# Patient Record
Sex: Female | Born: 1960 | Race: Black or African American | Hispanic: No | Marital: Married | Smoking: Never smoker
Health system: Southern US, Community
[De-identification: ages and names within clinical notes are randomized; demographics above are authoritative.]

## PROBLEM LIST (undated history)

## (undated) DIAGNOSIS — E119 Type 2 diabetes mellitus without complications: Secondary | ICD-10-CM

## (undated) DIAGNOSIS — E669 Obesity, unspecified: Secondary | ICD-10-CM

## (undated) DIAGNOSIS — R591 Generalized enlarged lymph nodes: Secondary | ICD-10-CM

## (undated) DIAGNOSIS — F419 Anxiety disorder, unspecified: Secondary | ICD-10-CM

## (undated) DIAGNOSIS — N926 Irregular menstruation, unspecified: Secondary | ICD-10-CM

## (undated) DIAGNOSIS — J189 Pneumonia, unspecified organism: Secondary | ICD-10-CM

## (undated) DIAGNOSIS — I10 Essential (primary) hypertension: Secondary | ICD-10-CM

## (undated) DIAGNOSIS — J342 Deviated nasal septum: Secondary | ICD-10-CM

## (undated) DIAGNOSIS — E785 Hyperlipidemia, unspecified: Secondary | ICD-10-CM

## (undated) DIAGNOSIS — N951 Menopausal and female climacteric states: Secondary | ICD-10-CM

## (undated) DIAGNOSIS — T7840XA Allergy, unspecified, initial encounter: Secondary | ICD-10-CM

## (undated) DIAGNOSIS — M1990 Unspecified osteoarthritis, unspecified site: Secondary | ICD-10-CM

## (undated) DIAGNOSIS — J45909 Unspecified asthma, uncomplicated: Secondary | ICD-10-CM

## (undated) HISTORY — DX: Obesity, unspecified: E66.9

## (undated) HISTORY — DX: Unspecified asthma, uncomplicated: J45.909

## (undated) HISTORY — DX: Anxiety disorder, unspecified: F41.9

## (undated) HISTORY — DX: Unspecified osteoarthritis, unspecified site: M19.90

## (undated) HISTORY — PX: DILATION AND CURETTAGE OF UTERUS: SHX78

## (undated) HISTORY — DX: Generalized enlarged lymph nodes: R59.1

## (undated) HISTORY — PX: CHOLECYSTECTOMY: SHX55

## (undated) HISTORY — DX: Irregular menstruation, unspecified: N92.6

## (undated) HISTORY — PX: BREAST SURGERY: SHX581

## (undated) HISTORY — DX: Hyperlipidemia, unspecified: E78.5

## (undated) HISTORY — DX: Menopausal and female climacteric states: N95.1

## (undated) HISTORY — PX: JOINT REPLACEMENT: SHX530

## (undated) HISTORY — DX: Deviated nasal septum: J34.2

## (undated) HISTORY — DX: Essential (primary) hypertension: I10

## (undated) HISTORY — PX: TONSILLECTOMY: SUR1361

## (undated) HISTORY — DX: Type 2 diabetes mellitus without complications: E11.9

## (undated) HISTORY — DX: Allergy, unspecified, initial encounter: T78.40XA

---

## 2005-09-29 ENCOUNTER — Ambulatory Visit: Payer: Self-pay | Admitting: Internal Medicine

## 2006-09-23 ENCOUNTER — Ambulatory Visit: Payer: Self-pay | Admitting: Obstetrics and Gynecology

## 2007-04-29 ENCOUNTER — Other Ambulatory Visit: Payer: Self-pay

## 2007-04-29 ENCOUNTER — Emergency Department: Payer: Self-pay | Admitting: Emergency Medicine

## 2009-02-16 ENCOUNTER — Inpatient Hospital Stay: Payer: Self-pay | Admitting: Internal Medicine

## 2009-05-09 ENCOUNTER — Ambulatory Visit: Payer: Self-pay | Admitting: Family Medicine

## 2009-10-06 ENCOUNTER — Ambulatory Visit: Payer: Self-pay | Admitting: Family Medicine

## 2009-11-18 ENCOUNTER — Ambulatory Visit: Payer: Self-pay | Admitting: Family Medicine

## 2010-02-25 NOTE — Assessment & Plan Note (Signed)
Summary: FLU SHOT/EVM   Vital Signs:  Patient Profile:   50 Years Old Female CC:      flu shot Temp:     98.6 degrees F oral                  Current Allergies (reviewed today): ! * SHELLFISHHistory of Present Illness Referral source: Dr.Soles  History from: patient Reason for visit: flu shot Chief Complaint: flu shot History of Present Illness: PCP: Dr.Soles at [facility]     Past History:  Past Medical History: Hypertension  Past Surgical History: Caesarean section:  1987 Caesarean section: 1989 Breast cyst : 1975  Family History: Father:  Mother:  Siblings:   Allergies (verified): 1)  ! * Shellfish  Assessment New Problems: HYPERTENSION (ICD-401.9)   Plan New Orders: Flu Vaccine 73yrs + [90658] Admin 1st Vaccine [90471] Admin 1st Vaccine [facility]) [phone]  The patient and/or caregiver has been counseled thoroughly with regard to medications prescribed including dosage, schedule, interactions, rationale for use, and possible side effects and they verbalize understanding.  Diagnoses and expected course of recovery discussed and will return if not improved as expected or if the condition worsens. Patient and/or caregiver verbalized understanding.   Orders Added: 1)  Flu Vaccine 27yrs + [90658] 2)  Admin 1st Vaccine [90471] 3)  Admin 1st Vaccine [facility]) [90471S]   Influenza Vaccine    Vaccine Type: Fluvax 3+    Site: left deltoid    Mfr: GlaxoSmithKline    Dose: 0.5 ml    Route: IM    Given by: Providence Crosby LPN    Exp. Date: 05/2010    Lot #: [id number]    VIS given: 08/20/09 version given October 06, 2009.  Flu Vaccine Consent Questions    Do you have a history of severe allergic reactions to this vaccine? no    Any prior history of allergic reactions to egg and/or gelatin? no    Do you have a sensitivity to the preservative Thimersol? no    Do you have a past history of Guillan-Barre Syndrome? no    Do you currently have an acute febrile  illness? no    Have you ever had a severe reaction to latex? no    Vaccine information given and explained to patient? yes    Are you currently pregnant? no

## 2010-11-06 DIAGNOSIS — E1165 Type 2 diabetes mellitus with hyperglycemia: Secondary | ICD-10-CM | POA: Insufficient documentation

## 2010-11-06 DIAGNOSIS — E1122 Type 2 diabetes mellitus with diabetic chronic kidney disease: Secondary | ICD-10-CM | POA: Insufficient documentation

## 2010-11-06 DIAGNOSIS — E1129 Type 2 diabetes mellitus with other diabetic kidney complication: Secondary | ICD-10-CM | POA: Insufficient documentation

## 2011-12-01 ENCOUNTER — Ambulatory Visit: Payer: Self-pay | Admitting: Family Medicine

## 2011-12-01 LAB — HM MAMMOGRAPHY: HM Mammogram: NORMAL

## 2012-05-27 LAB — HM PAP SMEAR: HM Pap smear: NORMAL

## 2013-01-26 DIAGNOSIS — J189 Pneumonia, unspecified organism: Secondary | ICD-10-CM

## 2013-01-26 HISTORY — DX: Pneumonia, unspecified organism: J18.9

## 2014-02-20 LAB — LIPID PANEL
Cholesterol: 258 mg/dL — AB (ref 0–200)
HDL: 40 mg/dL (ref 35–70)
LDL Cholesterol: 185 mg/dL
Triglycerides: 163 mg/dL — AB (ref 40–160)

## 2014-02-20 LAB — HEMOGLOBIN A1C: Hgb A1c MFr Bld: 6.9 % — AB (ref 4.0–6.0)

## 2014-08-13 ENCOUNTER — Encounter: Payer: Self-pay | Admitting: Family Medicine

## 2014-08-13 DIAGNOSIS — I10 Essential (primary) hypertension: Secondary | ICD-10-CM | POA: Insufficient documentation

## 2014-08-13 DIAGNOSIS — Z6841 Body Mass Index (BMI) 40.0 and over, adult: Secondary | ICD-10-CM | POA: Insufficient documentation

## 2014-08-13 DIAGNOSIS — Z8709 Personal history of other diseases of the respiratory system: Secondary | ICD-10-CM | POA: Insufficient documentation

## 2014-08-13 DIAGNOSIS — M1711 Unilateral primary osteoarthritis, right knee: Secondary | ICD-10-CM | POA: Insufficient documentation

## 2014-08-13 DIAGNOSIS — J302 Other seasonal allergic rhinitis: Secondary | ICD-10-CM | POA: Insufficient documentation

## 2014-08-13 DIAGNOSIS — I1A0 Resistant hypertension: Secondary | ICD-10-CM | POA: Insufficient documentation

## 2014-08-13 DIAGNOSIS — E785 Hyperlipidemia, unspecified: Secondary | ICD-10-CM | POA: Insufficient documentation

## 2014-08-13 DIAGNOSIS — E049 Nontoxic goiter, unspecified: Secondary | ICD-10-CM | POA: Insufficient documentation

## 2014-08-13 DIAGNOSIS — R809 Proteinuria, unspecified: Secondary | ICD-10-CM | POA: Insufficient documentation

## 2014-08-13 HISTORY — DX: Personal history of other diseases of the respiratory system: Z87.09

## 2014-08-14 ENCOUNTER — Ambulatory Visit (INDEPENDENT_AMBULATORY_CARE_PROVIDER_SITE_OTHER): Payer: 59 | Admitting: Family Medicine

## 2014-08-14 ENCOUNTER — Encounter (INDEPENDENT_AMBULATORY_CARE_PROVIDER_SITE_OTHER): Payer: Self-pay

## 2014-08-14 ENCOUNTER — Encounter: Payer: Self-pay | Admitting: Family Medicine

## 2014-08-14 VITALS — BP 142/82 | HR 90 | Temp 97.9°F | Resp 16 | Ht 65.0 in | Wt 290.4 lb

## 2014-08-14 DIAGNOSIS — I10 Essential (primary) hypertension: Secondary | ICD-10-CM

## 2014-08-14 DIAGNOSIS — J3089 Other allergic rhinitis: Secondary | ICD-10-CM

## 2014-08-14 DIAGNOSIS — R350 Frequency of micturition: Secondary | ICD-10-CM

## 2014-08-14 DIAGNOSIS — Z139 Encounter for screening, unspecified: Secondary | ICD-10-CM | POA: Diagnosis not present

## 2014-08-14 DIAGNOSIS — Z1211 Encounter for screening for malignant neoplasm of colon: Secondary | ICD-10-CM

## 2014-08-14 DIAGNOSIS — Z1239 Encounter for other screening for malignant neoplasm of breast: Secondary | ICD-10-CM

## 2014-08-14 DIAGNOSIS — R232 Flushing: Secondary | ICD-10-CM

## 2014-08-14 DIAGNOSIS — M797 Fibromyalgia: Secondary | ICD-10-CM

## 2014-08-14 DIAGNOSIS — E1129 Type 2 diabetes mellitus with other diabetic kidney complication: Secondary | ICD-10-CM | POA: Diagnosis not present

## 2014-08-14 DIAGNOSIS — E785 Hyperlipidemia, unspecified: Secondary | ICD-10-CM

## 2014-08-14 DIAGNOSIS — N951 Menopausal and female climacteric states: Secondary | ICD-10-CM | POA: Diagnosis not present

## 2014-08-14 DIAGNOSIS — R809 Proteinuria, unspecified: Secondary | ICD-10-CM

## 2014-08-14 LAB — POCT URINALYSIS DIPSTICK
BILIRUBIN UA: NEGATIVE
GLUCOSE UA: NEGATIVE
KETONES UA: NEGATIVE
LEUKOCYTES UA: NEGATIVE
Nitrite, UA: NEGATIVE
PH UA: 5
Protein, UA: 2000
Spec Grav, UA: 1.01
Urobilinogen, UA: 0.2

## 2014-08-14 LAB — POCT GLYCOSYLATED HEMOGLOBIN (HGB A1C): HEMOGLOBIN A1C: 6.4

## 2014-08-14 LAB — POCT UA - MICROALBUMIN: Microalbumin Ur, POC: 100 mg/L

## 2014-08-14 MED ORDER — METFORMIN HCL ER 500 MG PO TB24: 500.0000 mg | ORAL_TABLET | Freq: Every day | ORAL | Status: DC

## 2014-08-14 MED ORDER — METOPROLOL TARTRATE 50 MG PO TABS
50.0000 mg | ORAL_TABLET | Freq: Every day | ORAL | Status: DC
Start: 2014-08-14 — End: 2014-08-14

## 2014-08-14 MED ORDER — MONTELUKAST SODIUM 10 MG PO TABS
10.0000 mg | ORAL_TABLET | Freq: Every day | ORAL | Status: DC
Start: 2014-08-14 — End: 2015-05-15

## 2014-08-14 MED ORDER — DULOXETINE HCL 30 MG PO CPEP: 30.0000 mg | ORAL_CAPSULE | Freq: Every day | ORAL | Status: DC

## 2014-08-14 MED ORDER — OLMESARTAN-AMLODIPINE-HCTZ 40-10-25 MG PO TABS: 1.0000 | ORAL_TABLET | Freq: Every day | ORAL | Status: DC

## 2014-08-14 MED ORDER — ATORVASTATIN CALCIUM 40 MG PO TABS: 40.0000 mg | ORAL_TABLET | Freq: Every day | ORAL | Status: DC

## 2014-08-14 MED ORDER — METOPROLOL TARTRATE 50 MG PO TABS
50.0000 mg | ORAL_TABLET | Freq: Every day | ORAL | Status: DC
Start: 2014-08-14 — End: 2015-05-15

## 2014-08-14 MED ORDER — COENZYME Q10 100 MG PO CAPS: 100.0000 mg | ORAL_CAPSULE | Freq: Every day | ORAL | Status: DC

## 2014-08-14 MED ORDER — CIPROFLOXACIN HCL 250 MG PO TABS: 250.0000 mg | ORAL_TABLET | Freq: Two times a day (BID) | ORAL | Status: DC

## 2014-08-14 NOTE — Progress Notes (Signed)
Name: Claire Hansen   MRN: [id number]    DOB: 1960/02/19   Date:08/14/2014       Progress Note  Subjective  Chief Complaint  Chief Complaint  Patient presents with  . Medication Refill    6 month F/U  . Diabetes  . Hypertension    she stopped several bp meds due to when she checks bp being low and feeling dizzy only curently taking tribenzor  . Hyperlipidemia  . Urinary Tract Infection    frequent and burning pain when urinating onset 2 days    HPI  DMII: she is taking Metformin 500 mg ER, she has lost 4 lbs , glucose has improved , hgbA1C 6.4% now, continue current medication regiment. Denies polyphagia, polyuria or polydipsia.   HTN: she stopped taking Spironolactone because her cycles were irregular, she states she was also feeling dizzy. She is taking Tribenzor in am and Metoprolol at night, bp at home has been at goal around 130/80's  Hyperlipidemia: she stopped taking  Lipitor because of body aches.   Hot Flashes: night sweats, hot flashes and irregular cycles and mood swings  Patient Active Problem List   Diagnosis Date Noted  . Fibromyalgia syndrome 08/14/2014  . Anxiety state 08/13/2014  . Dyslipidemia 08/13/2014  . Deviated nasal septum 08/13/2014  . Goiter 08/13/2014  . History of asthma 08/13/2014  . Benign hypertension 08/13/2014  . Irregular bleeding 08/13/2014  . Microalbuminuria 08/13/2014  . Excess, menstruation 08/13/2014  . Obesity (BMI 30-39.9) 08/13/2014  . Osteoarthritis of right knee 08/13/2014  . Allergic rhinitis, seasonal 08/13/2014  . Female climacteric state 08/13/2014  . Diabetes mellitus with renal manifestation 11/06/2010    Past Surgical History  Procedure Laterality Date  . Breast surgery Right     Cyst removed  . Cesarean section      x2  . Dilation and curettage of uterus      Treatment of Metorrhagia and Endometrial Ablation  . Tonsillectomy    . Cholecystectomy      Family History  Problem Relation Age of Onset  .  Asthma Son     History   Social History  . Marital Status: Married    Spouse Name: N/A  . Number of Children: N/A  . Years of Education: N/A   Occupational History  . Not on file.   Social History Main Topics  . Smoking status: Never Smoker   . Smokeless tobacco: Never Used  . Alcohol Use: No  . Drug Use: No  . Sexual Activity:    Partners: Male   Other Topics Concern  . Not on file   Social History Narrative     Current outpatient prescriptions:  .  acetaminophen (TYLENOL) 500 MG tablet, Take 1 tablet by mouth 3 (three) times daily., Disp: , Rfl:  .  aspirin 81 MG tablet, Take 1 tablet by mouth daily., Disp: , Rfl:  .  atorvastatin (LIPITOR) 40 MG tablet, Take 1 tablet (40 mg total) by mouth daily., Disp: 90 tablet, Rfl: 1 .  Loratadine (CLARITIN) 10 MG CAPS, Take 1 capsule by mouth daily., Disp: , Rfl:  .  montelukast (SINGULAIR) 10 MG tablet, Take 1 tablet (10 mg total) by mouth daily., Disp: 90 tablet, Rfl: 1 .  Olmesartan-Amlodipine-HCTZ (TRIBENZOR) 40-10-25 MG TABS, Take 1 tablet by mouth daily., Disp: 90 tablet, Rfl: 1 .  DULoxetine (CYMBALTA) 30 MG capsule, Take 1 capsule (30 mg total) by mouth daily. First week and after that take two daily, Disp: 60  capsule, Rfl: 0 .  metFORMIN (GLUCOPHAGE-XR) 500 MG 24 hr tablet, Take 1 tablet (500 mg total) by mouth daily with breakfast., Disp: 90 tablet, Rfl: 1 .  metoprolol (LOPRESSOR) 50 MG tablet, Take 1 tablet (50 mg total) by mouth at bedtime., Disp: 90 tablet, Rfl: 1  No Known Allergies   ROS  Constitutional: Negative for fever or significant weight change.  Respiratory: Negative for cough and shortness of breath.   Cardiovascular: Negative for chest pain or palpitations.  Gastrointestinal: Negative for abdominal pain, no bowel changes.  Musculoskeletal: Negative for gait problem or joint swelling. She has muscle aches and joint aches Skin: Negative for rash.  Neurological: Negative for dizziness or headache.  No  other specific complaints in a complete review of systems (except as listed in HPI above). Objective  Filed Vitals:   08/14/14 0832 08/14/14 0834  BP:  142/82  Pulse:  90  Temp: 97.9 F (36.6 C) 97.9 F (36.6 C)  TempSrc: Oral   Resp:  16  Height:  5\' 5"  (1.651 m)  Weight:  290 lb 6.4 oz (131.725 kg)  SpO2:  96%    Body mass index is 48.33 kg/(m^2).  Physical Exam  Constitutional: Patient appears well-developed and well-nourished. Obese No distress.  Eyes:  No scleral icterus. PERL Neck: Normal range of motion. Neck supple. Cardiovascular: Normal rate, regular rhythm and normal heart sounds.  No murmur heard. No BLE edema. Pulmonary/Chest: Effort normal and breath sounds normal. No respiratory distress.  Abdominal: Soft.  There is no tenderness. Psychiatric: Patient has a normal mood and affect. behavior is normal. Judgment and thought content normal. Muscular skeletal: trigger point positive  Recent Results (from the past 2160 hour(s))  POCT UA - Microalbumin     Status: Abnormal   Collection Time: 08/14/14  8:30 AM  Result Value Ref Range   Microalbumin Ur, POC 100 mg/L   Creatinine, POC  mg/dL   Albumin/Creatinine Ratio, Urine, POC    POCT HgB A1C     Status: None   Collection Time: 08/14/14  8:31 AM  Result Value Ref Range   Hemoglobin A1C 6.4   POCT urinalysis dipstick     Status: Abnormal   Collection Time: 08/14/14  8:42 AM  Result Value Ref Range   Color, UA orange    Clarity, UA clear    Glucose, UA neg    Bilirubin, UA neg    Ketones, UA neg    Spec Grav, UA 1.010    Blood, UA trace    pH, UA 5.0    Protein, UA 2000    Urobilinogen, UA 0.2    Nitrite, UA neg    Leukocytes, UA Negative Negative   Diabetic Foot Exam - Simple   Simple Foot Form  Visual Inspection  No deformities, no ulcerations, no other skin breakdown bilaterally:  Yes  Sensation Testing  Intact to touch and monofilament testing bilaterally:  Yes  Pulse Check  Posterior Tibialis  and Dorsalis pulse intact bilaterally:  Yes  Comments       PHQ2/9: Depression screen PHQ 2/9 08/14/2014  Decreased Interest 0  Down, Depressed, Hopeless 0  PHQ - 2 Score 0    Fall Risk: Fall Risk  08/14/2014  Falls in the past year? No    Assessment & Plan  1. Type 2 diabetes mellitus with other diabetic kidney complication Discussed diet and exercise - POCT HgB A1C - POCT UA - Microalbumin - metFORMIN (GLUCOPHAGE-XR) 500 MG 24 hr tablet;  Take 1 tablet (500 mg total) by mouth daily with breakfast.  Dispense: 90 tablet; Refill: 1  2. Urinary frequency Start empirical therapy  - POCT urinalysis dipstick - Urine Culture; Future - ciprofloxacin (CIPRO) 250 MG tablet; Take 1 tablet (250 mg total) by mouth 2 (two) times daily.  Dispense: 6 tablet; Refill: 0  3. Microalbuminuria Urine micro is high at 100, needs to stop taking so much ibuprofen otc  4. Dyslipidemia Not taking Lipitor, resume medication pain did not improve after she stop Lipitor - Lipid Profile - Coenzyme Q10 100 MG capsule; Take 1 capsule (100 mg total) by mouth daily.  Dispense: 30 capsule; Refill: 3 - atorvastatin (LIPITOR) 40 MG tablet; Take 1 tablet (40 mg total) by mouth daily.  Dispense: 90 tablet; Refill: 1  5. Benign hypertension bp is slightly up, but at goal at home - Comprehensive Metabolic Panel (CMET) - Olmesartan-Amlodipine-HCTZ (TRIBENZOR) 40-10-25 MG TABS; Take 1 tablet by mouth daily.  Dispense: 90 tablet; Refill: 1 - metoprolol (LOPRESSOR) 50 MG tablet; Take 1 tablet (50 mg total) by mouth at bedtime.  Dispense: 90 tablet; Refill: 1  6. Fibromyalgia syndrome Discussed symptoms we will try Cymbalta - DULoxetine (CYMBALTA) 30 MG capsule; Take 1 capsule (30 mg total) by mouth daily. First week and after that take two daily  Dispense: 60 capsule; Refill: 0  7. Hot flashes Discussed symptoms of menopause - DULoxetine (CYMBALTA) 30 MG capsule; Take 1 capsule (30 mg total) by mouth daily.  First week and after that take two daily  Dispense: 60 capsule; Refill: 0 - TSH  8. Other allergic rhinitis  - montelukast (SINGULAIR) 10 MG tablet; Take 1 tablet (10 mg total) by mouth daily.  Dispense: 90 tablet; Refill: 1  9. Screening  - Nicotine/cotinine metabolites  10. Breast cancer screening  - MM Digital Screening; Future  11. Colon cancer screening  - Ambulatory referral to Gastroenterology

## 2014-08-14 NOTE — Patient Instructions (Signed)
Fibromyalgia Fibromyalgia is a disorder that is often misunderstood. It is associated with muscular pains and tenderness that comes and goes. It is often associated with fatigue and sleep disturbances. Though it tends to be long-lasting, fibromyalgia is not life-threatening. CAUSES  The exact cause of fibromyalgia is unknown. People with certain gene types are predisposed to developing fibromyalgia and other conditions. Certain factors can play a role as triggers, such as:  Spine disorders.  Arthritis.  Severe injury (trauma) and other physical stressors.  Emotional stressors. SYMPTOMS   The main symptom is pain and stiffness in the muscles and joints, which can vary over time.  Sleep and fatigue problems. Other related symptoms may include:  Bowel and bladder problems.  Headaches.  Visual problems.  Problems with odors and noises.  Depression or mood changes.  Painful periods (dysmenorrhea).  Dryness of the skin or eyes. DIAGNOSIS  There are no specific tests for diagnosing fibromyalgia. Patients can be diagnosed accurately from the specific symptoms they have. The diagnosis is made by determining that nothing else is causing the problems. TREATMENT  There is no cure. Management includes medicines and an active, healthy lifestyle. The goal is to enhance physical fitness, decrease pain, and improve sleep. HOME CARE INSTRUCTIONS   Only take over-the-counter or prescription medicines as directed by your caregiver. Sleeping pills, tranquilizers, and pain medicines may make your problems worse.  Low-impact aerobic exercise is very important and advised for treatment. At first, it may seem to make pain worse. Gradually increasing your tolerance will overcome this feeling.  Learning relaxation techniques and how to control stress will help you. Biofeedback, visual imagery, hypnosis, muscle relaxation, yoga, and meditation are all options.  Anti-inflammatory medicines and  physical therapy may provide short-term help.  Acupuncture or massage treatments may help.  Take muscle relaxant medicines as suggested by your caregiver.  Avoid stressful situations.  Plan a healthy lifestyle. This includes your diet, sleep, rest, exercise, and friends.  Find and practice a hobby you enjoy.  Join a fibromyalgia support group for interaction, ideas, and sharing advice. This may be helpful. SEEK MEDICAL CARE IF:  You are not having good results or improvement from your treatment. FOR MORE INFORMATION  National Fibromyalgia Association: www.fmaware.[place]: www.arthritis.org Document Released: 01/12/2005 Document Revised: 04/06/2011 Document Reviewed: 04/24/2009 [facility] Patient Information 2015 [place], [place]. This information is not intended to replace advice given to you by your health care provider. Make sure you discuss any questions you have with your health care provider. Menopause Menopause is the normal time of life when menstrual periods stop completely. Menopause is complete when you have missed 12 consecutive menstrual periods. It usually occurs between the ages of 76 years and 37 years. Very rarely does a woman develop menopause before the age of 21 years. At menopause, your ovaries stop producing the female hormones estrogen and progesterone. This can cause undesirable symptoms and also affect your health. Sometimes the symptoms may occur 4-5 years before the menopause begins. There is no relationship between menopause and:  Oral contraceptives.  Number of children you had.  Race.  The age your menstrual periods started (menarche). Heavy smokers and very thin women may develop menopause earlier in life. CAUSES  The ovaries stop producing the female hormones estrogen and progesterone.  Other causes include:  Surgery to remove both ovaries.  The ovaries stop functioning for no known reason.  Tumors of the pituitary gland in the  brain.  Medical disease that affects the ovaries and hormone  production.  Radiation treatment to the abdomen or pelvis.  Chemotherapy that affects the ovaries. SYMPTOMS   Hot flashes.  Night sweats.  Decrease in sex drive.  Vaginal dryness and thinning of the vagina causing painful intercourse.  Dryness of the skin and developing wrinkles.  Headaches.  Tiredness.  Irritability.  Memory problems.  Weight gain.  Bladder infections.  Hair growth of the face and chest.  Infertility. More serious symptoms include:  Loss of bone (osteoporosis) causing breaks (fractures).  Depression.  Hardening and narrowing of the arteries (atherosclerosis) causing heart attacks and strokes. DIAGNOSIS   When the menstrual periods have stopped for 12 straight months.  Physical exam.  Hormone studies of the blood. TREATMENT  There are many treatment choices and nearly as many questions about them. The decisions to treat or not to treat menopausal changes is an individual choice made with your health care provider. Your health care provider can discuss the treatments with you. Together, you can decide which treatment will work best for you. Your treatment choices may include:   Hormone therapy (estrogen and progesterone).  Non-hormonal medicines.  Treating the individual symptoms with medicine (for example antidepressants for depression).  Herbal medicines that may help specific symptoms.  Counseling by a psychiatrist or psychologist.  Group therapy.  Lifestyle changes including:  Eating healthy.  Regular exercise.  Limiting caffeine and alcohol.  Stress management and meditation.  No treatment. HOME CARE INSTRUCTIONS   Take the medicine your health care provider gives you as directed.  Get plenty of sleep and rest.  Exercise regularly.  Eat a diet that contains calcium (good for the bones) and soy products (acts like estrogen hormone).  Avoid alcoholic  beverages.  Do not smoke.  If you have hot flashes, dress in layers.  Take supplements, calcium, and vitamin D to strengthen bones.  You can use over-the-counter lubricants or moisturizers for vaginal dryness.  Group therapy is sometimes very helpful.  Acupuncture may be helpful in some cases. SEEK MEDICAL CARE IF:   You are not sure you are in menopause.  You are having menopausal symptoms and need advice and treatment.  You are still having menstrual periods after age 29 years.  You have pain with intercourse.  Menopause is complete (no menstrual period for 12 months) and you develop vaginal bleeding.  You need a referral to a specialist (gynecologist, psychiatrist, or psychologist) for treatment. SEEK IMMEDIATE MEDICAL CARE IF:   You have severe depression.  You have excessive vaginal bleeding.  You fell and think you have a broken bone.  You have pain when you urinate.  You develop leg or chest pain.  You have a fast pounding heart beat (palpitations).  You have severe headaches.  You develop vision problems.  You feel a lump in your breast.  You have abdominal pain or severe indigestion. Document Released: 04/04/2003 Document Revised: 09/14/2012 Document Reviewed: 08/11/2012 [facility] Patient Information 2015 [place], [place]. This information is not intended to replace advice given to you by your health care provider. Make sure you discuss any questions you have with your health care provider.

## 2014-08-24 ENCOUNTER — Telehealth: Payer: Self-pay | Admitting: Family Medicine

## 2014-08-24 ENCOUNTER — Other Ambulatory Visit: Payer: Self-pay

## 2014-08-24 DIAGNOSIS — E1129 Type 2 diabetes mellitus with other diabetic kidney complication: Secondary | ICD-10-CM

## 2014-08-24 NOTE — Telephone Encounter (Signed)
Ordered a A1C and gave patient a lab slip.

## 2014-08-24 NOTE — Telephone Encounter (Signed)
PT CAME IN WITH LAB ORDER AND SAID THAT A TEST FOR HBA1C WAS MISSING ON THE ORDER. PLEASE HAVE DR TO ADD AND REPRINT THE ORDER SO PATIENT CAN GO HAVE THE LABS DONE BEFORE HER NEXT APPT.

## 2014-08-28 NOTE — Telephone Encounter (Signed)
ERRENOUS °

## 2014-09-12 ENCOUNTER — Telehealth: Payer: Self-pay

## 2014-09-12 LAB — LIPID PANEL
CHOL/HDL RATIO: 7.7 ratio — AB (ref 0.0–4.4)
Cholesterol, Total: 286 mg/dL — ABNORMAL HIGH (ref 100–199)
HDL: 37 mg/dL — ABNORMAL LOW (ref >39)
LDL CALC: 195 mg/dL — AB (ref 0–99)
Triglycerides: 268 mg/dL — ABNORMAL HIGH (ref 0–149)
VLDL Cholesterol Cal: 54 mg/dL — ABNORMAL HIGH (ref 5–40)

## 2014-09-12 LAB — COMPREHENSIVE METABOLIC PANEL
ALK PHOS: 103 IU/L (ref 39–117)
ALT: 26 IU/L (ref 0–32)
AST: 18 IU/L (ref 0–40)
Albumin/Globulin Ratio: 1.7 (ref 1.1–2.5)
Albumin: 5 g/dL (ref 3.5–5.5)
BUN/Creatinine Ratio: 9 (ref 9–23)
BUN: 10 mg/dL (ref 6–24)
Bilirubin Total: 0.3 mg/dL (ref 0.0–1.2)
CO2: 24 mmol/L (ref 18–29)
CREATININE: 1.06 mg/dL — AB (ref 0.57–1.00)
Calcium: 10.2 mg/dL (ref 8.7–10.2)
Chloride: 99 mmol/L (ref 97–108)
GFR calc Af Amer: 70 mL/min/{1.73_m2} (ref >59)
GFR calc non Af Amer: 61 mL/min/{1.73_m2} (ref >59)
Globulin, Total: 3 g/dL (ref 1.5–4.5)
Glucose: 147 mg/dL — ABNORMAL HIGH (ref 65–99)
Potassium: 3.8 mmol/L (ref 3.5–5.2)
Sodium: 143 mmol/L (ref 134–144)
Total Protein: 8 g/dL (ref 6.0–8.5)

## 2014-09-12 LAB — TSH: TSH: 1.07 u[IU]/mL (ref 0.450–4.500)

## 2014-09-12 LAB — HEMOGLOBIN A1C
ESTIMATED AVERAGE GLUCOSE: 151 mg/dL
Hgb A1c MFr Bld: 6.9 % — ABNORMAL HIGH (ref 4.8–5.6)

## 2014-09-12 NOTE — Telephone Encounter (Signed)
-----   Message from Steele Sizer, MD sent at 09/12/2014  8:12 AM EDT ----- Lipid panel has gotten worse, she needs to resume statin therapy, if she is taking it daily we will need to increase dose to 80mg  daily  Fasting glucose elevated, should be below 140 fasting, normal kidney and liver function test TSH within normal limits hgbA1C is at goal, keep it below 7.0

## 2014-09-12 NOTE — Progress Notes (Signed)
Patient was not taking Cholesterol medication but verbalized understanding to resume statins daily.

## 2014-09-12 NOTE — Telephone Encounter (Signed)
Left vm for patient to return my call for lab results. 

## 2014-09-13 LAB — NICOTINE/COTININE METABOLITES
Cotinine: NOT DETECTED ng/mL
Nicotine: NOT DETECTED ng/mL

## 2014-09-13 NOTE — Progress Notes (Signed)
Pt.notified

## 2014-09-17 ENCOUNTER — Encounter: Payer: Self-pay | Admitting: Family Medicine

## 2014-09-17 ENCOUNTER — Ambulatory Visit (INDEPENDENT_AMBULATORY_CARE_PROVIDER_SITE_OTHER): Payer: 59 | Admitting: Family Medicine

## 2014-09-17 VITALS — BP 134/86 | HR 86 | Temp 98.4°F | Resp 16 | Ht 65.0 in | Wt 287.6 lb

## 2014-09-17 DIAGNOSIS — I10 Essential (primary) hypertension: Secondary | ICD-10-CM

## 2014-09-17 DIAGNOSIS — M797 Fibromyalgia: Secondary | ICD-10-CM | POA: Diagnosis not present

## 2014-09-17 DIAGNOSIS — E785 Hyperlipidemia, unspecified: Secondary | ICD-10-CM | POA: Diagnosis not present

## 2014-09-17 DIAGNOSIS — R232 Flushing: Secondary | ICD-10-CM

## 2014-09-17 DIAGNOSIS — N951 Menopausal and female climacteric states: Secondary | ICD-10-CM

## 2014-09-17 MED ORDER — DULOXETINE HCL 60 MG PO CPEP
60.0000 mg | ORAL_CAPSULE | Freq: Every day | ORAL | Status: DC
Start: 2014-09-17 — End: 2014-09-24

## 2014-09-17 NOTE — Progress Notes (Signed)
Name: Claire Hansen   MRN: [id number]    DOB: 1960-03-20   Date:09/17/2014       Progress Note  Subjective  Chief Complaint  Chief Complaint  Patient presents with  . Medication Management    1 month F/U  . Hot Flashes    Started Patient on Duloxetine, patient states her hot flashes are unchanged with medication.    HPI  Hot Flashes: she could not tolerate Duloxetine, she states she has hot flashes, night sweats, mood swings, all her symptoms started skipping cycles about 8 months ago.  She states Duloxetine made her feel too calm,  foggy minded and curbed her appetite, she states it helped with her body aches and helped a little with hot flashes.    Obesity: she lost a few lbs since last visit, she has not changed her diet or increased her physical activity  HTN: she is not taking Lopressor on a regular basis, but  bp has been at goal with Tribenzor  Hyperlipidemia: she resumed taking Atorvastatin  FMS: she states while she was taking Duloxetine her muscle aches resolved.    Patient Active Problem List   Diagnosis Date Noted  . Fibromyalgia syndrome 08/14/2014  . Anxiety state 08/13/2014  . Dyslipidemia 08/13/2014  . Deviated nasal septum 08/13/2014  . Goiter 08/13/2014  . History of asthma 08/13/2014  . Benign hypertension 08/13/2014  . Irregular bleeding 08/13/2014  . Microalbuminuria 08/13/2014  . Excess, menstruation 08/13/2014  . Obesity (BMI 30-39.9) 08/13/2014  . Osteoarthritis of right knee 08/13/2014  . Allergic rhinitis, seasonal 08/13/2014  . Female climacteric state 08/13/2014  . Diabetes mellitus with renal manifestation 11/06/2010    Past Surgical History  Procedure Laterality Date  . Breast surgery Right     Cyst removed  . Cesarean section      x2  . Dilation and curettage of uterus      Treatment of Metorrhagia and Endometrial Ablation  . Tonsillectomy    . Cholecystectomy      Family History  Problem Relation Age of Onset  . Asthma Son      Social History   Social History  . Marital Status: Married    Spouse Name: N/A  . Number of Children: N/A  . Years of Education: N/A   Occupational History  . Not on file.   Social History Main Topics  . Smoking status: Never Smoker   . Smokeless tobacco: Never Used  . Alcohol Use: No  . Drug Use: No  . Sexual Activity:    Partners: Male   Other Topics Concern  . Not on file   Social History Narrative     Current outpatient prescriptions:  .  acetaminophen (TYLENOL) 500 MG tablet, Take 1 tablet by mouth 3 (three) times daily., Disp: , Rfl:  .  aspirin 81 MG tablet, Take 1 tablet by mouth daily., Disp: , Rfl:  .  atorvastatin (LIPITOR) 40 MG tablet, Take 1 tablet (40 mg total) by mouth daily., Disp: 90 tablet, Rfl: 1 .  Coenzyme Q10 100 MG capsule, Take 1 capsule (100 mg total) by mouth daily., Disp: 30 capsule, Rfl: 3 .  Loratadine (CLARITIN) 10 MG CAPS, Take 1 capsule by mouth daily., Disp: , Rfl:  .  metFORMIN (GLUCOPHAGE-XR) 500 MG 24 hr tablet, Take 1 tablet (500 mg total) by mouth daily with breakfast., Disp: 90 tablet, Rfl: 1 .  metoprolol (LOPRESSOR) 50 MG tablet, Take 1 tablet (50 mg total) by mouth at bedtime., Disp:  90 tablet, Rfl: 1 .  montelukast (SINGULAIR) 10 MG tablet, Take 1 tablet (10 mg total) by mouth daily., Disp: 90 tablet, Rfl: 1 .  Olmesartan-Amlodipine-HCTZ (TRIBENZOR) 40-10-25 MG TABS, Take 1 tablet by mouth daily., Disp: 90 tablet, Rfl: 1  No Known Allergies   ROS  Constitutional: Negative for fever or significant weight change.  Respiratory: Positive  for cough no shortness of breath.   Cardiovascular: Negative for chest pain or palpitations.  Gastrointestinal: Negative for abdominal pain, no bowel changes.  Musculoskeletal: Negative for gait problem or joint swelling. Muscle aches Skin: Negative for rash.  Neurological: Negative for dizziness or headache.  No other specific complaints in a complete review of systems (except as listed  in HPI above).  Objective  Filed Vitals:   09/17/14 1036  BP: 134/86  Pulse: 86  Temp: 98.4 F (36.9 C)  TempSrc: Oral  Resp: 16  Height: 5\' 5"  (1.651 m)  Weight: 287 lb 9.6 oz (130.455 kg)  SpO2: 98%    Body mass index is 47.86 kg/(m^2).  Physical Exam Constitutional: Patient appears well-developed and well-nourished. Obese No distress.  HEENT: head atraumatic, normocephalic, pupils equal and reactive to light,, neck supple, throat within normal limits Cardiovascular: Normal rate, regular rhythm and normal heart sounds.  No murmur heard. No BLE edema. Pulmonary/Chest: Effort normal and breath sounds normal. No respiratory distress. Abdominal: Soft.  There is no tenderness. Psychiatric: Patient has a normal mood and affect. behavior is normal. Judgment and thought content normal Muscular Skeletal: trigger points positive 16   Recent Results (from the past 2160 hour(s))  POCT UA - Microalbumin     Status: Abnormal   Collection Time: 08/14/14  8:30 AM  Result Value Ref Range   Microalbumin Ur, POC 100 mg/L   Creatinine, POC  mg/dL   Albumin/Creatinine Ratio, Urine, POC    POCT HgB A1C     Status: None   Collection Time: 08/14/14  8:31 AM  Result Value Ref Range   Hemoglobin A1C 6.4   POCT urinalysis dipstick     Status: Abnormal   Collection Time: 08/14/14  8:42 AM  Result Value Ref Range   Color, UA orange    Clarity, UA clear    Glucose, UA neg    Bilirubin, UA neg    Ketones, UA neg    Spec Grav, UA 1.010    Blood, UA trace    pH, UA 5.0    Protein, UA 2000    Urobilinogen, UA 0.2    Nitrite, UA neg    Leukocytes, UA Negative Negative  Nicotine/cotinine metabolites     Status: None   Collection Time: 09/11/14  8:49 AM  Result Value Ref Range   Nicotine None Detected ng/mL    Comment: Nicotine levels greater than 2.0 are consistent with the use of tobacco or tobacco cessation products.    Cotinine None Detected ng/mL    Comment: Cotinine levels greater  than 20.0 are consistent with the use of tobacco or tobacco cessation products.   Lipid Profile     Status: Abnormal   Collection Time: 09/11/14  8:49 AM  Result Value Ref Range   Cholesterol, Total 286 (H) 100 - 199 mg/dL   Triglycerides 268 (H) 0 - 149 mg/dL   HDL 37 (L) >39 mg/dL    Comment: According to ATP-III Guidelines, HDL-C >59 mg/dL is considered a negative risk factor for CHD.    VLDL Cholesterol Cal 54 (H) 5 - 40 mg/dL  LDL Calculated 195 (H) 0 - 99 mg/dL   Comment: Comment     Comment: Possible Familial Hypercholesterolemia. FH should be suspected when fasting LDL cholesterol is above 189 mg/dL or non-HDL cholesterol is above 219 mg/dL. A family history of high cholesterol and heart disease in 1st degree relatives should be collected. J Clin Lipidol 2011;5:133-140    Chol/HDL Ratio 7.7 (H) 0.0 - 4.4 ratio units    Comment:                                   T. Chol/HDL Ratio                                             Men  Women                               1/2 Avg.Risk  3.4    3.3                                   Avg.Risk  5.0    4.4                                2X Avg.Risk  9.6    7.1                                3X Avg.Risk 23.4   11.0   Comprehensive Metabolic Panel (CMET)     Status: Abnormal   Collection Time: 09/11/14  8:49 AM  Result Value Ref Range   Glucose 147 (H) 65 - 99 mg/dL   BUN 10 6 - 24 mg/dL   Creatinine, Ser 1.06 (H) 0.57 - 1.00 mg/dL   GFR calc non Af Amer 61 >59 mL/min/1.73   GFR calc Af Amer 70 >59 mL/min/1.73   BUN/Creatinine Ratio 9 9 - 23   Sodium 143 134 - 144 mmol/L   Potassium 3.8 3.5 - 5.2 mmol/L   Chloride 99 97 - 108 mmol/L   CO2 24 18 - 29 mmol/L   Calcium 10.2 8.7 - 10.2 mg/dL   Total Protein 8.0 6.0 - 8.5 g/dL   Albumin 5.0 3.5 - 5.5 g/dL   Globulin, Total 3.0 1.5 - 4.5 g/dL   Albumin/Globulin Ratio 1.7 1.1 - 2.5   Bilirubin Total 0.3 0.0 - 1.2 mg/dL   Alkaline Phosphatase 103 39 - 117 IU/L   AST 18 0 - 40 IU/L    ALT 26 0 - 32 IU/L  TSH     Status: None   Collection Time: 09/11/14  8:49 AM  Result Value Ref Range   TSH 1.070 0.450 - 4.500 uIU/mL  Hemoglobin A1c     Status: Abnormal   Collection Time: 09/11/14  8:49 AM  Result Value Ref Range   Hgb A1c MFr Bld 6.9 (H) 4.8 - 5.6 %    Comment:          Pre-diabetes: 5.7 - 6.4          Diabetes: >6.4  Glycemic control for adults with diabetes: <7.0    Est. average glucose Bld gHb Est-mCnc 151 mg/dL     PHQ2/9: Depression screen PHQ 2/9 08/14/2014  Decreased Interest 0  Down, Depressed, Hopeless 0  PHQ - 2 Score 0     Fall Risk: Fall Risk  08/14/2014  Falls in the past year? No      Assessment & Plan  1. Dyslipidemia  She is back on statin therapy, explained importance of compliance  2. Fibromyalgia syndrome  She could not tolerate Duloxetine - DULoxetine (CYMBALTA) 60 MG capsule; Take 1 capsule (60 mg total) by mouth daily.  Dispense: 30 capsule; Refill: 2  3. Hot flashes  She did not tolerate Duloxetine, explained menopause and other options,  - DULoxetine (CYMBALTA) 60 MG capsule; Take 1 capsule (60 mg total) by mouth daily.  Dispense: 30 capsule; Refill: 2  4. Benign hypertension  Taking Tribenzor but not taking Lopressor daily, only when she has palpitation - advised to monitor bp and if stays within normal limits we will stop Lopressor

## 2014-09-21 ENCOUNTER — Telehealth: Payer: Self-pay | Admitting: Family Medicine

## 2014-09-21 NOTE — Telephone Encounter (Signed)
PT IS ASKING IF YOU COULD CHANGE THE DOSE OF THE DRUG DULOXETINE HCL ENTERICCOATE TO 30MG  AND YOU HAD IT FOR 60MG . PHARM IS [facility].

## 2014-09-24 ENCOUNTER — Other Ambulatory Visit: Payer: Self-pay | Admitting: Family Medicine

## 2014-09-24 DIAGNOSIS — R232 Flushing: Secondary | ICD-10-CM

## 2014-09-24 DIAGNOSIS — M797 Fibromyalgia: Secondary | ICD-10-CM

## 2014-09-24 MED ORDER — DULOXETINE HCL 30 MG PO CPEP: 30.0000 mg | ORAL_CAPSULE | Freq: Every day | ORAL | Status: DC

## 2014-10-29 ENCOUNTER — Ambulatory Visit: Payer: 59 | Admitting: Family Medicine

## 2015-03-22 ENCOUNTER — Encounter: Payer: 59 | Admitting: Family Medicine

## 2015-04-29 ENCOUNTER — Encounter: Payer: 59 | Admitting: Family Medicine

## 2015-05-15 ENCOUNTER — Ambulatory Visit (INDEPENDENT_AMBULATORY_CARE_PROVIDER_SITE_OTHER): Payer: 59 | Admitting: Nurse Practitioner

## 2015-05-15 ENCOUNTER — Encounter: Payer: Self-pay | Admitting: Nurse Practitioner

## 2015-05-15 VITALS — BP 168/88 | HR 95 | Temp 98.1°F | Ht 64.5 in | Wt 284.8 lb

## 2015-05-15 DIAGNOSIS — J3089 Other allergic rhinitis: Secondary | ICD-10-CM

## 2015-05-15 DIAGNOSIS — E669 Obesity, unspecified: Secondary | ICD-10-CM

## 2015-05-15 DIAGNOSIS — M1711 Unilateral primary osteoarthritis, right knee: Secondary | ICD-10-CM

## 2015-05-15 DIAGNOSIS — E1122 Type 2 diabetes mellitus with diabetic chronic kidney disease: Secondary | ICD-10-CM

## 2015-05-15 DIAGNOSIS — E049 Nontoxic goiter, unspecified: Secondary | ICD-10-CM

## 2015-05-15 DIAGNOSIS — I10 Essential (primary) hypertension: Secondary | ICD-10-CM

## 2015-05-15 DIAGNOSIS — J302 Other seasonal allergic rhinitis: Secondary | ICD-10-CM

## 2015-05-15 MED ORDER — MONTELUKAST SODIUM 10 MG PO TABS: 10.0000 mg | ORAL_TABLET | Freq: Every day | ORAL | Status: DC

## 2015-05-15 MED ORDER — DICLOFENAC SODIUM 75 MG PO TBEC: 75.0000 mg | DELAYED_RELEASE_TABLET | Freq: Two times a day (BID) | ORAL | Status: DC

## 2015-05-15 NOTE — Patient Instructions (Signed)
Singulair was refilled  Diclofenac twice daily for knee pain (use the smallest amount needed for the shortest time possible).   We will call to get you set up with orthopedics for your knee and the nutritionist.   [facility]! Nice to meet you.

## 2015-05-15 NOTE — Progress Notes (Signed)
Patient ID: Claire Hansen, female    DOB: Jun 13, 1960  Age: 55 y.o. MRN: [id number]  CC: Establish Care; Allergies; and Knee Pain   HPI [name] presents for establishing care at our practice- was established with [facility] of [facility].   CC of right knee pain with previous history of injections and refill needed for Singulair.   1) New Pt Info:   Immunizations- 2015 tdap?  Mammogram- unknown   Pap- unknown   Colonoscopy- cancelled   Last labs at [facility] 09/11/14   2) Chronic Problems-  Arthritis- Right knee bothering her See Acute  Diabetes- wanted to discuss prediabetes vs. diabetes  Allergies- stable  HTN/HLD- was on tribenzor and hydralazine, spironolactone   Thyroid diease- no medications at this time  3) Acute Problems-  Right knee inhibiting her exercise, saw a doctor in the past for injections. X-ray of right knee at some point pt reports showed OA Described as aching, walking difficult, rest helpful  x3 years, moderate pain     History Rileyann has a past medical history of Chronic osteoarthritis; Hyperlipidemia; Deviated septum; Peri-menopause; Irregular menstrual cycle; Hypertension; Obesity; Anxiety; Thyroid disorder; Allergy; Asthma; Lymphadenopathy; and Diabetes mellitus without complication ([place]).   She has past surgical history that includes Cesarean section; Dilation and curettage of uterus; Tonsillectomy; Cholecystectomy; and Breast surgery (Right).   Her family history includes Asthma in her son.She reports that she has never smoked. She has never used smokeless tobacco. She reports that she does not drink alcohol or use illicit drugs.  Outpatient Prescriptions Prior to Visit  Medication Sig Dispense Refill  . Loratadine (CLARITIN) 10 MG CAPS Take 1 capsule by mouth daily.    . metFORMIN (GLUCOPHAGE-XR) 500 MG 24 hr tablet Take 1 tablet (500 mg total) by mouth daily with breakfast. 90 tablet 1  . Olmesartan-Amlodipine-HCTZ  (TRIBENZOR) 40-10-25 MG TABS Take 1 tablet by mouth daily. 90 tablet 1  . montelukast (SINGULAIR) 10 MG tablet Take 1 tablet (10 mg total) by mouth daily. 90 tablet 1  . acetaminophen (TYLENOL) 500 MG tablet Take 1 tablet by mouth 3 (three) times daily. Reported on 05/15/2015    . aspirin 81 MG tablet Take 1 tablet by mouth daily.    Marland Kitchen atorvastatin (LIPITOR) 40 MG tablet Take 1 tablet (40 mg total) by mouth daily. 90 tablet 1  . Coenzyme Q10 100 MG capsule Take 1 capsule (100 mg total) by mouth daily. 30 capsule 3  . DULoxetine (CYMBALTA) 30 MG capsule Take 1 capsule (30 mg total) by mouth daily. 90 capsule 1  . metoprolol (LOPRESSOR) 50 MG tablet Take 1 tablet (50 mg total) by mouth at bedtime. 90 tablet 1   No facility-administered medications prior to visit.    ROS Review of Systems  Constitutional: Negative for fever, chills, diaphoresis, activity change, appetite change, fatigue and unexpected weight change.  Eyes: Negative for visual disturbance.  Respiratory: Negative for chest tightness and shortness of breath.   Cardiovascular: Negative for chest pain.  Gastrointestinal: Negative for nausea, vomiting and diarrhea.  Musculoskeletal: Positive for arthralgias. Negative for myalgias, back pain, joint swelling, gait problem, neck pain and neck stiffness.       Right knee pain  Neurological: Negative for headaches.  Psychiatric/Behavioral: Negative for suicidal ideas and sleep disturbance. The patient is not nervous/anxious.     Objective:  BP 168/88 mmHg  Pulse 95  Temp(Src) 98.1 F (36.7 C) (Oral)  Ht 5' 4.5" (1.638 m)  Wt 284 lb 12.8  oz (129.184 kg)  BMI 48.15 kg/m2  SpO2 98%  LMP 05/05/2015  Physical Exam  Constitutional: She is oriented to person, place, and time. She appears well-developed and well-nourished. No distress.  HENT:  Head: Normocephalic and atraumatic.  Right Ear: External ear normal.  Left Ear: External ear normal.  Cardiovascular: Normal rate, regular  rhythm and normal heart sounds.   Pulmonary/Chest: Effort normal and breath sounds normal. No respiratory distress. She has no wheezes. She has no rales. She exhibits no tenderness.  Abdominal:  Morbidly obese  Musculoskeletal: Normal range of motion. She exhibits edema and tenderness.  Right knee > left knee in size, slight effusion of right knee, decreased ROM due to pain, quad and iliopsoas 5/5 bilaterally, DTRs 2+ patellae  Neurological: She is alert and oriented to person, place, and time.  Skin: Skin is warm and dry. No rash noted. She is not diaphoretic.  Psychiatric: She has a normal mood and affect. Her behavior is normal. Judgment and thought content normal.   Assessment & Plan:   Jamie-Lee was seen today for establish care, allergies and knee pain.  Diagnoses and all orders for this visit:  Other allergic rhinitis -     montelukast (SINGULAIR) 10 MG tablet; Take 1 tablet (10 mg total) by mouth daily.  Benign hypertension -     Amb ref to Medical Nutrition Therapy-MNT  Goiter  Type 2 diabetes mellitus with chronic kidney disease, without long-term current use of insulin, unspecified CKD stage (HCC) -     Amb ref to Medical Nutrition Therapy-MNT  Primary osteoarthritis of right knee -     Ambulatory referral to Orthopedic Surgery  Obesity (BMI 30-39.9) -     Amb ref to Medical Nutrition Therapy-MNT  Other orders -     diclofenac (VOLTAREN) 75 MG EC tablet; Take 1 tablet (75 mg total) by mouth 2 (two) times daily.   I have discontinued Ms. Frazzini's aspirin, Coenzyme Q10, metoprolol, atorvastatin, and DULoxetine. I am also having her start on diclofenac. Additionally, I am having her maintain her acetaminophen, Loratadine, Olmesartan-Amlodipine-HCTZ, metFORMIN, and montelukast.  Meds ordered this encounter  Medications  . diclofenac (VOLTAREN) 75 MG EC tablet    Sig: Take 1 tablet (75 mg total) by mouth 2 (two) times daily.    Dispense:  60 tablet    Refill:  0     Order Specific Question:  Supervising Provider    Answer:  Deborra Medina L [[id number]]  . montelukast (SINGULAIR) 10 MG tablet    Sig: Take 1 tablet (10 mg total) by mouth daily.    Dispense:  90 tablet    Refill:  1    Order Specific Question:  Supervising Provider    Answer:  Crecencio Mc [[id number]]     Follow-up: Return in about 3 months (around 08/14/2015) for Follow up.

## 2015-05-19 NOTE — Assessment & Plan Note (Signed)
No surgery, endocrinologist, nor medications at this time

## 2015-05-19 NOTE — Assessment & Plan Note (Signed)
Gave handout for Low GI diet Discussed good foods to add to diet- veggies non-starchy ones and lower carb fruits  Referral for nutrition therapy

## 2015-05-19 NOTE — Assessment & Plan Note (Signed)
Lab Results  Component Value Date   HGBA1C 6.9* 09/11/2014   HGBA1C 6.4 08/14/2014   HGBA1C 6.9* 02/20/2014   Lab Results  Component Value Date   LDLCALC 195* 09/11/2014   CREATININE 1.06* 09/11/2014   DM- on metformin for insulin resistance and diabetes- discussed her diagnosis with her and the need for medication. We will send to DM education/nutritionist for teaching.

## 2015-05-19 NOTE — Assessment & Plan Note (Signed)
Wants to be referred to Ortho for repeated care and injections. Referral placed. Voltaren EC prn sent to pharmacy to try for knee pain. No other NSAIDs to be used w/ this  Tylenol okay

## 2015-05-19 NOTE — Assessment & Plan Note (Signed)
BP Readings from Last 3 Encounters:  05/15/15 168/88  09/17/14 134/86  08/14/14 142/82   Stable to elevated at this time. Pt reports compliance, will follow closely, on Tribenzor

## 2015-05-19 NOTE — Assessment & Plan Note (Signed)
Singulair sent to pharmacy

## 2015-05-19 NOTE — Assessment & Plan Note (Signed)
>>  ASSESSMENT AND PLAN FOR POORLY CONTROLLED DIABETES MELLITUS (HCC) WRITTEN ON 05/19/2015  9:47 PM BY DOSS, Oleh Genin, NP  Lab Results  Component Value Date   HGBA1C 6.9* 09/11/2014   HGBA1C 6.4 08/14/2014   HGBA1C 6.9* 02/20/2014   Lab Results  Component Value Date   LDLCALC 195* 09/11/2014   CREATININE 1.06* 09/11/2014   DM- on metformin for insulin resistance and diabetes- discussed her diagnosis with her and the need for medication. We will send to DM education/nutritionist for teaching.

## 2015-07-12 ENCOUNTER — Encounter: Payer: Self-pay | Admitting: Nurse Practitioner

## 2015-08-04 ENCOUNTER — Other Ambulatory Visit: Payer: Self-pay | Admitting: Family Medicine

## 2015-08-21 ENCOUNTER — Ambulatory Visit: Payer: 59 | Admitting: Nurse Practitioner

## 2015-08-21 ENCOUNTER — Encounter: Payer: Self-pay | Admitting: Family Medicine

## 2015-08-21 ENCOUNTER — Ambulatory Visit (INDEPENDENT_AMBULATORY_CARE_PROVIDER_SITE_OTHER): Payer: 59 | Admitting: Family Medicine

## 2015-08-21 DIAGNOSIS — I10 Essential (primary) hypertension: Secondary | ICD-10-CM | POA: Diagnosis not present

## 2015-08-21 DIAGNOSIS — M25512 Pain in left shoulder: Secondary | ICD-10-CM | POA: Insufficient documentation

## 2015-08-21 DIAGNOSIS — E785 Hyperlipidemia, unspecified: Secondary | ICD-10-CM

## 2015-08-21 DIAGNOSIS — M25562 Pain in left knee: Secondary | ICD-10-CM

## 2015-08-21 DIAGNOSIS — J3089 Other allergic rhinitis: Secondary | ICD-10-CM | POA: Diagnosis not present

## 2015-08-21 DIAGNOSIS — E1122 Type 2 diabetes mellitus with diabetic chronic kidney disease: Secondary | ICD-10-CM | POA: Diagnosis not present

## 2015-08-21 DIAGNOSIS — M5489 Other dorsalgia: Secondary | ICD-10-CM

## 2015-08-21 DIAGNOSIS — M549 Dorsalgia, unspecified: Secondary | ICD-10-CM | POA: Insufficient documentation

## 2015-08-21 MED ORDER — MONTELUKAST SODIUM 10 MG PO TABS: 10.0000 mg | ORAL_TABLET | Freq: Every day | ORAL | 2 refills | Status: DC

## 2015-08-21 MED ORDER — TRAMADOL HCL 50 MG PO TABS: 50.0000 mg | ORAL_TABLET | Freq: Two times a day (BID) | ORAL | 0 refills | Status: DC | PRN

## 2015-08-21 MED ORDER — OLMESARTAN-AMLODIPINE-HCTZ 40-10-25 MG PO TABS: 1.0000 | ORAL_TABLET | Freq: Every day | ORAL | 3 refills | Status: DC

## 2015-08-21 NOTE — Progress Notes (Signed)
Subjective:  Patient ID: Claire Hansen, female    DOB: 09/28/1960  Age: 55 y.o. MRN: [id number]  CC: Follow up  HPI:  55 year old female with Morbid obesity, DM-2, HTN, HLD presents for follow up.  Patient complains of knee pain, shoulder pain and back pain.  HTN  Uncontrolled.   Patient is currently taking Olmesartan/Norvasc/HCTZ.  She has also recently restarted other prior medicines - Spironolactone, Metoprolol, and Hydralazine.  She did not bring her medications with her today.  DM-2  Stable.  Most recent A1C is from 2016 - 6.9.  Compliant with Metformin.  HLD  Uncontrolled and untreated.  Most recent LDL 195.  Will discuss today.  Pain  Patient reports longstanding knee pain. Left knee pain worsening as of late.  Has had joint injection in the past.  Patient also reports recent left shoulder pain and L sided lower thoracic/lumbar pain (paraspinal region).  Exacerbated by activity.  No relieving factors.  Has been on NSAID's previously without significant improvement.   Social Hx   Social History   Social History  . Marital status: Married    Spouse name: N/A  . Number of children: N/A  . Years of education: N/A   Social History Main Topics  . Smoking status: Never Smoker  . Smokeless tobacco: Never Used  . Alcohol use No  . Drug use: No  . Sexual activity: Yes    Partners: Male   Other Topics Concern  . 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   Musculoskeletal: Positive for arthralgias and back pain.    Objective:  BP (!) 152/102 (BP Location: Left Arm, Patient Position: Sitting, Cuff Size: Large)   Pulse 96   Temp 98 F (36.7 C) (Oral)   Wt 274 lb (124.3 kg)   SpO2 95%   BMI 46.31 kg/m   BP/Weight 08/21/2015 05/15/2015 [id number]  Systolic BP 0000000 [id number]  Diastolic BP [id number] 88 86    Wt. (Lbs) 274 284.8 287.6  BMI 46.31 48.15 47.86   Physical Exam  Constitutional: She is oriented to person, place, and time.  Morbidly obese female in NAD.  Cardiovascular: Normal rate and regular rhythm.   Pulmonary/Chest: Effort normal. She has no wheezes. She has no rales.  Musculoskeletal:  Left knee with crepitus with ROM. Ligaments intact. No joint line tenderness. No effusion.  Left Shoulder - Decreased ROM. Normal cuff strength. No signs of impingement.  Neurological: She is alert and oriented to person, place, and time.  Psychiatric: She has a normal mood and affect.  Vitals reviewed.   Lab Results  Component Value Date   GLUCOSE 147 (H) 09/11/2014   CHOL 286 (H) 09/11/2014   TRIG 268 (H) 09/11/2014   HDL 37 (L) 09/11/2014   LDLCALC 195 (H) 09/11/2014   ALT 26 09/11/2014   AST 18 09/11/2014   NA 143 09/11/2014   K 3.8 09/11/2014   CL 99 09/11/2014   CREATININE 1.06 (H) 09/11/2014   BUN 10 09/11/2014   CO2 24 09/11/2014   TSH 1.070 09/11/2014   HGBA1C 6.9 (H) 09/11/2014    Assessment & Plan:   Problem List Items Addressed This Visit    Hyperlipidemia    Severely elevated LDL. Needs treatment. Advocated for this  today. Patient wants repeat labs prior to starting. Labs today.      Relevant Medications   Olmesartan-Amlodipine-HCTZ 40-10-25 MG TABS   Benign hypertension    Uncontrolled. Unclear of what patient is actually taking. Unsure of compliance. Patient to return with her medications. Follow up in 2 weeks. Labs today.      Relevant Medications   Olmesartan-Amlodipine-HCTZ 40-10-25 MG TABS   Diabetes mellitus with renal manifestation (HCC)    Controlled by last A1C. Obtaining A1C today. Continue metformin.      Relevant Medications   Olmesartan-Amlodipine-HCTZ 40-10-25 MG TABS   Left knee pain    New problem. Likely secondary to OA (in setting of morbid obesity). Treating with Tramadol.      Left shoulder pain    New  problem. Treating with Tramadol. Would benefit from PT.      Back pain    New problem. Likely MSK in nature. Treating with Tramadol.       Relevant Medications   traMADol (ULTRAM) 50 MG tablet    Other Visit Diagnoses    Other allergic rhinitis       Relevant Medications   montelukast (SINGULAIR) 10 MG tablet      Meds ordered this encounter  Medications  . Olmesartan-Amlodipine-HCTZ 40-10-25 MG TABS    Sig: Take 1 tablet by mouth daily.    Dispense:  90 tablet    Refill:  3  . traMADol (ULTRAM) 50 MG tablet    Sig: Take 1 tablet (50 mg total) by mouth every 12 (twelve) hours as needed.    Dispense:  90 tablet    Refill:  0  . montelukast (SINGULAIR) 10 MG tablet    Sig: Take 1 tablet (10 mg total) by mouth daily.    Dispense:  90 tablet    Refill:  2    Follow-up: 2 weeks for BP check, then 3 months.  Jolly

## 2015-08-21 NOTE — Assessment & Plan Note (Signed)
New problem. Likely secondary to OA (in setting of morbid obesity). Treating with Tramadol.

## 2015-08-21 NOTE — Assessment & Plan Note (Signed)
New problem. Likely MSK in nature. Treating with Tramadol.

## 2015-08-21 NOTE — Assessment & Plan Note (Signed)
New problem. Treating with Tramadol. Would benefit from PT.

## 2015-08-21 NOTE — Assessment & Plan Note (Signed)
Uncontrolled. Unclear of what patient is actually taking. Unsure of compliance. Patient to return with her medications. Follow up in 2 weeks. Labs today.

## 2015-08-21 NOTE — Patient Instructions (Addendum)
Use the Tramadol as needed for pain.  Continue your current medications.  We will call with the results of your labs.  Follow up in 2 weeks for a BP check. Follow up in 3 months with me (pending BP check).  Take care  Dr. Lacinda Axon

## 2015-08-21 NOTE — Assessment & Plan Note (Signed)
Severely elevated LDL. Needs treatment. Advocated for this today. Patient wants repeat labs prior to starting. Labs today.

## 2015-08-21 NOTE — Assessment & Plan Note (Signed)
>>  ASSESSMENT AND PLAN FOR POORLY CONTROLLED DIABETES MELLITUS (HCC) WRITTEN ON 08/21/2015 11:25 PM BY COOK, JAYCE G, DO  Controlled by last A1C. Obtaining A1C today. Continue metformin.

## 2015-08-21 NOTE — Assessment & Plan Note (Signed)
Controlled by last A1C. Obtaining A1C today. Continue metformin.

## 2015-09-12 ENCOUNTER — Other Ambulatory Visit: Payer: Self-pay | Admitting: Family Medicine

## 2015-09-13 LAB — HEPATIC FUNCTION PANEL
ALT: 22 U/L (ref 7–35)
AST: 14 U/L (ref 13–35)
Alkaline Phosphatase: 95 U/L (ref 25–125)
BILIRUBIN, TOTAL: 0.3 mg/dL

## 2015-09-13 LAB — COMPREHENSIVE METABOLIC PANEL
A/G RATIO: 1.5 (ref 1.2–2.2)
ALBUMIN: 4.5 g/dL (ref 3.5–5.5)
ALK PHOS: 95 IU/L (ref 39–117)
ALT: 22 IU/L (ref 0–32)
AST: 14 IU/L (ref 0–40)
BILIRUBIN TOTAL: 0.3 mg/dL (ref 0.0–1.2)
BUN / CREAT RATIO: 11 (ref 9–23)
BUN: 12 mg/dL (ref 6–24)
CHLORIDE: 97 mmol/L (ref 96–106)
CO2: 21 mmol/L (ref 18–29)
Calcium: 10.3 mg/dL — ABNORMAL HIGH (ref 8.7–10.2)
Creatinine, Ser: 1.06 mg/dL — ABNORMAL HIGH (ref 0.57–1.00)
GFR calc Af Amer: 69 mL/min/{1.73_m2} (ref >59)
GFR calc non Af Amer: 60 mL/min/{1.73_m2} (ref >59)
GLOBULIN, TOTAL: 3.1 g/dL (ref 1.5–4.5)
GLUCOSE: 148 mg/dL — AB (ref 65–99)
Potassium: 4.1 mmol/L (ref 3.5–5.2)
SODIUM: 139 mmol/L (ref 134–144)
TOTAL PROTEIN: 7.6 g/dL (ref 6.0–8.5)

## 2015-09-13 LAB — BASIC METABOLIC PANEL
BUN: 12 mg/dL (ref 4–21)
CREATININE: 1.1 mg/dL (ref 0.5–1.1)
GLUCOSE: 148 mg/dL
Potassium: 4.1 mmol/L (ref 3.4–5.3)

## 2015-09-13 LAB — LIPID PANEL W/O CHOL/HDL RATIO
CHOLESTEROL TOTAL: 295 mg/dL — AB (ref 100–199)
HDL: 36 mg/dL — ABNORMAL LOW (ref >39)
LDL Calculated: 196 mg/dL — ABNORMAL HIGH (ref 0–99)
TRIGLYCERIDES: 313 mg/dL — AB (ref 0–149)
VLDL Cholesterol Cal: 63 mg/dL — ABNORMAL HIGH (ref 5–40)

## 2015-09-13 LAB — MICROALBUMIN, URINE
MICROALB UR: 339
MICROALBUM., U, RANDOM: 339 ug/mL

## 2015-09-13 LAB — TSH
TSH: 1 u[IU]/mL (ref 0.450–4.500)
TSH: 1 u[IU]/mL (ref 0.41–5.90)

## 2015-09-13 LAB — LIPID PANEL
Cholesterol: 295 mg/dL — AB (ref 0–200)
HDL: 36 mg/dL (ref 35–70)
LDL Cholesterol: 196 mg/dL
Triglycerides: 313 mg/dL — AB (ref 40–160)

## 2015-09-13 LAB — HGB A1C W/O EAG: Hgb A1c MFr Bld: 7 % — ABNORMAL HIGH (ref 4.8–5.6)

## 2015-09-19 ENCOUNTER — Other Ambulatory Visit: Payer: Self-pay | Admitting: Family Medicine

## 2015-09-19 ENCOUNTER — Ambulatory Visit (INDEPENDENT_AMBULATORY_CARE_PROVIDER_SITE_OTHER): Payer: 59 | Admitting: Family Medicine

## 2015-09-19 ENCOUNTER — Telehealth: Payer: Self-pay | Admitting: *Deleted

## 2015-09-19 ENCOUNTER — Encounter: Payer: Self-pay | Admitting: Family Medicine

## 2015-09-19 DIAGNOSIS — I10 Essential (primary) hypertension: Secondary | ICD-10-CM

## 2015-09-19 DIAGNOSIS — E785 Hyperlipidemia, unspecified: Secondary | ICD-10-CM | POA: Diagnosis not present

## 2015-09-19 MED ORDER — ATORVASTATIN CALCIUM 40 MG PO TABS: 40.0000 mg | ORAL_TABLET | Freq: Every day | ORAL | 3 refills | Status: DC

## 2015-09-19 NOTE — Patient Instructions (Signed)
Take the medications as prescribed.  Start Lipitor 40 mg daily. We can check you liver enzymes in 4-6 weeks.  Follow up in 1 month.  Check your BP daily at home and keep a log.  Take care  Dr. Lacinda Axon

## 2015-09-19 NOTE — Telephone Encounter (Signed)
Patient requesting refill of Metoprolol be sent to [facility] in [place].

## 2015-09-19 NOTE — Progress Notes (Signed)
Subjective:  Patient ID: Claire Hansen, female    DOB: 10/11/1960  Age: 55 y.o. MRN: [id number]  CC: Follow up  HPI:  55 year old female with a complicated past medical history including difficult to control hypertension, hyperlipidemia, morbid obesity, DM 2 presents for follow up.   At our last visit in July she was instructed to follow-up in 2 weeks for a blood pressure check. It is now 1 month later. She presents for follow-up regarding her hypertension.  Hypertension remains uncontrolled. At her last visit he was difficult to discern which medication she was taking. She has her medications with her today. She is taking Tribenzor 40/10/25 daily, Hydralazine 10 mg (she is taking only once daily), Spironolactone 25 mg daily, Metoprolol tartrate 25 mg (she is taking 1/2 twice daily). No reports of chest pain, headaches, shortness of breath. She does note that she is under stress and experiencing pain of her knees particularly the left knee.  Hyperlipidemia: Recent labs returned with a markedly elevated LDL of 196. Needs treatment. Will discuss today.  Social Hx   Social History   Social History  . Marital status: Married    Spouse name: N/A  . Number of children: N/A  . Years of education: N/A   Social History Main Topics  . Smoking status: Never Smoker  . Smokeless tobacco: Never Used  . Alcohol use No  . Drug use: No  . Sexual activity: Yes    Partners: Male   Other Topics Concern  . 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   Musculoskeletal: Positive for arthralgias.   Objective:  BP (!) 173/104 (BP Location: Left Arm, Patient Position: Sitting, Cuff Size: Large) Comment (Cuff Size): thigh cuff  Pulse 93   Temp 98.3 F (36.8 C) (Oral)   Wt 273 lb (123.8 kg)   SpO2 95%   BMI 46.14 kg/m   BP/Weight 09/19/2015 08/21/2015  [id number]  Systolic BP A999333 0000000 [id number]  Diastolic BP 123456 A999333 88  Wt. (Lbs) 273 274 284.8  BMI 46.14 46.31 48.15   Physical Exam  Constitutional: She is oriented to person, place, and time. She appears well-developed. No distress.  Cardiovascular: Normal rate and regular rhythm.   Pulmonary/Chest: Effort normal. She has no wheezes. She has no rales.  Neurological: She is alert and oriented to person, place, and time.  Psychiatric: She has a normal mood and affect.  Vitals reviewed.  Lab Results  Component Value Date   GLUCOSE 147 (H) 09/11/2014   CHOL 286 (H) 09/11/2014   TRIG 268 (H) 09/11/2014   HDL 37 (L) 09/11/2014   LDLCALC 195 (H) 09/11/2014   ALT 26 09/11/2014   AST 18 09/11/2014   NA 143 09/11/2014   K 3.8 09/11/2014   CL 99 09/11/2014   CREATININE 1.06 (H) 09/11/2014   BUN 10 09/11/2014   CO2 24 09/11/2014   TSH 1.070 09/11/2014   HGBA1C 6.9 (H) 09/11/2014    Assessment & Plan:   Problem List Items Addressed This Visit    Hyperlipidemia    Established problem, worsening/uncontrolled.  Repeat labs revealed markedly elevated LDL. Starting Lipitor.       Relevant Medications   atorvastatin (LIPITOR) 40 MG tablet   hydrALAZINE (APRESOLINE) 10 MG tablet  spironolactone (ALDACTONE) 25 MG tablet   metoprolol tartrate (LOPRESSOR) 25 MG tablet   Resistant hypertension    Established problem, worsening/uncontrolled. Suspect noncompliance. Patient has had workup in the past per her report (for secondary HTN). Advise compliance with medications. Advised she take the hydralazine 3 times daily. Take a full dose of metoprolol twice daily. Follow up in 4-6 weeks.      Relevant Medications   atorvastatin (LIPITOR) 40 MG tablet   hydrALAZINE (APRESOLINE) 10 MG tablet   spironolactone (ALDACTONE) 25 MG tablet   metoprolol tartrate (LOPRESSOR) 25 MG tablet    Other Visit Diagnoses   None.     Meds ordered this encounter  Medications  . atorvastatin (LIPITOR) 40 MG  tablet    Sig: Take 1 tablet (40 mg total) by mouth daily.    Dispense:  90 tablet    Refill:  3  . hydrALAZINE (APRESOLINE) 10 MG tablet    Sig: Take 10 mg by mouth 3 (three) times daily.  Marland Kitchen spironolactone (ALDACTONE) 25 MG tablet    Sig: Take 25 mg by mouth daily.  . metoprolol tartrate (LOPRESSOR) 25 MG tablet    Sig: Take 25 mg by mouth 2 (two) times daily.    Follow-up: 4-6 weeks.  [place]

## 2015-09-19 NOTE — Telephone Encounter (Signed)
Patient requested a medication refill for

## 2015-09-19 NOTE — Assessment & Plan Note (Addendum)
Established problem, worsening/uncontrolled.  Repeat labs revealed markedly elevated LDL. Starting Lipitor.

## 2015-09-19 NOTE — Assessment & Plan Note (Addendum)
Established problem, worsening/uncontrolled. Suspect noncompliance. Patient has had workup in the past per her report (for secondary HTN). Advise compliance with medications. Advised she take the hydralazine 3 times daily. Take a full dose of metoprolol twice daily. Follow up in 4-6 weeks.

## 2015-09-23 ENCOUNTER — Encounter: Payer: Self-pay | Admitting: Family Medicine

## 2015-09-23 ENCOUNTER — Telehealth: Payer: Self-pay | Admitting: Family Medicine

## 2015-09-23 MED ORDER — SPIRONOLACTONE 25 MG PO TABS: 25.0000 mg | ORAL_TABLET | Freq: Every day | ORAL | 1 refills | Status: DC

## 2015-09-23 MED ORDER — METOPROLOL TARTRATE 25 MG PO TABS: 25.0000 mg | ORAL_TABLET | Freq: Two times a day (BID) | ORAL | 1 refills | Status: DC

## 2015-09-23 NOTE — Telephone Encounter (Signed)
Claire Hansen's husband, Truman Hayward called saying she needs Metoprolol and Sipronolactone sent to the [facility] pharmacy on [address]. She'll be out of the medications by Wednesday. Please give him a call if needed.  Lee's ph# [phone] Thank you.

## 2015-09-23 NOTE — Telephone Encounter (Signed)
Refilled, thanks

## 2015-11-01 ENCOUNTER — Other Ambulatory Visit: Payer: Self-pay | Admitting: Family Medicine

## 2015-11-01 DIAGNOSIS — Z1231 Encounter for screening mammogram for malignant neoplasm of breast: Secondary | ICD-10-CM

## 2015-11-28 ENCOUNTER — Ambulatory Visit
Admission: RE | Admit: 2015-11-28 | Discharge: 2015-11-28 | Disposition: A | Discharge status: Home / Self Care | Payer: 59 | Source: Ambulatory Visit | Attending: Family Medicine | Admitting: Family Medicine

## 2015-11-28 DIAGNOSIS — Z1231 Encounter for screening mammogram for malignant neoplasm of breast: Secondary | ICD-10-CM | POA: Diagnosis not present

## 2015-12-02 ENCOUNTER — Other Ambulatory Visit: Payer: Self-pay | Admitting: Obstetrics and Gynecology

## 2015-12-02 DIAGNOSIS — Z13820 Encounter for screening for osteoporosis: Secondary | ICD-10-CM

## 2015-12-02 DIAGNOSIS — Z1231 Encounter for screening mammogram for malignant neoplasm of breast: Secondary | ICD-10-CM

## 2016-02-21 ENCOUNTER — Other Ambulatory Visit: Payer: Self-pay | Admitting: Family Medicine

## 2016-02-21 ENCOUNTER — Encounter: Payer: Self-pay | Admitting: Family Medicine

## 2016-02-21 ENCOUNTER — Ambulatory Visit (INDEPENDENT_AMBULATORY_CARE_PROVIDER_SITE_OTHER): Payer: 59 | Admitting: Family Medicine

## 2016-02-21 ENCOUNTER — Ambulatory Visit (INDEPENDENT_AMBULATORY_CARE_PROVIDER_SITE_OTHER): Payer: 59

## 2016-02-21 VITALS — BP 192/112 | HR 92 | Temp 97.5°F | Wt 277.8 lb

## 2016-02-21 DIAGNOSIS — M1711 Unilateral primary osteoarthritis, right knee: Secondary | ICD-10-CM | POA: Diagnosis not present

## 2016-02-21 DIAGNOSIS — J3089 Other allergic rhinitis: Secondary | ICD-10-CM

## 2016-02-21 DIAGNOSIS — I10 Essential (primary) hypertension: Secondary | ICD-10-CM

## 2016-02-21 MED ORDER — ETODOLAC 500 MG PO TABS: 500.0000 mg | ORAL_TABLET | Freq: Two times a day (BID) | ORAL | 0 refills | Status: DC | PRN

## 2016-02-21 MED ORDER — MONTELUKAST SODIUM 10 MG PO TABS: 10.0000 mg | ORAL_TABLET | Freq: Every day | ORAL | 2 refills | Status: DC

## 2016-02-21 MED ORDER — METFORMIN HCL ER 500 MG PO TB24: 500.0000 mg | ORAL_TABLET | Freq: Every day | ORAL | 0 refills | Status: DC

## 2016-02-21 MED ORDER — OLMESARTAN-AMLODIPINE-HCTZ 40-10-25 MG PO TABS: 1.0000 | ORAL_TABLET | Freq: Every day | ORAL | 3 refills | Status: DC

## 2016-02-21 NOTE — Progress Notes (Signed)
Pre visit review using our clinic review tool, if applicable. No additional management support is needed unless otherwise documented below in the visit note. 

## 2016-02-21 NOTE — Patient Instructions (Signed)
Use the medication as needed.  We will obtain an xray today and will send you to orthopedics.  Follow up in 2 weeks regarding your BP (nurse visit). Only take the Tribenzor. Please bring your home readings with you.  Take care  Dr. Lacinda Axon

## 2016-02-21 NOTE — Progress Notes (Signed)
Subjective:  Patient ID: Claire Hansen, female    DOB: [birth year]  Age: 56 y.o. MRN: [id number]  CC: R knee pain, HTN  HPI:  56 year old female with obesity, HTN, HLD, DM-2 presents with the above issues.  HTN  BP severely elevated today.  She takes Tribenzor daily and has not been taking the other meds recently (Metoprolol, Hydralazine, and Spironolactone).   She states that her BP's are at goal at home.  BP elevates at doctors appts.  Right knee pain  Severe.  Worse with activity.  She's been taking tramadol without symptom improvement.  She's also been taking NSAIDs without improvement.  No known relieving factors.  Has a history of OA. Has had injection previously.  Wants to discuss medications and treatment options today.   Social Hx   Social History   Social History  . Marital status: Married    Spouse name: N/A  . Number of children: N/A  . Years of education: N/A   Social History Main Topics  . Smoking status: Never Smoker  . Smokeless tobacco: Never Used  . Alcohol use No  . Drug use: No  . Sexual activity: Yes    Partners: Male   Other Topics Concern  . 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   Musculoskeletal:       Right knee pain.   Objective:  BP (!) 192/112   Pulse 92   Temp 97.5 F (36.4 C) (Oral)   Wt 277 lb 12.8 oz (126 kg)   SpO2 99%   BMI 46.95 kg/m   BP/Weight 02/21/2016 09/19/2015 [id number]  Systolic BP [id number] A999333 0000000  Diastolic BP [id number] 123456 A999333  Wt. (Lbs) 277.8 273 274  BMI 46.95 46.14 46.31   Physical Exam  Constitutional: She is oriented to person, place, and time.  Morbidly obese female in no acute distress.  Cardiovascular: Normal rate and regular rhythm.   2/6 systolic murmur.  Musculoskeletal:  Knee: R knee Normal to inspection with no erythema or effusion or  obvious bony abnormalities.  Palpation with joint line tenderness.  Ligaments with solid consistent endpoints including ACL, PCL, LCL, MCL. Crepitus with extension/flexion of knee.    Neurological: She is alert and oriented to person, place, and time.  Psychiatric:  Very angry and argumentative today.  Raising voice and endorses displeasure with doctors/medical care.  Vitals reviewed.  Lab Results  Component Value Date   GLUCOSE 148 (H) 09/12/2015   CHOL 295 (A) 09/13/2015   TRIG 313 (A) 09/13/2015   HDL 36 09/13/2015   LDLCALC 196 09/13/2015   ALT 22 09/13/2015   AST 14 09/13/2015   NA 139 09/12/2015   K 4.1 09/13/2015   CL 97 09/12/2015   CREATININE 1.1 09/13/2015   BUN 12 09/13/2015   CO2 21 09/12/2015   TSH 1.00 09/13/2015   HGBA1C 7.0 (H) 09/12/2015   MICROALBUR 339 09/13/2015    Assessment & Plan:   Problem List Items Addressed This Visit    Resistant hypertension    BP elevated. This is likely secondary to compliance issues. I am not sure what she is taking and when she's taking it. Patient is to stop her antihypertensives except for Tribenzor. Recheck in 2 weeks. Will add back  medications if needed.      Relevant Medications   Olmesartan-Amlodipine-HCTZ 40-10-25 MG TABS   Osteoarthritis of right knee - Primary    Established problem, worsening. Patient very unhappy today. Very angry and argumentative. She states that she feels that no one was to help her and that everyone is "passing the buck".  I have treated her pain previously with tramadol. I was not aware that she was not doing well. She has not followed up with me or informed me of this. Discussed injection. Patient wanting medication. I informed her that I wouldn't do the injection and anti-inflammatory simultaneously today. Patient requesting anti-inflammatory. I informed her that she should use this sparingly given comorbidities and uncontrolled hypertension. Treating with etodolac or patient  request. X-ray today. Will likely need orthopedic referral.      Relevant Medications   etodolac (LODINE) 500 MG tablet   Other Relevant Orders   DG Knee Complete 4 Views Right    Other Visit Diagnoses    Other allergic rhinitis       Relevant Medications   montelukast (SINGULAIR) 10 MG tablet     Meds ordered this encounter  Medications  . Olmesartan-Amlodipine-HCTZ 40-10-25 MG TABS    Sig: Take 1 tablet by mouth daily.    Dispense:  90 tablet    Refill:  3  . montelukast (SINGULAIR) 10 MG tablet    Sig: Take 1 tablet (10 mg total) by mouth daily.    Dispense:  90 tablet    Refill:  2  . metFORMIN (GLUCOPHAGE-XR) 500 MG 24 hr tablet    Sig: Take 1 tablet (500 mg total) by mouth daily with breakfast.    Dispense:  90 tablet    Refill:  0  . etodolac (LODINE) 500 MG tablet    Sig: Take 1 tablet (500 mg total) by mouth 2 (two) times daily as needed.    Dispense:  60 tablet    Refill:  0    Follow-up: 2 weeks for BP check  [place]

## 2016-02-21 NOTE — Addendum Note (Signed)
Addended by: Carmin Muskrat on: 02/21/2016 10:06 AM   Modules accepted: Orders

## 2016-02-21 NOTE — Assessment & Plan Note (Signed)
BP elevated. This is likely secondary to compliance issues. I am not sure what she is taking and when she's taking it. Patient is to stop her antihypertensives except for Tribenzor. Recheck in 2 weeks. Will add back medications if needed.

## 2016-02-21 NOTE — Assessment & Plan Note (Signed)
Established problem, worsening. Patient very unhappy today. Very angry and argumentative. She states that she feels that no one was to help her and that everyone is "passing the buck".  I have treated her pain previously with tramadol. I was not aware that she was not doing well. She has not followed up with me or informed me of this. Discussed injection. Patient wanting medication. I informed her that I wouldn't do the injection and anti-inflammatory simultaneously today. Patient requesting anti-inflammatory. I informed her that she should use this sparingly given comorbidities and uncontrolled hypertension. Treating with etodolac or patient request. X-ray today. Will likely need orthopedic referral.

## 2016-02-24 ENCOUNTER — Other Ambulatory Visit: Payer: Self-pay | Admitting: Family Medicine

## 2016-02-24 DIAGNOSIS — M1711 Unilateral primary osteoarthritis, right knee: Secondary | ICD-10-CM

## 2016-02-28 ENCOUNTER — Other Ambulatory Visit: Payer: Self-pay | Admitting: Family Medicine

## 2016-04-10 ENCOUNTER — Other Ambulatory Visit: Payer: Self-pay | Admitting: Family Medicine

## 2016-06-11 ENCOUNTER — Telehealth: Payer: Self-pay | Admitting: Family Medicine

## 2016-06-11 NOTE — Telephone Encounter (Signed)
Why does she want to switch?

## 2016-06-11 NOTE — Telephone Encounter (Signed)
Pt states she wants to be established with female provider. She stated she was only able to see female provider at [facility] once and couldn't get back in with her.

## 2016-06-11 NOTE — Telephone Encounter (Signed)
Pt called requesting to change PCP to [facility]. Is this ok?

## 2016-06-11 NOTE — Telephone Encounter (Signed)
Fine with me

## 2016-11-04 ENCOUNTER — Encounter
Admission: RE | Admit: 2016-11-04 | Discharge: 2016-11-04 | Disposition: A | Discharge status: Home / Self Care | Payer: 59 | Source: Ambulatory Visit | Attending: Orthopedic Surgery | Admitting: Orthopedic Surgery

## 2016-11-04 HISTORY — DX: Pneumonia, unspecified organism: J18.9

## 2016-11-04 LAB — BASIC METABOLIC PANEL
Anion gap: 13 (ref 5–15)
BUN: 15 mg/dL (ref 6–20)
CALCIUM: 10.1 mg/dL (ref 8.9–10.3)
CHLORIDE: 102 mmol/L (ref 101–111)
CO2: 25 mmol/L (ref 22–32)
CREATININE: 0.97 mg/dL (ref 0.44–1.00)
GFR calc non Af Amer: >60 mL/min (ref >60)
Glucose, Bld: 143 mg/dL — ABNORMAL HIGH (ref 65–99)
Potassium: 3.7 mmol/L (ref 3.5–5.1)
Sodium: 140 mmol/L (ref 135–145)

## 2016-11-04 LAB — TYPE AND SCREEN
ABO/RH(D): O POS
ANTIBODY SCREEN: NEGATIVE

## 2016-11-04 LAB — CBC
HCT: 43.2 % (ref 35.0–47.0)
Hemoglobin: 14.9 g/dL (ref 12.0–16.0)
MCH: 29.6 pg (ref 26.0–34.0)
MCHC: 34.5 g/dL (ref 32.0–36.0)
MCV: 85.8 fL (ref 80.0–100.0)
PLATELETS: 295 10*3/uL (ref 150–440)
RBC: 5.04 MIL/uL (ref 3.80–5.20)
RDW: 15.9 % — ABNORMAL HIGH (ref 11.5–14.5)
WBC: 10.4 10*3/uL (ref 3.6–11.0)

## 2016-11-04 LAB — URINALYSIS, ROUTINE W REFLEX MICROSCOPIC
Bacteria, UA: NONE SEEN
Bilirubin Urine: NEGATIVE
GLUCOSE, UA: NEGATIVE mg/dL
Hgb urine dipstick: NEGATIVE
Ketones, ur: NEGATIVE mg/dL
Leukocytes, UA: NEGATIVE
NITRITE: NEGATIVE
PH: 5 (ref 5.0–8.0)
PROTEIN: 100 mg/dL — AB
Specific Gravity, Urine: 1.03 (ref 1.005–1.030)

## 2016-11-04 LAB — SURGICAL PCR SCREEN
MRSA, PCR: NEGATIVE
Staphylococcus aureus: NEGATIVE

## 2016-11-04 LAB — PROTIME-INR
INR: 1.02
Prothrombin Time: 13.3 seconds (ref 11.4–15.2)

## 2016-11-04 LAB — APTT: aPTT: 31 seconds (ref 24–36)

## 2016-11-04 LAB — SEDIMENTATION RATE: Sed Rate: 5 mm/hr (ref 0–30)

## 2016-11-04 MED ORDER — DEXTROSE 5 % IV SOLN
3.0000 g | Freq: Once | INTRAVENOUS | Status: DC
  Filled 2016-11-04: qty 3000

## 2016-11-04 MED ORDER — DEXTROSE 5 % IV SOLN: 3.0000 g | Freq: Once | INTRAVENOUS | Status: DC

## 2016-11-04 MED ORDER — TRANEXAMIC ACID 1000 MG/10ML IV SOLN
1000.0000 mg | INTRAVENOUS | Status: DC
  Filled 2016-11-04: qty 10

## 2016-11-04 NOTE — Pre-Procedure Instructions (Addendum)
EKG COMPARED WITH 02/16/09. UA FAXED TO DR [facility]

## 2016-11-04 NOTE — Patient Instructions (Addendum)
Your procedure is scheduled on: tomorrow Report to SDS. To find out your arrival time please call [phone] between 1PM - 3PM today.  Remember: Instructions that are not followed completely may result in serious medical risk, up to and including death, or upon the discretion of your surgeon and anesthesiologist your surgery may need to be rescheduled.     _X__ 1. Do not eat food after midnight the night before your procedure.                 No gum chewing or hard candies. You may drink clear liquids up to 2 hours                 before you are scheduled to arrive for your surgery- DO not drink clear                 liquids within 2 hours of the start of your surgery.                 Clear Liquids include:  water.     ___ 2.  No Alcohol for 24 hours before or after surgery.   ___ 3.  Do Not Smoke or use e-cigarettes For 24 Hours Prior to Your Surgery.                 Do not use any chewable tobacco products for at least 6 hours prior to                 surgery.  ____  4.  Bring all medications with you on the day of surgery if instructed.   _x___  5.  Notify your doctor if there is any change in your medical condition      (cold, fever, infections).     Do not wear jewelry, make-up, hairpins, clips or nail polish. Do not wear lotions, powders, or perfumes. You may wear deodorant. Do not shave 48 hours prior to surgery. Men may shave face and neck. Do not bring valuables to the hospital.    [facility] is not responsible for any belongings or valuables.  Contacts, dentures or bridgework may not be worn into surgery. Leave your suitcase in the car. After surgery it may be brought to your room. For patients admitted to the hospital, discharge time is determined by your treatment team.   Patients discharged the day of surgery will not be allowed to drive home.   Please read over the following fact sheets that you were given:   Preparing for  Surgery    __x__ Take these medicines the morning of surgery with A SIP OF WATER:    1. hydrALAZINE (APRESOLINE)  2. metoprolol succinate (TOPROL-XL)  3. acetaminophen (TYLENOL)  4.traMADol (ULTRAM) optional    ____ Fleet Enema (as directed)   __x__ Use CHG Soap as directed  _x___ Use inhalers on the day of surgery  _x___ Stop metformin now.     ____ Take 1/2 of usual insulin dose the night before surgery. No insulin the morning          of surgery.   ____ Stop Coumadin/Plavix/aspirin on does not apply.  _x__ Stop Anti-inflammatories:ibuprofen (ADVIL,MOTRIN),  meloxicam (MOBIC) on    ____ Stop supplements until after surgery.    ____ Bring C-Pap to the hospital.

## 2016-11-05 ENCOUNTER — Inpatient Hospital Stay: Payer: 59 | Admitting: Anesthesiology

## 2016-11-05 ENCOUNTER — Inpatient Hospital Stay: Payer: 59

## 2016-11-05 ENCOUNTER — Encounter: Payer: Self-pay | Admitting: Anesthesiology

## 2016-11-05 ENCOUNTER — Inpatient Hospital Stay
Admission: RE | Admit: 2016-11-05 | Discharge: 2016-11-07 | DRG: 470 | Disposition: A | Discharge status: Home / Self Care | Payer: 59 | Source: Ambulatory Visit | Attending: Orthopedic Surgery | Admitting: Orthopedic Surgery

## 2016-11-05 ENCOUNTER — Encounter
Admission: RE | Disposition: A | Discharge status: Home / Self Care | Payer: Self-pay | Source: Ambulatory Visit | Attending: Orthopedic Surgery

## 2016-11-05 DIAGNOSIS — Z6841 Body Mass Index (BMI) 40.0 and over, adult: Secondary | ICD-10-CM | POA: Diagnosis not present

## 2016-11-05 DIAGNOSIS — E876 Hypokalemia: Secondary | ICD-10-CM | POA: Diagnosis present

## 2016-11-05 DIAGNOSIS — G8918 Other acute postprocedural pain: Secondary | ICD-10-CM

## 2016-11-05 DIAGNOSIS — M1611 Unilateral primary osteoarthritis, right hip: Secondary | ICD-10-CM | POA: Diagnosis present

## 2016-11-05 DIAGNOSIS — E119 Type 2 diabetes mellitus without complications: Secondary | ICD-10-CM | POA: Diagnosis present

## 2016-11-05 DIAGNOSIS — Z419 Encounter for procedure for purposes other than remedying health state, unspecified: Secondary | ICD-10-CM

## 2016-11-05 HISTORY — PX: TOTAL HIP ARTHROPLASTY: SHX124

## 2016-11-05 LAB — URINE CULTURE: SPECIAL REQUESTS: NORMAL

## 2016-11-05 LAB — CBC
HCT: 39.8 % (ref 35.0–47.0)
Hemoglobin: 13.1 g/dL (ref 12.0–16.0)
MCH: 28.6 pg (ref 26.0–34.0)
MCHC: 32.9 g/dL (ref 32.0–36.0)
MCV: 86.9 fL (ref 80.0–100.0)
PLATELETS: 309 10*3/uL (ref 150–440)
RBC: 4.58 MIL/uL (ref 3.80–5.20)
RDW: 16 % — ABNORMAL HIGH (ref 11.5–14.5)
WBC: 19.4 10*3/uL — ABNORMAL HIGH (ref 3.6–11.0)

## 2016-11-05 LAB — ABO/RH: ABO/RH(D): O POS

## 2016-11-05 LAB — CREATININE, SERUM
CREATININE: 1.14 mg/dL — AB (ref 0.44–1.00)
GFR calc Af Amer: >60 mL/min (ref >60)
GFR calc non Af Amer: 54 mL/min — ABNORMAL LOW (ref >60)

## 2016-11-05 LAB — GLUCOSE, CAPILLARY
GLUCOSE-CAPILLARY: 162 mg/dL — AB (ref 65–99)
Glucose-Capillary: 173 mg/dL — ABNORMAL HIGH (ref 65–99)
Glucose-Capillary: 224 mg/dL — ABNORMAL HIGH (ref 65–99)
Glucose-Capillary: 234 mg/dL — ABNORMAL HIGH (ref 65–99)

## 2016-11-05 LAB — POCT PREGNANCY, URINE: Preg Test, Ur: NEGATIVE

## 2016-11-05 SURGERY — ARTHROPLASTY, HIP, TOTAL, ANTERIOR APPROACH
Anesthesia: Spinal | Site: Hip | Laterality: Right | Wound class: Clean

## 2016-11-05 MED ORDER — BISACODYL 10 MG RE SUPP
10.0000 mg | Freq: Every day | RECTAL | Status: DC | PRN
Start: 2016-11-05 — End: 2016-11-07

## 2016-11-05 MED ORDER — PROPOFOL 500 MG/50ML IV EMUL
INTRAVENOUS | Status: DC | PRN
  Administered 2016-11-05: 75 ug/kg/min via INTRAVENOUS

## 2016-11-05 MED ORDER — FENTANYL CITRATE (PF) 100 MCG/2ML IJ SOLN
INTRAMUSCULAR | Status: DC | PRN
  Administered 2016-11-05: 100 ug via INTRAVENOUS

## 2016-11-05 MED ORDER — DOCUSATE SODIUM 100 MG PO CAPS
100.0000 mg | ORAL_CAPSULE | Freq: Two times a day (BID) | ORAL | Status: DC
  Administered 2016-11-05 – 2016-11-06 (×2): 100 mg via ORAL
  Filled 2016-11-05 (×4): qty 1

## 2016-11-05 MED ORDER — LACTATED RINGERS IV SOLN
INTRAVENOUS | Status: DC | PRN
  Administered 2016-11-05 (×2): via INTRAVENOUS

## 2016-11-05 MED ORDER — SODIUM CHLORIDE 0.9 % IV SOLN
INTRAVENOUS | Status: DC
  Administered 2016-11-05: 14:00:00 via INTRAVENOUS

## 2016-11-05 MED ORDER — EPHEDRINE SULFATE 50 MG/ML IJ SOLN
INTRAMUSCULAR | Status: DC | PRN
  Administered 2016-11-05 (×2): 10 mg via INTRAVENOUS

## 2016-11-05 MED ORDER — METOCLOPRAMIDE HCL 5 MG/ML IJ SOLN: 5.0000 mg | Freq: Three times a day (TID) | INTRAMUSCULAR | Status: DC | PRN

## 2016-11-05 MED ORDER — FENTANYL CITRATE (PF) 100 MCG/2ML IJ SOLN
INTRAMUSCULAR | Status: AC
  Filled 2016-11-05: qty 2

## 2016-11-05 MED ORDER — PROPOFOL 500 MG/50ML IV EMUL
INTRAVENOUS | Status: AC
  Filled 2016-11-05: qty 50

## 2016-11-05 MED ORDER — METHOCARBAMOL 500 MG PO TABS
500.0000 mg | ORAL_TABLET | Freq: Four times a day (QID) | ORAL | Status: DC | PRN
  Administered 2016-11-05: 500 mg via ORAL
  Filled 2016-11-05: qty 1

## 2016-11-05 MED ORDER — MAGNESIUM HYDROXIDE 400 MG/5ML PO SUSP
30.0000 mL | Freq: Every day | ORAL | Status: DC | PRN
  Administered 2016-11-06: 30 mL via ORAL
  Filled 2016-11-05: qty 30

## 2016-11-05 MED ORDER — SPIRONOLACTONE 25 MG PO TABS
25.0000 mg | ORAL_TABLET | Freq: Every day | ORAL | Status: DC
  Administered 2016-11-05 – 2016-11-07 (×3): 25 mg via ORAL
  Filled 2016-11-05 (×3): qty 1

## 2016-11-05 MED ORDER — ALBUTEROL SULFATE HFA 108 (90 BASE) MCG/ACT IN AERS
INHALATION_SPRAY | RESPIRATORY_TRACT | Status: DC | PRN
  Administered 2016-11-05: 4 via RESPIRATORY_TRACT

## 2016-11-05 MED ORDER — METFORMIN HCL ER 500 MG PO TB24
500.0000 mg | ORAL_TABLET | Freq: Every day | ORAL | Status: DC
  Administered 2016-11-06 – 2016-11-07 (×2): 500 mg via ORAL
  Filled 2016-11-05 (×2): qty 1

## 2016-11-05 MED ORDER — PHENOL 1.4 % MT LIQD
1.0000 | OROMUCOSAL | Status: DC | PRN
  Filled 2016-11-05: qty 177

## 2016-11-05 MED ORDER — LORATADINE 10 MG PO TABS
10.0000 mg | ORAL_TABLET | Freq: Every day | ORAL | Status: DC | PRN
Start: 2016-11-05 — End: 2016-11-07

## 2016-11-05 MED ORDER — FENTANYL CITRATE (PF) 100 MCG/2ML IJ SOLN
25.0000 ug | INTRAMUSCULAR | Status: DC | PRN
  Administered 2016-11-05 (×2): 25 ug via INTRAVENOUS

## 2016-11-05 MED ORDER — ENOXAPARIN SODIUM 40 MG/0.4ML ~~LOC~~ SOLN: 40.0000 mg | SUBCUTANEOUS | Status: DC

## 2016-11-05 MED ORDER — ACETAMINOPHEN 650 MG RE SUPP: 650.0000 mg | Freq: Four times a day (QID) | RECTAL | Status: DC | PRN

## 2016-11-05 MED ORDER — METHOCARBAMOL 1000 MG/10ML IJ SOLN
500.0000 mg | Freq: Four times a day (QID) | INTRAVENOUS | Status: DC | PRN
  Filled 2016-11-05: qty 5

## 2016-11-05 MED ORDER — ONDANSETRON HCL 4 MG/2ML IJ SOLN: 4.0000 mg | Freq: Four times a day (QID) | INTRAMUSCULAR | Status: DC | PRN

## 2016-11-05 MED ORDER — OLMESARTAN-AMLODIPINE-HCTZ 40-10-25 MG PO TABS: 1.0000 | ORAL_TABLET | Freq: Every day | ORAL | Status: DC

## 2016-11-05 MED ORDER — PROMETHAZINE HCL 25 MG/ML IJ SOLN: 6.2500 mg | INTRAMUSCULAR | Status: DC | PRN

## 2016-11-05 MED ORDER — DIPHENHYDRAMINE HCL 12.5 MG/5ML PO ELIX: 12.5000 mg | ORAL_SOLUTION | ORAL | Status: DC | PRN

## 2016-11-05 MED ORDER — ENOXAPARIN SODIUM 40 MG/0.4ML ~~LOC~~ SOLN
40.0000 mg | Freq: Two times a day (BID) | SUBCUTANEOUS | Status: DC
  Administered 2016-11-06 – 2016-11-07 (×3): 40 mg via SUBCUTANEOUS
  Filled 2016-11-05 (×3): qty 0.4

## 2016-11-05 MED ORDER — ONDANSETRON HCL 4 MG/2ML IJ SOLN
INTRAMUSCULAR | Status: AC
  Filled 2016-11-05: qty 2

## 2016-11-05 MED ORDER — MENTHOL 3 MG MT LOZG
1.0000 | LOZENGE | OROMUCOSAL | Status: DC | PRN
  Filled 2016-11-05: qty 9

## 2016-11-05 MED ORDER — NEOMYCIN-POLYMYXIN B GU 40-200000 IR SOLN
Status: DC | PRN
Start: 2016-11-05 — End: 2016-11-05
  Administered 2016-11-05: 4 mL

## 2016-11-05 MED ORDER — BUPIVACAINE-EPINEPHRINE (PF) 0.25% -1:200000 IJ SOLN
INTRAMUSCULAR | Status: DC | PRN
  Administered 2016-11-05: 30 mL

## 2016-11-05 MED ORDER — FENTANYL CITRATE (PF) 100 MCG/2ML IJ SOLN
INTRAMUSCULAR | Status: AC
  Administered 2016-11-05: 25 ug via INTRAVENOUS
  Filled 2016-11-05: qty 2

## 2016-11-05 MED ORDER — LIDOCAINE HCL (PF) 2 % IJ SOLN
INTRAMUSCULAR | Status: AC
  Filled 2016-11-05: qty 10

## 2016-11-05 MED ORDER — ALUM & MAG HYDROXIDE-SIMETH 200-200-20 MG/5ML PO SUSP: 30.0000 mL | ORAL | Status: DC | PRN

## 2016-11-05 MED ORDER — FAMOTIDINE 20 MG PO TABS
20.0000 mg | ORAL_TABLET | Freq: Once | ORAL | Status: AC
  Administered 2016-11-05: 20 mg via ORAL

## 2016-11-05 MED ORDER — FENTANYL CITRATE (PF) 100 MCG/2ML IJ SOLN: 25.0000 ug | INTRAMUSCULAR | Status: DC

## 2016-11-05 MED ORDER — INSULIN ASPART 100 UNIT/ML ~~LOC~~ SOLN
0.0000 [IU] | Freq: Three times a day (TID) | SUBCUTANEOUS | Status: DC
  Administered 2016-11-05: 5 [IU] via SUBCUTANEOUS
  Administered 2016-11-06 (×3): 3 [IU] via SUBCUTANEOUS
  Administered 2016-11-07: 2 [IU] via SUBCUTANEOUS
  Administered 2016-11-07: 3 [IU] via SUBCUTANEOUS
  Filled 2016-11-05 (×6): qty 1

## 2016-11-05 MED ORDER — MONTELUKAST SODIUM 10 MG PO TABS: 10.0000 mg | ORAL_TABLET | Freq: Every day | ORAL | Status: DC | PRN

## 2016-11-05 MED ORDER — MAGNESIUM CITRATE PO SOLN
1.0000 | Freq: Once | ORAL | Status: DC | PRN
  Filled 2016-11-05: qty 296

## 2016-11-05 MED ORDER — CEFAZOLIN SODIUM 10 G IJ SOLR
2.0000 g | Freq: Four times a day (QID) | INTRAMUSCULAR | Status: AC
  Administered 2016-11-05 – 2016-11-06 (×3): 2 g via INTRAVENOUS
  Filled 2016-11-05 (×4): qty 2000

## 2016-11-05 MED ORDER — OXYCODONE HCL 5 MG PO TABS
5.0000 mg | ORAL_TABLET | ORAL | Status: DC | PRN
  Administered 2016-11-05: 5 mg via ORAL
  Administered 2016-11-05: 10 mg via ORAL
  Administered 2016-11-05: 5 mg via ORAL
  Administered 2016-11-06 (×3): 10 mg via ORAL
  Filled 2016-11-05 (×2): qty 2
  Filled 2016-11-05: qty 1
  Filled 2016-11-05 (×3): qty 2
  Filled 2016-11-05: qty 1

## 2016-11-05 MED ORDER — MIDAZOLAM HCL 5 MG/5ML IJ SOLN
INTRAMUSCULAR | Status: DC | PRN
  Administered 2016-11-05 (×2): 2 mg via INTRAVENOUS

## 2016-11-05 MED ORDER — NEOMYCIN-POLYMYXIN B GU 40-200000 IR SOLN
Status: AC
  Filled 2016-11-05: qty 4

## 2016-11-05 MED ORDER — ACETAMINOPHEN 325 MG PO TABS: 650.0000 mg | ORAL_TABLET | Freq: Four times a day (QID) | ORAL | Status: DC | PRN

## 2016-11-05 MED ORDER — SODIUM CHLORIDE 0.9 % IV SOLN
1000.0000 mg | INTRAVENOUS | Status: AC
  Administered 2016-11-05: 1000 mg via INTRAVENOUS
  Filled 2016-11-05: qty 10

## 2016-11-05 MED ORDER — MORPHINE SULFATE (PF) 2 MG/ML IV SOLN
2.0000 mg | INTRAVENOUS | Status: DC | PRN
  Administered 2016-11-05 – 2016-11-06 (×5): 2 mg via INTRAVENOUS
  Filled 2016-11-05 (×5): qty 1

## 2016-11-05 MED ORDER — CEFAZOLIN SODIUM-DEXTROSE 2-3 GM-% IV SOLR
INTRAVENOUS | Status: DC | PRN
  Administered 2016-11-05: 3 g via INTRAVENOUS

## 2016-11-05 MED ORDER — IRBESARTAN 150 MG PO TABS
300.0000 mg | ORAL_TABLET | Freq: Every day | ORAL | Status: DC
  Administered 2016-11-05 – 2016-11-07 (×3): 300 mg via ORAL
  Filled 2016-11-05 (×3): qty 2

## 2016-11-05 MED ORDER — GLYCOPYRROLATE 0.2 MG/ML IJ SOLN
INTRAMUSCULAR | Status: AC
  Filled 2016-11-05: qty 1

## 2016-11-05 MED ORDER — FAMOTIDINE 20 MG PO TABS
ORAL_TABLET | ORAL | Status: AC
  Administered 2016-11-05: 20 mg via ORAL
  Filled 2016-11-05: qty 1

## 2016-11-05 MED ORDER — AMLODIPINE BESYLATE 10 MG PO TABS
10.0000 mg | ORAL_TABLET | Freq: Every day | ORAL | Status: DC
  Administered 2016-11-05 – 2016-11-07 (×3): 10 mg via ORAL
  Filled 2016-11-05 (×3): qty 1

## 2016-11-05 MED ORDER — BUPIVACAINE HCL (PF) 0.5 % IJ SOLN
INTRAMUSCULAR | Status: DC | PRN
  Administered 2016-11-05: 3 mL via INTRATHECAL

## 2016-11-05 MED ORDER — SODIUM CHLORIDE 0.9 % IV SOLN
INTRAVENOUS | Status: DC
  Administered 2016-11-05: 09:00:00 via INTRAVENOUS

## 2016-11-05 MED ORDER — ONDANSETRON HCL 4 MG PO TABS: 4.0000 mg | ORAL_TABLET | Freq: Four times a day (QID) | ORAL | Status: DC | PRN

## 2016-11-05 MED ORDER — HYDRALAZINE HCL 10 MG PO TABS
10.0000 mg | ORAL_TABLET | Freq: Two times a day (BID) | ORAL | Status: DC
  Administered 2016-11-05 – 2016-11-07 (×4): 10 mg via ORAL
  Filled 2016-11-05 (×6): qty 1

## 2016-11-05 MED ORDER — MIDAZOLAM HCL 2 MG/2ML IJ SOLN
INTRAMUSCULAR | Status: AC
Start: 2016-11-05 — End: 2016-11-05
  Filled 2016-11-05: qty 2

## 2016-11-05 MED ORDER — PHENYLEPHRINE HCL 10 MG/ML IJ SOLN
INTRAMUSCULAR | Status: DC | PRN
  Administered 2016-11-05 (×5): 80 ug via INTRAVENOUS

## 2016-11-05 MED ORDER — BUPIVACAINE-EPINEPHRINE (PF) 0.25% -1:200000 IJ SOLN
INTRAMUSCULAR | Status: AC
  Filled 2016-11-05: qty 30

## 2016-11-05 MED ORDER — ZOLPIDEM TARTRATE 5 MG PO TABS: 5.0000 mg | ORAL_TABLET | Freq: Every evening | ORAL | Status: DC | PRN

## 2016-11-05 MED ORDER — METOPROLOL SUCCINATE ER 25 MG PO TB24
25.0000 mg | ORAL_TABLET | Freq: Two times a day (BID) | ORAL | Status: DC
  Administered 2016-11-05 – 2016-11-07 (×6): 25 mg via ORAL
  Filled 2016-11-05 (×5): qty 1

## 2016-11-05 MED ORDER — MIDAZOLAM HCL 2 MG/2ML IJ SOLN
INTRAMUSCULAR | Status: AC
  Filled 2016-11-05: qty 2

## 2016-11-05 MED ORDER — HYDROCHLOROTHIAZIDE 25 MG PO TABS
25.0000 mg | ORAL_TABLET | Freq: Every day | ORAL | Status: DC
  Administered 2016-11-05 – 2016-11-07 (×3): 25 mg via ORAL
  Filled 2016-11-05 (×4): qty 1

## 2016-11-05 MED ORDER — METOCLOPRAMIDE HCL 10 MG PO TABS: 5.0000 mg | ORAL_TABLET | Freq: Three times a day (TID) | ORAL | Status: DC | PRN

## 2016-11-05 SURGICAL SUPPLY — 53 items
BLADE SAW SAG 18.5X105 (BLADE) ×3 IMPLANT
BNDG COHESIVE 6X5 TAN STRL LF (GAUZE/BANDAGES/DRESSINGS) ×9 IMPLANT
CANISTER SUCT 1200ML W/VALVE (MISCELLANEOUS) ×3 IMPLANT
CAPT HIP TOTAL 3 ×3 IMPLANT
CATH TRAY METER 16FR LF (MISCELLANEOUS) ×3 IMPLANT
CHLORAPREP W/TINT 26ML (MISCELLANEOUS) ×3 IMPLANT
DRAPE C-ARM XRAY 36X54 (DRAPES) ×3 IMPLANT
DRAPE INCISE IOBAN 66X60 STRL (DRAPES) IMPLANT
DRAPE POUCH INSTRU U-SHP 10X18 (DRAPES) ×3 IMPLANT
DRAPE SHEET LG 3/4 BI-LAMINATE (DRAPES) ×9 IMPLANT
DRAPE TABLE BACK 80X90 (DRAPES) ×3 IMPLANT
DRESSING SURGICEL FIBRLLR 1X2 (HEMOSTASIS) ×2 IMPLANT
DRSG OPSITE POSTOP 4X8 (GAUZE/BANDAGES/DRESSINGS) IMPLANT
DRSG SURGICEL FIBRILLAR 1X2 (HEMOSTASIS) ×6
ELECT BLADE 6.5 EXT (BLADE) ×3 IMPLANT
ELECT REM PT RETURN 9FT ADLT (ELECTROSURGICAL) ×3
ELECTRODE REM PT RTRN 9FT ADLT (ELECTROSURGICAL) ×1 IMPLANT
EVACUATOR 1/8 PVC DRAIN (DRAIN) ×3 IMPLANT
GLOVE BIOGEL PI IND STRL 7.5 (GLOVE) ×4 IMPLANT
GLOVE BIOGEL PI IND STRL 9 (GLOVE) ×1 IMPLANT
GLOVE BIOGEL PI INDICATOR 7.5 (GLOVE) ×8
GLOVE BIOGEL PI INDICATOR 9 (GLOVE) ×2
GLOVE SURG SYN 9.0  PF PI (GLOVE) ×4
GLOVE SURG SYN 9.0 PF PI (GLOVE) ×2 IMPLANT
GOWN SRG 2XL LVL 4 RGLN SLV (GOWNS) ×1 IMPLANT
GOWN STRL NON-REIN 2XL LVL4 (GOWNS) ×2
GOWN STRL REUS W/ TWL LRG LVL3 (GOWN DISPOSABLE) ×2 IMPLANT
GOWN STRL REUS W/TWL LRG LVL3 (GOWN DISPOSABLE) ×4
HOLDER FOLEY CATH W/STRAP (MISCELLANEOUS) ×3 IMPLANT
HOOD PEEL AWAY FLYTE STAYCOOL (MISCELLANEOUS) ×3 IMPLANT
KIT PREVENA INCISION MGT 13 (CANNISTER) ×3 IMPLANT
MAT BLUE FLOOR 46X72 FLO (MISCELLANEOUS) ×3 IMPLANT
NDL SAFETY 18GX1.5 (NEEDLE) ×3 IMPLANT
NEEDLE SPNL 18GX3.5 QUINCKE PK (NEEDLE) ×3 IMPLANT
NS IRRIG 1000ML POUR BTL (IV SOLUTION) ×3 IMPLANT
PACK HIP COMPR (MISCELLANEOUS) ×3 IMPLANT
SOL PREP PVP 2OZ (MISCELLANEOUS)
SOLUTION PREP PVP 2OZ (MISCELLANEOUS) IMPLANT
SPONGE DRAIN TRACH 4X4 STRL 2S (GAUZE/BANDAGES/DRESSINGS) ×3 IMPLANT
STAPLER SKIN PROX 35W (STAPLE) ×3 IMPLANT
STRAP SAFETY BODY (MISCELLANEOUS) ×3 IMPLANT
SUT DVC 2 QUILL PDO  T11 36X36 (SUTURE) ×2
SUT DVC 2 QUILL PDO T11 36X36 (SUTURE) ×1 IMPLANT
SUT SILK 0 (SUTURE) ×2
SUT SILK 0 30XBRD TIE 6 (SUTURE) ×1 IMPLANT
SUT V-LOC 90 ABS DVC 3-0 CL (SUTURE) ×3 IMPLANT
SUT VIC AB 1 CT1 36 (SUTURE) ×3 IMPLANT
SYR 20CC LL (SYRINGE) ×3 IMPLANT
SYR 30ML LL (SYRINGE) ×3 IMPLANT
SYR 5ML 18GX1 1/2 (NEEDLE) ×3 IMPLANT
TAPE MICROFOAM 4IN (TAPE) ×3 IMPLANT
TOWEL OR 17X26 4PK STRL BLUE (TOWEL DISPOSABLE) ×3 IMPLANT
WND VAC CANISTER 500ML (MISCELLANEOUS) ×3 IMPLANT

## 2016-11-05 NOTE — NC FL2 (Signed)
[place] LEVEL OF CARE SCREENING TOOL     IDENTIFICATION  Patient Name: Claire Hansen Birthdate: 10-07-1960 Sex: female Admission Date (Current Location): 11/05/2016  [place] and [place] Number:  Engineering geologist and Address:  [facility], [address] [place], [place] [address]      Provider Number: [id number]  Attending Physician Name and Address:  Hessie Knows, MD  Relative Name and Phone Number:       Current Level of Care: Hospital Recommended Level of Care: [place] Prior Approval Number:    Date Approved/Denied:   PASRR Number:   [id number]  Discharge Plan: SNF    Current Diagnoses: Patient Active Problem List   Diagnosis Date Noted  . Primary localized osteoarthritis of right hip 11/05/2016  . Hyperlipidemia 08/13/2014  . History of asthma 08/13/2014  . Resistant hypertension 08/13/2014  . Microalbuminuria 08/13/2014  . Morbid obesity with BMI of 45.0-49.9, adult ([place]) 08/13/2014  . Osteoarthritis of right knee 08/13/2014  . Allergic rhinitis, seasonal 08/13/2014  . Diabetes mellitus with renal manifestation ([place]) 11/06/2010    Orientation RESPIRATION BLADDER Height & Weight     Self, Time, Situation, Place  Normal Continent Weight: 280 lb (127 kg) Height:  5\' 5"  (165.1 cm)  BEHAVIORAL SYMPTOMS/MOOD NEUROLOGICAL BOWEL NUTRITION STATUS      Continent Diet (Card Modified)  AMBULATORY STATUS COMMUNICATION OF NEEDS Skin   Extensive Assist Verbally Surgical wounds (Incision Right Hip)                       Personal Care Assistance Level of Assistance  Bathing, Feeding, Dressing Bathing Assistance: Limited assistance Feeding assistance: Independent Dressing Assistance: Limited assistance     Functional Limitations Info  Sight, Hearing, Speech Sight Info: Adequate Hearing Info: Adequate Speech Info: Adequate    SPECIAL CARE FACTORS FREQUENCY  PT (By licensed PT), OT (By  licensed OT)     PT Frequency:  (5) OT Frequency:  (5)            Contractures      Additional Factors Info  Code Status, Allergies Code Status Info:  (Full Code) Allergies Info:  (SHELLFISH ALLERGY )           Current Medications (11/05/2016):  This is the current hospital active medication list Current Facility-Administered Medications  Medication Dose Route Frequency Provider Last Rate Last Dose  . 0.9 %  sodium chloride infusion   Intravenous Continuous Hessie Knows, MD      . acetaminophen (TYLENOL) tablet 650 mg  650 mg Oral Q6H PRN Hessie Knows, MD       Or  . acetaminophen (TYLENOL) suppository 650 mg  650 mg Rectal Q6H PRN Hessie Knows, MD      . alum & mag hydroxide-simeth (MAALOX/MYLANTA) 200-200-20 MG/5ML suspension 30 mL  30 mL Oral Q4H PRN Hessie Knows, MD      . bisacodyl (DULCOLAX) suppository 10 mg  10 mg Rectal Daily PRN Hessie Knows, MD      . ceFAZolin (ANCEF) 2 g in dextrose 5 % 100 mL IVPB  2 g Intravenous Q6H Hessie Knows, MD      . diphenhydrAMINE (BENADRYL) 12.5 MG/5ML elixir 12.5-25 mg  12.5-25 mg Oral Q4H PRN Hessie Knows, MD      . docusate sodium (COLACE) capsule 100 mg  100 mg Oral BID Hessie Knows, MD      . Derrill Memo ON 11/06/2016] enoxaparin (LOVENOX) injection 40 mg  40 mg Subcutaneous Q12H Hessie Knows, MD      . hydrALAZINE (APRESOLINE) tablet 10 mg  10 mg Oral BID Hessie Knows, MD      . insulin aspart (novoLOG) injection 0-15 Units  0-15 Units Subcutaneous TID WC Hessie Knows, MD      . loratadine (CLARITIN) tablet 10 mg  10 mg Oral Daily PRN Hessie Knows, MD      . magnesium citrate solution 1 Bottle  1 Bottle Oral Once PRN Hessie Knows, MD      . magnesium hydroxide (MILK OF MAGNESIA) suspension 30 mL  30 mL Oral Daily PRN Hessie Knows, MD      . menthol-cetylpyridinium (CEPACOL) lozenge 3 mg  1 lozenge Oral PRN Hessie Knows, MD       Or  . phenol (CHLORASEPTIC) mouth spray 1 spray  1 spray Mouth/Throat PRN Hessie Knows, MD       . Derrill Memo ON 11/06/2016] metFORMIN (GLUCOPHAGE-XR) 24 hr tablet 500 mg  500 mg Oral Q breakfast Hessie Knows, MD      . methocarbamol (ROBAXIN) tablet 500 mg  500 mg Oral Q6H PRN Hessie Knows, MD       Or  . methocarbamol (ROBAXIN) 500 mg in dextrose 5 % 50 mL IVPB  500 mg Intravenous Q6H PRN Hessie Knows, MD      . metoCLOPramide (REGLAN) tablet 5-10 mg  5-10 mg Oral Q8H PRN Hessie Knows, MD       Or  . metoCLOPramide (REGLAN) injection 5-10 mg  5-10 mg Intravenous Q8H PRN Hessie Knows, MD      . metoprolol succinate (TOPROL-XL) 24 hr tablet 25 mg  25 mg Oral BID Hessie Knows, MD      . montelukast (SINGULAIR) tablet 10 mg  10 mg Oral Daily PRN Hessie Knows, MD      . morphine 2 MG/ML injection 2 mg  2 mg Intravenous Q1H PRN Hessie Knows, MD      . Olmesartan-Amlodipine-HCTZ 40-10-25 MG TABS 1 tablet  1 tablet Oral Daily Hessie Knows, MD      . ondansetron [facility]) tablet 4 mg  4 mg Oral Q6H PRN Hessie Knows, MD       Or  . ondansetron [facility]) injection 4 mg  4 mg Intravenous Q6H PRN Hessie Knows, MD      . oxyCODONE (Oxy IR/ROXICODONE) immediate release tablet 5-10 mg  5-10 mg Oral Q3H PRN Hessie Knows, MD      . spironolactone (ALDACTONE) tablet 25 mg  25 mg Oral Daily Hessie Knows, MD      . zolpidem (AMBIEN) tablet 5 mg  5 mg Oral QHS PRN Hessie Knows, MD         Discharge Medications: Please see discharge summary for a list of discharge medications.  Relevant Imaging Results:  Relevant Lab Results:   Additional Information  (SSN: [id number])  Smith Mince, Student-Social Work

## 2016-11-05 NOTE — Anesthesia Procedure Notes (Signed)
Spinal  Patient location during procedure: OR Start time: 11/05/2016 9:44 AM End time: 11/05/2016 9:48 AM Staffing Anesthesiologist: [name] Resident/CRNA: Marsh Dolly Performed: resident/CRNA  Preanesthetic Checklist Completed: patient identified, site marked, surgical consent, pre-op evaluation, timeout performed, IV checked, risks and benefits discussed and monitors and equipment checked Spinal Block Patient position: sitting Prep: Betadine Patient monitoring: heart rate, continuous pulse ox, blood pressure and cardiac monitor Approach: midline Location: L4-5 Injection technique: single-shot Needle Needle type: Whitacre and Introducer  Needle gauge: 24 G Needle length: 9 cm Assessment Sensory level: T10 Additional Notes Negative paresthesia. Negative blood return. Positive free-flowing CSF. Expiration date of kit checked and confirmed. Patient tolerated procedure well, without complications.

## 2016-11-05 NOTE — Transfer of Care (Signed)
Immediate Anesthesia Transfer of Care Note  Patient: Claire Hansen  Procedure(s) Performed: TOTAL HIP ARTHROPLASTY ANTERIOR APPROACH (Right Hip)  Patient Location: PACU  Anesthesia Type:Spinal  Level of Consciousness: awake, alert  and oriented  Airway & Oxygen Therapy: Patient Spontanous Breathing and Patient connected to face mask oxygen  Post-op Assessment: Report given to RN and Post -op Vital signs reviewed and stable  Post vital signs: Reviewed and stable  Last Vitals:  Vitals:   11/05/16 0819  BP: (!) 191/112  Pulse: 84  Resp: 16  Temp: 36.7 C  SpO2: 96%    Last Pain:  Vitals:   11/05/16 0819  TempSrc: Oral  PainSc: 6          Complications: No apparent anesthesia complications

## 2016-11-05 NOTE — H&P (Signed)
Reviewed paper H+P, will be scanned into chart. No changes noted.  

## 2016-11-05 NOTE — Anesthesia Preprocedure Evaluation (Signed)
Anesthesia Evaluation  Patient identified by MRN, date of birth, ID band Patient awake    Reviewed: Allergy & Precautions, H&P , NPO status , Patient's Chart, lab work & pertinent test results, reviewed documented beta blocker date and time   History of Anesthesia Complications Negative for: history of anesthetic complications  Airway Mallampati: III  TM Distance: >3 FB Neck ROM: full    Dental  (+) Caps, Dental Advidsory Given, Missing, Teeth Intact   Pulmonary neg shortness of breath, asthma , neg sleep apnea, neg COPD, neg recent URI,           Cardiovascular Exercise Tolerance: Good hypertension, (-) angina(-) CAD, (-) Past MI, (-) Cardiac Stents and (-) CABG (-) dysrhythmias (-) Valvular Problems/Murmurs     Neuro/Psych negative neurological ROS  negative psych ROS   GI/Hepatic negative GI ROS, Neg liver ROS,   Endo/Other  diabetes, Well Controlled, Oral Hypoglycemic AgentsMorbid obesity  Renal/GU Renal disease  negative genitourinary   Musculoskeletal   Abdominal   Peds  Hematology negative hematology ROS (+)   Anesthesia Other Findings Past Medical History: No date: Allergy No date: Anxiety No date: Asthma     Comment:  pt denies having asthma No date: Chronic osteoarthritis     Comment:  knees No date: Deviated septum No date: Diabetes mellitus without complication (HCC) No date: Hyperlipidemia No date: Hypertension No date: Irregular menstrual cycle No date: Lymphadenopathy No date: Obesity No date: Peri-menopause 2015: Pneumonia   Reproductive/Obstetrics negative OB ROS                             Anesthesia Physical Anesthesia Plan  ASA: III  Anesthesia Plan: Spinal   Post-op Pain Management:    Induction:   PONV Risk Score and Plan: 3 and Propofol infusion and Midazolam  Airway Management Planned: Simple Face Mask  Additional Equipment:   Intra-op Plan:    Post-operative Plan:   Informed Consent: I have reviewed the patients History and Physical, chart, labs and discussed the procedure including the risks, benefits and alternatives for the proposed anesthesia with the patient or authorized representative who has indicated his/her understanding and acceptance.   Dental Advisory Given  Plan Discussed with: Anesthesiologist, CRNA and Surgeon  Anesthesia Plan Comments:         Anesthesia Quick Evaluation

## 2016-11-05 NOTE — Progress Notes (Signed)
Anticoagulation monitoring(Lovenox):  56yo  F ordered Lovenox 40 mg Q12h  Filed Weights   11/05/16 0819  Weight: 280 lb (127 kg)   BMI 46.59   Lab Results  Component Value Date   CREATININE 0.97 11/04/2016   CREATININE 1.1 09/13/2015   CREATININE 1.06 (H) 09/12/2015   Estimated Creatinine Clearance: 89 mL/min (by C-G formula based on SCr of 0.97 mg/dL). Hemoglobin & Hematocrit     Component Value Date/Time   HGB 14.9 11/04/2016 0832   HCT 43.2 11/04/2016 0832     Per Protocol for Patient with estCrcl > 30 ml/min and BMI > 40, will transition to Lovenox 40 mgQ12h.      Chinita Greenland PharmD Clinical Pharmacist 11/05/2016

## 2016-11-05 NOTE — Anesthesia Post-op Follow-up Note (Signed)
Anesthesia QCDR form completed.        

## 2016-11-05 NOTE — Op Note (Signed)
11/05/2016  11:58 AM  PATIENT:  Claire Hansen  56 y.o. female  PRE-OPERATIVE DIAGNOSIS:  primary osteoarthritis of right hip  POST-OPERATIVE DIAGNOSIS:  primary osteoarthritis of right hip  PROCEDURE:  Procedure(s): TOTAL HIP ARTHROPLASTY ANTERIOR APPROACH (Right)  SURGEON: Laurene Footman, MD  ASSISTANTS: None  ANESTHESIA:   spinal  EBL:  Total I/O In: 1700 [I.V.:1700] Out: 600 [Urine:100; Blood:500]  BLOOD ADMINISTERED:none  DRAINS: (2) Hemovact drain(s) in the Subcutaneous layer with  Suction Open   LOCAL MEDICATIONS USED:  MARCAINE     SPECIMEN:  Source of Specimen:  Right femoral head  DISPOSITION OF SPECIMEN:  PATHOLOGY  COUNTS:  YES  TOURNIQUET:  * No tourniquets in log *  IMPLANTS:  AMIS  1 standard, 28 mm S ceramic head, Mpact 54 mm DM cup and liner  DICTATION: .Dragon Dictation   The patient was brought to the operating room and after spinal anesthesia was obtained patient was placed on the operative table with the ipsilateral foot into the Medacta attachment, contralateral leg on a well-padded table. C-arm was brought in and preop template x-ray taken. After prepping and draping in usual sterile fashion appropriate patient identification and timeout procedures were completed. Anterior approach to the hip was obtained and centered over the greater trochanter and TFL muscle. The subcutaneous tissue was incised hemostasis being achieved by electrocautery. TFL fascia was incised and the muscle retracted laterally deep retractor placed. The lateral femoral circumflex vessels were identified and ligated. The anterior capsule was exposed and a capsulotomy performed. The neck was identified and a femoral neck cut carried out with a saw. The head was removed without difficulty and showed sclerotic femoral head and acetabulum. Reaming was carried out to 52 mm and a 54 mm cup trial gave appropriate tightness to the acetabular component a 54 DM cup was impacted into position.  The leg was then externally rotated and ischiofemoral and patellofemoral releases carried out. The femur was sequentially broached to a size 1, size 1 standard trials were placed and the final components chosen. The 1 standard stem was inserted along with a S ceramic 28 mm head and 54 mm liner. The hip was reduced and was stable the wound was thoroughly irrigated. The deep fascia was closing useing a heavy Quill after infiltration of 30 cc of quarter percent Sensorcaine with epinephrine. Subcutaneous drains were then inserted 3-0 v=locl to close the skin with skin staples with incisional wound vac  PLAN OF CARE: Admit to inpatient

## 2016-11-05 NOTE — Evaluation (Signed)
Physical Therapy Evaluation Patient Details Name: Claire Hansen MRN: [id number] DOB: 11-07-1960 Today's Date: 11/05/2016   History of Present Illness  admitted for acute hospitalization status post R THR (11/05/16), WBAT, anterior approach.  Clinical Impression  Upon evaluation, patient alert and oriented; follows all commands and demonstrates good safety awareness/insight.  R LE strength (at least 3-/5) and ROM generally guarded, limited by post-op pain; sensation fully intact.  Eager for OOB/repositioning during session for improved pain control.  Demonstrates ability to complete bed mobility with min/mod assist; sit/stand, basic transfers and short-distance gait (5') with RW, min assist +1.  Very short, choppy steps with limited weight acceptance R LE, but no overt buckling or LOB.  Family (husband and son) present and very supportive, encouraging throughout session. Would benefit from skilled PT to address above deficits and promote optimal return to PLOF; Recommend transition to [place] upon discharge from acute hospitalization.     Follow Up Recommendations Home health PT    Equipment Recommendations   (has SPC, RW)    Recommendations for Other Services       Precautions / Restrictions Precautions Precautions: Fall Restrictions Weight Bearing Restrictions: Yes RLE Weight Bearing: Weight bearing as tolerated      Mobility  Bed Mobility Overal bed mobility: Needs Assistance Bed Mobility: Supine to Sit     Supine to sit: Min assist;Mod assist        Transfers Overall transfer level: Needs assistance Equipment used: Rolling walker (2 wheeled) Transfers: Sit to/from Stand Sit to Stand: Min assist         General transfer comment: cuing for hand placement, fair active use of R LE with movement transitions  Ambulation/Gait Ambulation/Gait assistance: Min assist Ambulation Distance (Feet): 5 Feet Assistive device: Rolling walker (2 wheeled)       General Gait  Details: broad BOS, choppy, stuttering steps with decreased step height/length; fair stance time/weight acceptance R LE  Stairs            Wheelchair Mobility    Modified Rankin (Stroke Patients Only)       Balance Overall balance assessment: Needs assistance Sitting-balance support: No upper extremity supported;Feet supported Sitting balance-Leahy Scale: Good     Standing balance support: Bilateral upper extremity supported Standing balance-Leahy Scale: Fair                               Pertinent Vitals/Pain Pain Assessment: Faces Faces Pain Scale: Hurts even more Pain Location: R hip Pain Descriptors / Indicators: Aching;Grimacing;Guarding Pain Intervention(s): Limited activity within patient's tolerance;Monitored during session;Repositioned;Premedicated before session    Home Living Family/patient expects to be discharged to:: Private residence Living Arrangements: Spouse/significant other;Children Available Help at Discharge: Family Type of Home: Apartment Home Access: Stairs to enter Entrance Stairs-Rails: Right;Left;Can reach both Technical brewer of Steps: 12 x2 (landing between flights, can place chair as needed) Home Layout: One level Home Equipment: Walker - 2 wheels;Cane - single point      Prior Function Level of Independence: Independent with assistive device(s)         Comments: Mod indep with ADLs, household and community mobility; intermittent use of SPC vs RW as needed due to R LE pain.  Denies fall history.     Hand Dominance        Extremity/Trunk Assessment   Upper Extremity Assessment Upper Extremity Assessment: Overall WFL for tasks assessed    Lower Extremity Assessment Lower Extremity Assessment:  (  R LE grossly 3-/5, limited by post-op pain.  Sensation fully intact)       Communication   Communication: No difficulties  Cognition Arousal/Alertness: Awake/alert Behavior During Therapy: WFL for tasks  assessed/performed Overall Cognitive Status: Within Functional Limits for tasks assessed                                        General Comments      Exercises     Assessment/Plan    PT Assessment Patient needs continued PT services  PT Problem List Decreased strength;Decreased range of motion;Decreased activity tolerance;Decreased balance;Decreased mobility;Decreased coordination;Decreased cognition;Decreased safety awareness;Decreased knowledge of precautions;Pain       PT Treatment Interventions DME instruction;Gait training;Stair training;Functional mobility training;Therapeutic activities;Therapeutic exercise;Balance training;Patient/family education    PT Goals (Current goals can be found in the Care Plan section)  Acute Rehab PT Goals Patient Stated Goal: to return home PT Goal Formulation: With patient/family Time For Goal Achievement: 11/19/16 Potential to Achieve Goals: Good    Frequency BID   Barriers to discharge Inaccessible home environment      Co-evaluation               AM-PAC PT "6 Clicks" Daily Activity  Outcome Measure Difficulty turning over in bed (including adjusting bedclothes, sheets and blankets)?: Unable Difficulty moving from lying on back to sitting on the side of the bed? : Unable Difficulty sitting down on and standing up from a chair with arms (e.g., wheelchair, bedside commode, etc,.)?: Unable Help needed moving to and from a bed to chair (including a wheelchair)?: A Little Help needed walking in hospital room?: A Little Help needed climbing 3-5 steps with a railing? : A Lot 6 Click Score: 11    End of Session Equipment Utilized During Treatment: Gait belt Activity Tolerance: Patient tolerated treatment well Patient left: in chair;with call bell/phone within reach;with family/visitor present;with chair alarm set Nurse Communication: Mobility status PT Visit Diagnosis: Difficulty in walking, not elsewhere  classified (R26.2);Muscle weakness (generalized) (M62.81);Pain Pain - Right/Left: Right Pain - part of body: Hip    Time: 7989-2119 PT Time Calculation (min) (ACUTE ONLY): 18 min   Charges:   PT Evaluation $PT Eval Low Complexity: 1 Low     PT G Codes:   PT G-Codes **NOT FOR INPATIENT CLASS** Functional Assessment Tool Used: AM-PAC 6 Clicks Basic Mobility Functional Limitation: Mobility: Walking and moving around Mobility: Walking and Moving Around Current Status (E1740): At least 40 percent but less than 60 percent impaired, limited or restricted Mobility: Walking and Moving Around Goal Status [phone]): At least 1 percent but less than 20 percent impaired, limited or restricted    Isla Sabree H. Owens Shark, PT, DPT, NCS 11/05/16, 5:22 PM [phone]

## 2016-11-06 LAB — CBC
HCT: 38.7 % (ref 35.0–47.0)
Hemoglobin: 13 g/dL (ref 12.0–16.0)
MCH: 29.3 pg (ref 26.0–34.0)
MCHC: 33.7 g/dL (ref 32.0–36.0)
MCV: 87 fL (ref 80.0–100.0)
PLATELETS: 285 10*3/uL (ref 150–440)
RBC: 4.45 MIL/uL (ref 3.80–5.20)
RDW: 15.9 % — AB (ref 11.5–14.5)
WBC: 11 10*3/uL (ref 3.6–11.0)

## 2016-11-06 LAB — GLUCOSE, CAPILLARY
GLUCOSE-CAPILLARY: 181 mg/dL — AB (ref 65–99)
GLUCOSE-CAPILLARY: 227 mg/dL — AB (ref 65–99)
Glucose-Capillary: 164 mg/dL — ABNORMAL HIGH (ref 65–99)
Glucose-Capillary: 159 mg/dL — ABNORMAL HIGH (ref 65–99)

## 2016-11-06 LAB — BASIC METABOLIC PANEL
ANION GAP: 7 (ref 5–15)
BUN: 13 mg/dL (ref 6–20)
CALCIUM: 9.2 mg/dL (ref 8.9–10.3)
CO2: 29 mmol/L (ref 22–32)
Chloride: 102 mmol/L (ref 101–111)
Creatinine, Ser: 0.86 mg/dL (ref 0.44–1.00)
Glucose, Bld: 183 mg/dL — ABNORMAL HIGH (ref 65–99)
POTASSIUM: 3.4 mmol/L — AB (ref 3.5–5.1)
Sodium: 138 mmol/L (ref 135–145)

## 2016-11-06 MED ORDER — POTASSIUM CHLORIDE 20 MEQ PO PACK
20.0000 meq | PACK | Freq: Two times a day (BID) | ORAL | Status: AC
Start: 2016-11-06 — End: 2016-11-06
  Administered 2016-11-06 (×2): 20 meq via ORAL
  Filled 2016-11-06 (×2): qty 1

## 2016-11-06 MED ORDER — CLONIDINE HCL 0.1 MG PO TABS
0.2000 mg | ORAL_TABLET | Freq: Once | ORAL | Status: AC
  Administered 2016-11-06: 0.2 mg via ORAL
  Filled 2016-11-06: qty 2

## 2016-11-06 MED ORDER — CLONIDINE HCL 0.1 MG PO TABS: 0.1000 mg | ORAL_TABLET | Freq: Two times a day (BID) | ORAL | Status: DC | PRN

## 2016-11-06 NOTE — Anesthesia Postprocedure Evaluation (Signed)
Anesthesia Post Note  Patient: Claire Hansen  Procedure(s) Performed: TOTAL HIP ARTHROPLASTY ANTERIOR APPROACH (Right Hip)  Patient location during evaluation: Nursing Unit Anesthesia Type: Spinal Level of consciousness: oriented and awake and alert Pain management: pain level controlled Vital Signs Assessment: post-procedure vital signs reviewed and stable Respiratory status: spontaneous breathing and respiratory function stable Cardiovascular status: blood pressure returned to baseline and stable Postop Assessment: no headache, no backache and no apparent nausea or vomiting Anesthetic complications: no     Last Vitals:  Vitals:   11/06/16 0745 11/06/16 0745  BP: (!) 151/79 (!) 151/79  Pulse:  68  Resp:  17  Temp:  37.2 C  SpO2:  95%    Last Pain:  Vitals:   11/06/16 0745  TempSrc: Oral  PainSc:                  Virgilio Frees

## 2016-11-06 NOTE — Progress Notes (Signed)
Physical Therapy Treatment Patient Details Name: Claire Hansen MRN: [id number] DOB: [birth year] Today's Date: 11/06/2016    History of Present Illness admitted for acute hospitalization status post R THR (11/05/16), WBAT, anterior approach.    PT Comments    Pt able to perform exercises in recliner; required min assist for R SLR and heel slides; unable to fully elevate RLE off of recliner during SLR. Sit to/from standing transfers with min guard. Pt improved walking distance; able to ambulate 177ft with RW and min guard; improved gait mechanics and speed compared to initial evaluation. Next session plan to increase walking distance and attempt stairs as tolerable.  Follow Up Recommendations  Home health PT     Equipment Recommendations       Recommendations for Other Services       Precautions / Restrictions Precautions Precautions: Fall;Anterior Hip Precaution Booklet Issued: Yes (comment) Precaution Comments: PT educated pt on anterior THR precautions Restrictions Weight Bearing Restrictions: Yes RLE Weight Bearing: Weight bearing as tolerated    Mobility  Bed Mobility               General bed mobility comments: deferred, pt in recliner  Transfers Overall transfer level: Needs assistance Equipment used: Rolling walker (2 wheeled) Transfers: Sit to/from Stand Sit to Stand: Min guard         General transfer comment: min guard for safety; vc's for sequencing; active use of R LE with transfer tasks; uncontrolled descent into sitting  Ambulation/Gait Ambulation/Gait assistance: Min guard Ambulation Distance (Feet): 160 Feet Assistive device: Rolling walker (2 wheeled) Gait Pattern/deviations: Step-through pattern;Decreased stance time - right;Decreased stride length     General Gait Details: decreased stride length bilaterally; slight heel strike pattern; lateral trunk lean during stance phase bilaterally   Stairs            Wheelchair Mobility     Modified Rankin (Stroke Patients Only)       Balance Overall balance assessment: Needs assistance Sitting-balance support: No upper extremity supported;Feet supported Sitting balance-Leahy Scale: Good Sitting balance - Comments: able to sit on edge of recliner and pull pants down past her knees with mod assist   Standing balance support: Single extremity supported Standing balance-Leahy Scale: Fair Standing balance comment: able to pull pants up in standing with UUE support on RW                            Cognition Arousal/Alertness: Awake/alert Behavior During Therapy: [facility] for tasks assessed/performed Overall Cognitive Status: Within Functional Limits for tasks assessed                                 General Comments: pt reports she was lethargic initially, but woke up more as session went on      Exercises Total Joint Exercises Ankle Circles/Pumps: AROM;Both;10 reps Quad Sets: Strengthening;Both;10 reps Gluteal Sets: Strengthening;Both;10 reps Towel Squeeze: Strengthening;Both;10 reps Short Arc Quad: AROM;Both;10 reps Heel Slides: AROM;Left;AAROM;Right;10 reps Hip ABduction/ADduction: AROM;Both;10 reps Straight Leg Raises: AROM;Left;AAROM;Right;10 reps    General Comments        Pertinent Vitals/Pain Pain Assessment: Faces Faces Pain Scale: Hurts even more Pain Location: R hip Pain Descriptors / Indicators: Sore;Discomfort;Guarding Pain Intervention(s): Limited activity within patient's tolerance;Monitored during session    Home Living  Prior Function            PT Goals (current goals can now be found in the care plan section) Acute Rehab PT Goals Patient Stated Goal: to return home PT Goal Formulation: With patient/family Time For Goal Achievement: 11/19/16 Potential to Achieve Goals: Good Progress towards PT goals: Progressing toward goals    Frequency    BID      PT Plan       Co-evaluation              AM-PAC PT "6 Clicks" Daily Activity  Outcome Measure  Difficulty turning over in bed (including adjusting bedclothes, sheets and blankets)?: A Little Difficulty moving from lying on back to sitting on the side of the bed? : A Little Difficulty sitting down on and standing up from a chair with arms (e.g., wheelchair, bedside commode, etc,.)?: A Little Help needed moving to and from a bed to chair (including a wheelchair)?: A Little Help needed walking in hospital room?: A Little Help needed climbing 3-5 steps with a railing? : A Lot 6 Click Score: 17    End of Session Equipment Utilized During Treatment: Gait belt Activity Tolerance: Patient tolerated treatment well Patient left: in chair;with call bell/phone within reach;with chair alarm set;with family/visitor present;with SCD's reapplied (heels elevated) Nurse Communication: Mobility status PT Visit Diagnosis: Difficulty in walking, not elsewhere classified (R26.2);Muscle weakness (generalized) (M62.81);Pain Pain - Right/Left: Right Pain - part of body: Hip     Time: 7412-8786 PT Time Calculation (min) (ACUTE ONLY): 36 min  Charges:                       G CodesWetzel Bjornstad, SPT 11/06/2016, 10:45 AM

## 2016-11-06 NOTE — Progress Notes (Signed)
Clinical Social Worker (CSW) received SNF consult. PT is recommending home health. CSW sent RN case manager a message making her aware of above. Please reconsult if future social work needs arise. CSW signing off.   Twylah Bennetts, LCSW [phone]  

## 2016-11-06 NOTE — Progress Notes (Signed)
   Subjective: 1 Day Post-Op Procedure(s) (LRB): TOTAL HIP ARTHROPLASTY ANTERIOR APPROACH (Right) Patient reports pain as mild.   Patient is well, and has had no acute complaints or problems Denies any CP, SOB, ABD pain. We will continue therapy today.    Objective: Vital signs in last 24 hours: Temp:  [97.4 F (36.3 C)-98.9 F (37.2 C)] 98.9 F (37.2 C) (10/12 0745) Pulse Rate:  [67-103] 68 (10/12 0745) Resp:  [12-20] 17 (10/12 0745) BP: (133-207)/(45-112) 151/79 (10/12 0745) SpO2:  [93 %-97 %] 95 % (10/12 0745) Weight:  [127 kg (280 lb)] 127 kg (280 lb) (10/11 0819)  Intake/Output from previous day: 10/11 0701 - 10/12 0700 In: 2071.7 [I.V.:2071.7] Out: 3700 [Urine:3200; Blood:500] Intake/Output this shift: No intake/output data recorded.   Recent Labs  11/04/16 0832 11/05/16 1648 11/06/16 0333  HGB 14.9 13.1 13.0    Recent Labs  11/05/16 1648 11/06/16 0333  WBC 19.4* 11.0  RBC 4.58 4.45  HCT 39.8 38.7  PLT 309 285    Recent Labs  11/04/16 0832 11/05/16 1648 11/06/16 0333  NA 140  --  138  K 3.7  --  3.4*  CL 102  --  102  CO2 25  --  29  BUN 15  --  13  CREATININE 0.97 1.14* 0.86  GLUCOSE 143*  --  183*  CALCIUM 10.1  --  9.2    Recent Labs  11/04/16 0832  INR 1.02    EXAM General - Patient is Alert, Appropriate and Oriented Extremity - Neurovascular intact Sensation intact distally Intact pulses distally Dorsiflexion/Plantar flexion intact No cellulitis present Compartment soft Dressing - dressing C/D/I, no drainage and hemovac and wound vac intact Motor Function - intact, moving foot and toes well on exam.   Past Medical History:  Diagnosis Date  . Allergy   . Anxiety   . Asthma    pt denies having asthma  . Chronic osteoarthritis    knees  . Deviated septum   . Diabetes mellitus without complication ([place])   . Hyperlipidemia   . Hypertension   . Irregular menstrual cycle   . Lymphadenopathy   . Obesity   .  Peri-menopause   . Pneumonia 2015    Assessment/Plan:   1 Day Post-Op Procedure(s) (LRB): TOTAL HIP ARTHROPLASTY ANTERIOR APPROACH (Right) Active Problems:   Primary localized osteoarthritis of right hip  Estimated body mass index is 46.59 kg/m as calculated from the following:   Height as of this encounter: 5\' 5"  (1.651 m).   Weight as of this encounter: 127 kg (280 lb). Advance diet Up with therapy Needs BM Hypokalemia - K 3.4, add klor kon 20 meq BID CM to assist with discharge Recheck labs in the am   DVT Prophylaxis - Lovenox, Foot Pumps and TED hose Weight-Bearing as tolerated to right leg   T. Rachelle Hora, PA-C [place] 11/06/2016, 7:53 AM

## 2016-11-06 NOTE — Progress Notes (Signed)
Inpatient Diabetes Program Recommendations  AACE/ADA: New Consensus Statement on Inpatient Glycemic Control (2015)  Target Ranges:  Prepandial:   less than 140 mg/dL      Peak postprandial:   less than 180 mg/dL (1-2 hours)      Critically ill patients:  140 - 180 mg/dL   Lab Results  Component Value Date   GLUCAP 164 (H) 11/06/2016   HGBA1C 7.0 (H) 09/12/2015    Review of Glycemic Control  Results for Claire Hansen, Claire Hansen (MRN [id number]) as of 11/06/2016 13:14  Ref. Range 11/05/2016 12:17 11/05/2016 17:28 11/05/2016 22:11 11/06/2016 07:44 11/06/2016 11:32  Glucose-Capillary Latest Ref Range: 65 - 99 mg/dL 162 (H) 224 (H) 234 (H) 181 (H) 164 (H)    Diabetes history: Type 2 Outpatient Diabetes medications: Glucophage 500mg  qday Current orders for Inpatient glycemic control: Glucophage 500mg  qday, Novolog 0-15 units tid  Inpatient Diabetes Program Recommendations:    Per ADA recommendations "consider performing an A1C on all patients with diabetes or hyperglycemia admitted to the hospital if not performed in the prior 3 months".  Consider adding Novolog 0-5 units qhs   Gentry Fitz, RN, [place], [place], CDE Diabetes Coordinator Inpatient Diabetes Program  [phone] (Team Pager) [phone] ([place]) 11/06/2016 1:15 PM
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
## 2016-11-07 LAB — BASIC METABOLIC PANEL
ANION GAP: 10 (ref 5–15)
BUN: 15 mg/dL (ref 6–20)
CALCIUM: 9.5 mg/dL (ref 8.9–10.3)
CO2: 28 mmol/L (ref 22–32)
CREATININE: 1.17 mg/dL — AB (ref 0.44–1.00)
Chloride: 97 mmol/L — ABNORMAL LOW (ref 101–111)
GFR calc Af Amer: >60 mL/min (ref >60)
GFR, EST NON AFRICAN AMERICAN: 52 mL/min — AB (ref >60)
GLUCOSE: 194 mg/dL — AB (ref 65–99)
Potassium: 4.4 mmol/L (ref 3.5–5.1)
Sodium: 135 mmol/L (ref 135–145)

## 2016-11-07 LAB — CBC
HEMATOCRIT: 36.8 % (ref 35.0–47.0)
Hemoglobin: 12.5 g/dL (ref 12.0–16.0)
MCH: 29.7 pg (ref 26.0–34.0)
MCHC: 34.1 g/dL (ref 32.0–36.0)
MCV: 87.2 fL (ref 80.0–100.0)
PLATELETS: 259 10*3/uL (ref 150–440)
RBC: 4.22 MIL/uL (ref 3.80–5.20)
RDW: 15.9 % — AB (ref 11.5–14.5)
WBC: 13.3 10*3/uL — AB (ref 3.6–11.0)

## 2016-11-07 LAB — GLUCOSE, CAPILLARY
Glucose-Capillary: 150 mg/dL — ABNORMAL HIGH (ref 65–99)
Glucose-Capillary: 182 mg/dL — ABNORMAL HIGH (ref 65–99)

## 2016-11-07 MED ORDER — TRAMADOL HCL 50 MG PO TABS: 50.0000 mg | ORAL_TABLET | ORAL | Status: DC | PRN

## 2016-11-07 MED ORDER — ENOXAPARIN SODIUM 40 MG/0.4ML ~~LOC~~ SOLN: 40.0000 mg | SUBCUTANEOUS | 0 refills | Status: DC

## 2016-11-07 MED ORDER — OXYCODONE HCL 5 MG PO TABS: 5.0000 mg | ORAL_TABLET | ORAL | 0 refills | Status: DC | PRN

## 2016-11-07 MED ORDER — LACTULOSE 10 GM/15ML PO SOLN: 10.0000 g | Freq: Two times a day (BID) | ORAL | Status: DC | PRN

## 2016-11-07 MED ORDER — TRAMADOL HCL 50 MG PO TABS: 50.0000 mg | ORAL_TABLET | ORAL | 0 refills | Status: DC | PRN

## 2016-11-07 NOTE — Progress Notes (Signed)
Physical Therapy Treatment Patient Details Name: Claire Hansen MRN: [id number] DOB: 1961/01/20 Today's Date: 11/07/2016    History of Present Illness admitted for acute hospitalization status post R THR (11/05/16), WBAT, anterior approach.    PT Comments    Pt agreeable to PT; 5/10 pain R hip. Pt with min guard/supervision for all mobility. Requires cues for safe hand placement with transfers. Pt/spouse have questions regarding walking and returning to "normal". Education regarding strengthening, pain management and avoiding bad habits than inhibit progress. Educated on stand exercises for Right lower extremity. Pt notes comfort with stair climbing from prior session. Pt/spouse are requesting rolling walker and BSC at this time. Will notify CM. Pt also educated in appropriate rolling walker height. Pt plans to discharge home with home health today.    Follow Up Recommendations  Home health PT     Equipment Recommendations  Rolling walker with 5" wheels;3in1 (PT) (only has rollator at home)    Recommendations for Other Services       Precautions / Restrictions Precautions Precautions: Fall;Anterior Hip Restrictions Weight Bearing Restrictions: Yes RLE Weight Bearing: Weight bearing as tolerated    Mobility  Bed Mobility               General bed mobility comments: Not tested; out of bed  Transfers Overall transfer level: Needs assistance   Transfers: Sit to/from Stand Sit to Stand: Min guard         General transfer comment: cues for hand placement multiple times  Ambulation/Gait Ambulation/Gait assistance: Min guard Ambulation Distance (Feet): 25 Feet Assistive device: Rolling walker (2 wheeled) Gait Pattern/deviations: Step-through pattern;Decreased stance time - right;Decreased stride length Gait velocity: reduced Gait velocity interpretation: Below normal speed for age/gender General Gait Details: Lowered rw to appropriate height for improved de  weighting of RLE. Pt now desires rw    Stairs            Wheelchair Mobility    Modified Rankin (Stroke Patients Only)       Balance Overall balance assessment: Needs assistance Sitting-balance support: No upper extremity supported;Feet supported Sitting balance-Leahy Scale: Good     Standing balance support: Bilateral upper extremity supported Standing balance-Leahy Scale: Fair                              Cognition Arousal/Alertness: Awake/alert Behavior During Therapy: WFL for tasks assessed/performed Overall Cognitive Status: Within Functional Limits for tasks assessed                                        Exercises Total Joint Exercises Ankle Circles/Pumps: AROM;Both;10 reps Quad Sets: Strengthening;Both;10 reps Gluteal Sets: Strengthening;Both;10 reps Hip ABduction/ADduction: Strengthening;Right;10 reps;Standing Straight Leg Raises: Strengthening;Right;10 reps;Standing Long Arc Quad: Strengthening;Right;10 reps;Seated    General Comments        Pertinent Vitals/Pain Pain Assessment: 0-10 Pain Score: 5  Pain Location: R hip Pain Intervention(s): Premedicated before session;Limited activity within patient's tolerance;Monitored during session    Home Living                      Prior Function            PT Goals (current goals can now be found in the care plan section) Progress towards PT goals: Progressing toward goals    Frequency    BID  PT Plan Current plan remains appropriate    Co-evaluation              AM-PAC PT "6 Clicks" Daily Activity  Outcome Measure  Difficulty turning over in bed (including adjusting bedclothes, sheets and blankets)?: A Little Difficulty moving from lying on back to sitting on the side of the bed? : A Little Difficulty sitting down on and standing up from a chair with arms (e.g., wheelchair, bedside commode, etc,.)?: A Little Help needed moving to and from  a bed to chair (including a wheelchair)?: A Little Help needed walking in hospital room?: A Little Help needed climbing 3-5 steps with a railing? : A Little 6 Click Score: 18    End of Session Equipment Utilized During Treatment: Gait belt Activity Tolerance: Patient tolerated treatment well Patient left: in chair;with call bell/phone within reach;with family/visitor present   PT Visit Diagnosis: Difficulty in walking, not elsewhere classified (R26.2);Muscle weakness (generalized) (M62.81);Pain Pain - Right/Left: Right Pain - part of body: Hip     Time: 4920-1007 PT Time Calculation (min) (ACUTE ONLY): 31 min  Charges:  $Gait Training: 8-22 mins $Therapeutic Exercise: 8-22 mins                    G Codes:        Larae Grooms, PTA 11/07/2016, 10:39 AM

## 2016-11-07 NOTE — Progress Notes (Signed)
Discharge note;  Pt alert and oriented X4. IV removed. Prevena applied. Pt educated on removal of wound vac dressing in 7 days and to apply honeycomb dressing. Discharge instructions and prescriptions given. BSC and walker given to patient. No questions at this time. Pt ready for discharge.

## 2016-11-07 NOTE — Discharge Instructions (Signed)
Patient Name Sex DOB SSN  Claire Hansen  @GENDER @ 1960/12/02 [id number]    Discharge Planning by [facility].   Author: Watt Climes Service: Orthopedics Author Type: Physician Assistant    Filed: @T @ Note Time: 7:51 AM Status: Signed   Editor: Watt Climes., PA     Expand All Collapse All   ANTERIOR APPROACH TOTAL HIP REPLACEMENT POSTOPERATIVE DIRECTIONS   Hip Rehabilitation, Guidelines Following Surgery  The results of a hip operation are greatly improved after range of motion and muscle strengthening exercises. Follow all safety measures which are given to protect your hip. If any of these exercises cause increased pain or swelling in your joint, decrease the amount until you are comfortable again. Then slowly increase the exercises. Call your caregiver if you have problems or questions.   HOME CARE INSTRUCTIONS   Remove items at home which could result in a fall. This includes throw rugs or furniture in walking pathways.   ICE to the affected hip every three hours for 30 minutes at a time and then as needed for pain and swelling. Continue to use ice on the hip for pain and swelling from surgery. You may notice swelling that will progress down to the foot and ankle. This is normal after surgery. Elevate the leg when you are not up walking on it.   Continue to use the breathing machine which will help keep your temperature down. It is common for your temperature to cycle up and down following surgery, especially at night when you are not up moving around and exerting yourself. The breathing machine keeps your lungs expanded and your temperature down.  Do not place pillow under knee, focus on keeping the knee straight while resting  DIET You may resume your previous home diet once your are discharged from the hospital.  DRESSING / WOUND CARE / SHOWERING Change the surgical dressing as needed If the wound gets wet inside, change the dressing with sterile gauze. Please  use good hand washing techniques before changing the dressing. Do not use any lotions or creams on the incision until instructed by your surgeon.Keep your dressing dry with showering.  Keep your dressing dry with showering. You can keep it covered and pat dry.  STAPLE REMOVAL: Please remove staples two weeks post op and apply benzoin and 1/2 inch steri strips  ACTIVITY Walk with your walker as instructed. Use walker as long as suggested by your caregivers. Avoid periods of inactivity such as sitting longer than an hour when not asleep. This helps prevent blood clots.  You may resume a sexual relationship in one month or when given the OK by your doctor.  You may return to work once you are cleared by your doctor.  Do not drive a car for 6 weeks or until released by you surgeon.  Do not drive while taking narcotics.  WEIGHT BEARING Weight bearing as tolerated with assist device (walker, cane, etc) as directed, use it as long as suggested by your surgeon or therapist, typically at least 4-6 weeks.  POSTOPERATIVE CONSTIPATION PROTOCOL Constipation - defined medically as fewer than three stools per week and severe constipation as less than one stool per week.  One of the most common issues patients have following surgery is constipation. Even if you have a regular bowel pattern at home, your normal regimen is likely to be disrupted due to multiple reasons following surgery. Combination of anesthesia, postoperative narcotics, change in appetite and fluid intake all can affect your bowels. In  order to avoid complications following surgery, here are some recommendations in order to help you during your recovery period.  Colace (docusate) - Pick up an over-the-counter form of Colace or another stool softener and take twice a day as long as you are requiring postoperative pain medications. Take with a full glass of water daily. If you experience loose stools or diarrhea, hold the colace  until you stool forms back up. If your symptoms do not get better within 1 week or if they get worse, check with your doctor.  Dulcolax (bisacodyl) - Pick up over-the-counter and take as directed by the product packaging as needed to assist with the movement of your bowels. Take with a full glass of water. Use this product as needed if not relieved by Colace only.   MiraLax (polyethylene glycol) - Pick up over-the-counter to have on hand. MiraLax is a solution that will increase the amount of water in your bowels to assist with bowel movements. Take as directed and can mix with a glass of water, juice, soda, coffee, or tea. Take if you go more than two days without a movement. Do not use MiraLax more than once per day. Call your doctor if you are still constipated or irregular after using this medication for 7 days in a row.  If you continue to have problems with postoperative constipation, please contact the office for further assistance and recommendations. If you experience "the worst abdominal pain ever" or develop nausea or vomiting, please contact the office immediatly for further recommendations for treatment.  ITCHING If you experience itching with your medications, try taking only a single pain pill, or even half a pain pill at a time. You can also use Benadryl over the counter for itching or also to help with sleep.   TED HOSE STOCKINGS Wear the elastic stockings on both legs for 6 weeks following surgery during the day but you may remove then at night for sleeping.  MEDICATIONS See your medication summary on the After Visit Summary that the nursing staff will review with you prior to discharge. You may have some home medications which will be placed on hold until you complete the course of blood thinner medication. It is important for you to complete the blood thinner medication as prescribed by your surgeon. Continue your approved medications as instructed at time of  discharge.  PRECAUTIONS If you experience chest pain or shortness of breath - call 911 immediately for transfer to the hospital emergency department.  If you develop a fever greater that 101 F, purulent drainage from wound, increased redness or drainage from wound, foul odor from the wound/dressing, or calf pain - CONTACT YOUR SURGEON.    FOLLOW-UP APPOINTMENTS Make sure you keep all of your appointments after your operation with your surgeon and caregivers. You should call the office at the above phone number and make an appointment for approximately two weeks after the date of your surgery or on the date instructed by your surgeon outlined in the "After Visit Summary".  RANGE OF MOTION AND STRENGTHENING EXERCISES  These exercises are designed to help you keep full movement of your hip joint. Follow your caregiver's or physical therapist's instructions. Perform all exercises about fifteen times, three times per day or as directed. Exercise both hips, even if you have had only one joint replacement. These exercises can be done on a training (exercise) mat, on the floor, on a table or on a bed. Use whatever works the best and is most  comfortable for you. Use music or television while you are exercising so that the exercises are a pleasant break in your day. This will make your life better with the exercises acting as a break in routine you can look forward to.   Lying on your back, slowly slide your foot toward your buttocks, raising your knee up off the floor. Then slowly slide your foot back down until your leg is straight again.   Lying on your back spread your legs as far apart as you can without causing discomfort.   Lying on your side, raise your upper leg and foot straight up from the floor as far as is comfortable. Slowly lower the leg and repeat.   Lying on your back, tighten up the muscle in the front of your thigh (quadriceps muscles).  You can do this by keeping your leg straight and trying to raise your heel off the floor. This helps strengthen the largest muscle supporting your knee.   Lying on your back, tighten up the muscles of your buttocks both with the legs straight and with the knee bent at a comfortable angle while keeping your heel on the floor.  IF YOU ARE TRANSFERRED TO A SKILLED REHAB FACILITY If the patient is transferred to a skilled rehab facility following release from the hospital, a list of the current medications will be sent to the facility for the patient to continue. When discharged from the skilled rehab facility, please have the facility set up the patient's [place] prior to being released. Also, the skilled facility will be responsible for providing the patient with their medications at time of release from the facility to include their pain medication, the muscle relaxants, and their blood thinner medication. If the patient is still at the rehab facility at time of the two week follow up appointment, the skilled rehab facility will also need to assist the patient in arranging follow up appointment in our office and any transportation needs.  MAKE SURE YOU:   Understand these instructions.   Get help right away if you are not doing well or get worse.   Pick up stool softner and laxative for home use following surgery while on pain medications. Do not submerge incision under water. Please use good hand washing techniques while changing dressing each day. May shower starting three days after surgery. Please use a clean towel to pat the incision dry following showers. Continue to use ice for pain and swelling after surgery. Do not use any lotions or creams on the incision until instructed by your surgeon.

## 2016-11-07 NOTE — Discharge Summary (Signed)
Physician Discharge Summary  Patient ID: Claire Hansen MRN: [id number] DOB/AGE: 02-05-1960 56 y.o.  Admit date: 11/05/2016 Discharge date: 11/07/2016  Admission Diagnoses:  primary osteoarthritis of right hip   Discharge Diagnoses: Patient Active Problem List   Diagnosis Date Noted  . Primary localized osteoarthritis of right hip 11/05/2016  . Hyperlipidemia 08/13/2014  . History of asthma 08/13/2014  . Resistant hypertension 08/13/2014  . Microalbuminuria 08/13/2014  . Morbid obesity with BMI of 45.0-49.9, adult ([place]) 08/13/2014  . Osteoarthritis of right knee 08/13/2014  . Allergic rhinitis, seasonal 08/13/2014  . Diabetes mellitus with renal manifestation ([place]) 11/06/2010    Past Medical History:  Diagnosis Date  . Allergy   . Anxiety   . Asthma    pt denies having asthma  . Chronic osteoarthritis    knees  . Deviated septum   . Diabetes mellitus without complication ([place])   . Hyperlipidemia   . Hypertension   . Irregular menstrual cycle   . Lymphadenopathy   . Obesity   . Peri-menopause   . Pneumonia 2015     Transfusion: none   Consultants (if any):   Discharged Condition: Improved  Hospital Course: Claire Hansen is an 56 y.o. female who was admitted 11/05/2016 with a diagnosis of degenerative arthrosis right hip and went to the operating room on 11/05/2016 and underwent the above named procedures.    Surgeries:Procedure(s): TOTAL HIP ARTHROPLASTY ANTERIOR APPROACH on 11/05/2016  PRE-OPERATIVE DIAGNOSIS:  primary osteoarthritis of right hip  POST-OPERATIVE DIAGNOSIS:  primary osteoarthritis of right hip  PROCEDURE:  Procedure(s): TOTAL HIP ARTHROPLASTY ANTERIOR APPROACH (Right)  SURGEON: Laurene Footman, MD  ASSISTANTS: None  ANESTHESIA:   spinal  EBL:  Total I/O In: 1700 [I.V.:1700] Out: 600 [Urine:100; Blood:500]  BLOOD ADMINISTERED:none  DRAINS: (2) Hemovact drain(s) in the Subcutaneous layer with  Suction Open   LOCAL  MEDICATIONS USED:  MARCAINE     SPECIMEN:  Source of Specimen:  Right femoral head  DISPOSITION OF SPECIMEN:  PATHOLOGY  COUNTS:  YES  TOURNIQUET:  * No tourniquets in log *  IMPLANTS:  AMIS  1 standard, 28 mm S ceramic head, Mpact 54 mm DM cup and liner Patient tolerated the surgery well. No complications .Patient was taken to PACU where she was stabilized and then transferred to the orthopedic floor.  Patient started on Lovenox 30 q 12 hrs. Foot pumps applied bilaterally at 80 mm hgb. Heels elevated off bed with rolled towels. No evidence of DVT. Calves non tender. Negative Homan. Physical therapy started on day #1 for gait training and transfer with OT starting on  day #1 for ADL and assisted devices. Patient has done well with therapy. Ambulated greater than 200 feet upon being discharged.  Patient's IV And Foley were discontinued on day #1 with Hemovac being discontinued on day #2. Dressing was changed on day 2 prior to patient being discharged. Able to ascend and descend 4 steps safely and independently   She was given perioperative antibiotics:  Anti-infectives    Start     Dose/Rate Route Frequency Ordered Stop   11/05/16 1600  ceFAZolin (ANCEF) 2 g in dextrose 5 % 100 mL IVPB     2 g 240 mL/hr over 30 Minutes Intravenous Every 6 hours 11/05/16 1332 11/06/16 0749   11/04/16 2200  ceFAZolin (ANCEF) 3 g in dextrose 5 % 50 mL IVPB  Status:  Discontinued     3 g 130 mL/hr over 30 Minutes Intravenous  Once 11/04/16 2156 11/05/16 1331    .  She was fitted with AV 1 compression foot pump devices, instructed on heel pumps, early ambulation, and fitted with TED stockings bilaterally for DVT prophylaxis.  She benefited maximally from the hospital stay and there were no complications.    Recent vital signs:  Vitals:   11/06/16 2017 11/06/16 2138  BP: (!) 162/86   Pulse: 98   Resp:  18  Temp:  98.6 F (37 C)  SpO2:  92%    Recent laboratory studies:  Lab Results   Component Value Date   HGB 12.5 11/07/2016   HGB 13.0 11/06/2016   HGB 13.1 11/05/2016   Lab Results  Component Value Date   WBC 13.3 (H) 11/07/2016   PLT 259 11/07/2016   Lab Results  Component Value Date   INR 1.02 11/04/2016   Lab Results  Component Value Date   NA 135 11/07/2016   K 4.4 11/07/2016   CL 97 (L) 11/07/2016   CO2 28 11/07/2016   BUN 15 11/07/2016   CREATININE 1.17 (H) 11/07/2016   GLUCOSE 194 (H) 11/07/2016    Discharge Medications:   Allergies as of 11/07/2016      Reactions   Shellfish Allergy Anaphylaxis, Nausea And Vomiting   Topical betadine is OK to use.      Medication List    STOP taking these medications   etodolac 500 MG tablet Commonly known as:  LODINE   ibuprofen 200 MG tablet Commonly known as:  ADVIL,MOTRIN     TAKE these medications   acetaminophen 500 MG tablet Commonly known as:  TYLENOL Take 1,000 mg by mouth every 8 (eight) hours as needed for mild pain or headache. Reported on 05/15/2015   atorvastatin 40 MG tablet Commonly known as:  LIPITOR Take 1 tablet (40 mg total) by mouth daily.   CLARITIN 10 MG Caps Generic drug:  Loratadine Take 10 mg by mouth daily as needed (allergies).   enoxaparin 40 MG/0.4ML injection Commonly known as:  LOVENOX Inject 0.4 mLs (40 mg total) into the skin daily.   hydrALAZINE 10 MG tablet Commonly known as:  APRESOLINE Take 10 mg by mouth 2 (two) times daily.   meloxicam 15 MG tablet Commonly known as:  MOBIC Take 15 mg by mouth 2 (two) times daily as needed for pain.   metFORMIN 500 MG 24 hr tablet Commonly known as:  GLUCOPHAGE-XR TAKE 1 TABLET BY MOUTH  DAILY WITH BREAKFAST   metoprolol succinate 25 MG 24 hr tablet Commonly known as:  TOPROL-XL Take 25 mg by mouth 2 (two) times daily. Verified it is succinate and takes twice daily   montelukast 10 MG tablet Commonly known as:  SINGULAIR Take 1 tablet (10 mg total) by mouth daily. What changed:  when to take  this  reasons to take this   Olmesartan-Amlodipine-HCTZ 40-10-25 MG Tabs Take 1 tablet by mouth daily.   oxyCODONE 5 MG immediate release tablet Commonly known as:  Oxy IR/ROXICODONE Take 1-2 tablets (5-10 mg total) by mouth every 3 (three) hours as needed for breakthrough pain.   spironolactone 25 MG tablet Commonly known as:  ALDACTONE Take 25 mg by mouth daily.   traMADol 50 MG tablet Commonly known as:  ULTRAM Take 1-2 tablets (50-100 mg total) by mouth every 4 (four) hours as needed. What changed:  how much to take  when to take this       Diagnostic Studies: Dg Hip Operative Unilat W Or W/o Pelvis Right  Result Date: 11/05/2016 CLINICAL DATA:  Right total hip arthroplasty. EXAM: OPERATIVE right HIP (WITH PELVIS IF PERFORMED) 4 VIEWS TECHNIQUE: Fluoroscopic spot image(s) were submitted for interpretation post-operatively. COMPARISON:  None FINDINGS: Initial image shows severe degenerative disease involving the right hip. Subsequent placement of a femoral reamer/sizer in the right hip followed by the femoral and acetabular components. No complicating features are demonstrated. IMPRESSION: Right total hip arthroplasty without complicating features. Electronically Signed   By: Marijo Sanes M.D.   On: 11/05/2016 11:54   Dg Hip Unilat W Or W/o Pelvis 2-3 Views Right  Result Date: 11/05/2016 CLINICAL DATA:  Total hip replacement. EXAM: DG HIP (WITH OR WITHOUT PELVIS) 2-3V RIGHT COMPARISON:  Intraoperative x-rays from same date. FINDINGS: The patient is rotated on the AP view. Postsurgical changes related to right total hip arthroplasty. Components are well aligned. No fracture or dislocation. Expected postsurgical changes about the right hip including subcutaneous emphysema, surgical drains, and skin staples. IMPRESSION: Postsurgical changes related to right total hip arthroplasty without evidence of acute postoperative complication. Electronically Signed   By: Titus Dubin M.D.    On: 11/05/2016 12:46    Disposition:   Discharge Instructions    Diet - low sodium heart healthy    Complete by:  As directed    Increase activity slowly    Complete by:  As directed          Signed: WOLFE,JON R. 11/07/2016, 7:51 AM

## 2016-11-07 NOTE — Care Management Note (Signed)
Case Management Note  Patient Details  Name: Claire Hansen MRN: [id number] Date of Birth: 01/21/1961  Subjective/Objective:   New discharge instructions from Vance Peper PA to discharge home with Provena wound vac.                  Action/Plan:   Expected Discharge Date:  11/07/16               Expected Discharge Plan:  [place]  In-House Referral:  NA  Discharge planning Services  CM Consult  Post Acute Care Choice:  Home Health Choice offered to:  Patient, Spouse  DME Arranged:  N/A DME Agency:  NA  HH Arranged:  PT, RN [place] Agency:  [place] (now [facility])  Status of Service:  Completed, signed off  If discussed at [employer] of Stay Meetings, dates discussed:    Additional Comments:  Sofija Antwi A, RN 11/07/2016, 9:26 AM

## 2016-11-07 NOTE — Care Management Note (Addendum)
Case Management Note  Patient Details  Name: Claire Hansen MRN: [id number] Date of Birth: August 21, 1960  Subjective/Objective:     Claire Hansen has already agreed to home with home health with [facility] with CM earlier this week.. A referral was called to Verdis Frederickson at [facility] for HH=PT, Therapist, sports. Claire Hansen already has a RW. Updated Claire Hansen that someone from [place] would be reaching out to her tomorrow. Spoke to [employer] about discharge instructions for discharge with 2 honeycomb dressings and TED hose. Claire Hansen already aware.            Action/Plan:   Expected Discharge Date:  11/07/16               Expected Discharge Plan:  [place]  In-House Referral:  NA  Discharge planning Services  CM Consult  Post Acute Care Choice:  Home Health Choice offered to:  Patient, Spouse  DME Arranged:  N/A DME Agency:  NA  HH Arranged:  PT, RN [place] Agency:  [place] (now [facility])  Status of Service:  Completed, signed off  If discussed at [employer] of Stay Meetings, dates discussed:    Additional Comments:  Claire Hansen A, RN 11/07/2016, 9:07 AM

## 2016-11-07 NOTE — Progress Notes (Addendum)
   Subjective: 2 Days Post-Op Procedure(s) (LRB): TOTAL HIP ARTHROPLASTY ANTERIOR APPROACH (Right) Patient reports pain as 7 on 0-10 scale.    Patient is well, and has had no acute complaints or problems Pt did very well with therapy Plan is to go home. no nausea and no vomiting Patient denies any chest pains or shortness of breath. Objective: Vital signs in last 24 hours: Temp:  [98.6 F (37 C)-98.9 F (37.2 C)] 98.6 F (37 C) (10/12 2138) Pulse Rate:  [68-98] 98 (10/12 2017) Resp:  [17-18] 18 (10/12 2138) BP: (151-162)/(79-86) 162/86 (10/12 2017) SpO2:  [92 %-95 %] 92 % (10/12 2138) well approximated incision. Wound vac in place Heels are non tender and elevated off the bed using rolled towels Intake/Output from previous day: 10/12 0701 - 10/13 0700 In: 2003.3 [P.O.:240; I.V.:1763.3] Out: 110 [Drains:110] Intake/Output this shift: No intake/output data recorded.   Recent Labs  11/04/16 0832 11/05/16 1648 11/06/16 0333 11/07/16 0324  HGB 14.9 13.1 13.0 12.5    Recent Labs  11/06/16 0333 11/07/16 0324  WBC 11.0 13.3*  RBC 4.45 4.22  HCT 38.7 36.8  PLT 285 259    Recent Labs  11/06/16 0333 11/07/16 0324  NA 138 135  K 3.4* 4.4  CL 102 97*  CO2 29 28  BUN 13 15  CREATININE 0.86 1.17*  GLUCOSE 183* 194*  CALCIUM 9.2 9.5    Recent Labs  11/04/16 0832  INR 1.02    EXAM General - Patient is Alert, Appropriate and Oriented Extremity - Neurologically intact Neurovascular intact Sensation intact distally Intact pulses distally Dorsiflexion/Plantar flexion intact No cellulitis present Compartment soft Dressing - wound vac in place Motor Function - intact, moving foot and toes well on exam.    Past Medical History:  Diagnosis Date  . Allergy   . Anxiety   . Asthma    pt denies having asthma  . Chronic osteoarthritis    knees  . Deviated septum   . Diabetes mellitus without complication ([place])   . Hyperlipidemia   . Hypertension   .  Irregular menstrual cycle   . Lymphadenopathy   . Obesity   . Peri-menopause   . Pneumonia 2015    Assessment/Plan: 2 Days Post-Op Procedure(s) (LRB): TOTAL HIP ARTHROPLASTY ANTERIOR APPROACH (Right) Active Problems:   Primary localized osteoarthritis of right hip  Estimated body mass index is 46.59 kg/m as calculated from the following:   Height as of this encounter: 5\' 5"  (1.651 m).   Weight as of this encounter: 127 kg (280 lb). Up with therapy Discharge home with home health  Labs: reviewed DVT Prophylaxis - Lovenox, Foot Pumps and TED hose Weight-Bearing as tolerated to left leg Change wound vac to prevena prior to d/c  Hildy Nicholl R. Mulvane Graham 11/07/2016, 7:26 AM

## 2016-11-09 ENCOUNTER — Encounter: Payer: Self-pay | Admitting: Orthopedic Surgery

## 2016-11-09 LAB — SURGICAL PATHOLOGY

## 2016-11-09 NOTE — Care Management (Signed)
Post discharge call from patient's family requesting update on [facility] for home health services 11/09/16.  Email sent to Tim/Jerry with [facility] to follow up with patient.

## 2016-11-23 ENCOUNTER — Other Ambulatory Visit: Payer: Self-pay | Admitting: Family Medicine

## 2016-11-23 DIAGNOSIS — Z1231 Encounter for screening mammogram for malignant neoplasm of breast: Secondary | ICD-10-CM

## 2016-12-21 ENCOUNTER — Ambulatory Visit
Admission: RE | Admit: 2016-12-21 | Discharge: 2016-12-21 | Disposition: A | Discharge status: Home / Self Care | Payer: 59 | Source: Ambulatory Visit | Attending: Family Medicine | Admitting: Family Medicine

## 2016-12-21 DIAGNOSIS — Z1231 Encounter for screening mammogram for malignant neoplasm of breast: Secondary | ICD-10-CM | POA: Diagnosis present

## 2016-12-24 ENCOUNTER — Other Ambulatory Visit: Payer: Self-pay | Admitting: Family Medicine

## 2016-12-24 DIAGNOSIS — N6310 Unspecified lump in the right breast, unspecified quadrant: Secondary | ICD-10-CM

## 2016-12-24 DIAGNOSIS — R928 Other abnormal and inconclusive findings on diagnostic imaging of breast: Secondary | ICD-10-CM

## 2016-12-30 ENCOUNTER — Ambulatory Visit: Payer: 59

## 2017-01-06 ENCOUNTER — Ambulatory Visit
Admission: RE | Admit: 2017-01-06 | Discharge: 2017-01-06 | Disposition: A | Discharge status: Home / Self Care | Payer: 59 | Source: Ambulatory Visit | Attending: Family Medicine | Admitting: Family Medicine

## 2017-01-06 DIAGNOSIS — R928 Other abnormal and inconclusive findings on diagnostic imaging of breast: Secondary | ICD-10-CM | POA: Insufficient documentation

## 2017-01-06 DIAGNOSIS — N6310 Unspecified lump in the right breast, unspecified quadrant: Secondary | ICD-10-CM | POA: Insufficient documentation

## 2017-06-23 ENCOUNTER — Other Ambulatory Visit: Payer: Self-pay

## 2017-06-23 ENCOUNTER — Encounter
Admission: RE | Admit: 2017-06-23 | Discharge: 2017-06-23 | Disposition: A | Discharge status: Home / Self Care | Payer: Managed Care, Other (non HMO) | Source: Ambulatory Visit | Attending: Orthopedic Surgery | Admitting: Orthopedic Surgery

## 2017-06-23 DIAGNOSIS — Z01812 Encounter for preprocedural laboratory examination: Secondary | ICD-10-CM | POA: Diagnosis present

## 2017-06-23 DIAGNOSIS — N926 Irregular menstruation, unspecified: Secondary | ICD-10-CM | POA: Insufficient documentation

## 2017-06-23 DIAGNOSIS — E785 Hyperlipidemia, unspecified: Secondary | ICD-10-CM | POA: Diagnosis not present

## 2017-06-23 DIAGNOSIS — E119 Type 2 diabetes mellitus without complications: Secondary | ICD-10-CM | POA: Diagnosis not present

## 2017-06-23 DIAGNOSIS — Z01810 Encounter for preprocedural cardiovascular examination: Secondary | ICD-10-CM | POA: Insufficient documentation

## 2017-06-23 DIAGNOSIS — R590 Localized enlarged lymph nodes: Secondary | ICD-10-CM | POA: Insufficient documentation

## 2017-06-23 DIAGNOSIS — I10 Essential (primary) hypertension: Secondary | ICD-10-CM | POA: Insufficient documentation

## 2017-06-23 DIAGNOSIS — F419 Anxiety disorder, unspecified: Secondary | ICD-10-CM | POA: Insufficient documentation

## 2017-06-23 LAB — TYPE AND SCREEN
ABO/RH(D): O POS
ANTIBODY SCREEN: NEGATIVE

## 2017-06-23 LAB — BASIC METABOLIC PANEL
Anion gap: 11 (ref 5–15)
BUN: 14 mg/dL (ref 6–20)
CO2: 24 mmol/L (ref 22–32)
CREATININE: 0.95 mg/dL (ref 0.44–1.00)
Calcium: 10.1 mg/dL (ref 8.9–10.3)
Chloride: 105 mmol/L (ref 101–111)
GFR calc non Af Amer: >60 mL/min (ref >60)
GLUCOSE: 152 mg/dL — AB (ref 65–99)
Potassium: 3.6 mmol/L (ref 3.5–5.1)
Sodium: 140 mmol/L (ref 135–145)

## 2017-06-23 LAB — URINALYSIS, ROUTINE W REFLEX MICROSCOPIC
BILIRUBIN URINE: NEGATIVE
GLUCOSE, UA: NEGATIVE mg/dL
HGB URINE DIPSTICK: NEGATIVE
Ketones, ur: NEGATIVE mg/dL
LEUKOCYTES UA: NEGATIVE
Nitrite: NEGATIVE
PROTEIN: 100 mg/dL — AB
Specific Gravity, Urine: 1.025 (ref 1.005–1.030)
pH: 5 (ref 5.0–8.0)

## 2017-06-23 LAB — CBC
HEMATOCRIT: 43.2 % (ref 35.0–47.0)
Hemoglobin: 14.8 g/dL (ref 12.0–16.0)
MCH: 29.9 pg (ref 26.0–34.0)
MCHC: 34.2 g/dL (ref 32.0–36.0)
MCV: 87.5 fL (ref 80.0–100.0)
PLATELETS: 292 10*3/uL (ref 150–440)
RBC: 4.94 MIL/uL (ref 3.80–5.20)
RDW: 15.8 % — AB (ref 11.5–14.5)
WBC: 8.6 10*3/uL (ref 3.6–11.0)

## 2017-06-23 LAB — SURGICAL PCR SCREEN
MRSA, PCR: NEGATIVE
Staphylococcus aureus: POSITIVE — AB

## 2017-06-23 LAB — PROTIME-INR
INR: 0.98
Prothrombin Time: 12.9 seconds (ref 11.4–15.2)

## 2017-06-23 LAB — SEDIMENTATION RATE: Sed Rate: 7 mm/hr (ref 0–30)

## 2017-06-23 LAB — APTT: aPTT: 30 seconds (ref 24–36)

## 2017-06-23 NOTE — Patient Instructions (Signed)
Your procedure is scheduled on: Thursday 07/01/17 Report to [place]. To find out your arrival time please call [phone] between 1PM - 3PM on Wednesday 06/30/17.  Remember: Instructions that are not followed completely may result in serious medical risk, up to and including death, or upon the discretion of your surgeon and anesthesiologist your surgery may need to be rescheduled.     _X__ 1. Do not eat food after midnight the night before your procedure.                 No gum chewing or hard candies. You may drink clear liquids up to 2 hours                 before you are scheduled to arrive for your surgery- DO not drink clear                 liquids within 2 hours of the start of your surgery.                 Clear Liquids include:  water, apple juice without pulp, clear carbohydrate                 drink such as Clearfast or Gatorade, Black Coffee or Tea (Do not add                 anything to coffee or tea).  __X__2.  On the morning of surgery brush your teeth with toothpaste and water, you                 may rinse your mouth with mouthwash if you wish.  Do not swallow any              toothpaste of mouthwash.     _X__ 3.  No Alcohol for 24 hours before or after surgery.   _X__ 4.  Do Not Smoke or use e-cigarettes For 24 Hours Prior to Your Surgery.                 Do not use any chewable tobacco products for at least 6 hours prior to                 surgery.  ____  5.  Bring all medications with you on the day of surgery if instructed.   __X__  6.  Notify your doctor if there is any change in your medical condition      (cold, fever, infections).     Do not wear jewelry, make-up, hairpins, clips or nail polish. Do not wear lotions, powders, or perfumes.  Do not shave 48 hours prior to surgery. Men may shave face and neck. Do not bring valuables to the hospital.    [facility] is not responsible for any belongings or  valuables.  Contacts, dentures/partials or body piercings may not be worn into surgery. Bring a case for your contacts, glasses or hearing aids, a denture cup will be supplied. Leave your suitcase in the car. After surgery it may be brought to your room. For patients admitted to the hospital, discharge time is determined by your treatment team.   Patients discharged the day of surgery will not be allowed to drive home.   Please read over the following fact sheets that you were given:   MRSA Information  __X__ Take these medicines the morning of surgery with A SIP OF WATER:  1. Hydralazine  2. Metoprolol  3. Montelukast  4. May take Tramadol if needed  5.  6.  ____ Fleet Enema (as directed)   __X__ Use CHG Soap/SAGE wipes as directed  ____ Use inhalers on the day of surgery  __X__ Stop metformin/Janumet/Farxiga 2 days prior to surgery    ____ Take 1/2 of usual insulin dose the night before surgery. No insulin the morning          of surgery.   __X__ Stop Blood Thinners Coumadin/Plavix/Xarelto/Pleta/Pradaxa/Eliquis/Effient/Aspirin  on TODAY  Or contact your Surgeon, Cardiologist or Medical Doctor regarding  ability to stop your blood thinners  __X__ Stop Anti-inflammatories 7 days before surgery such as Advil, Ibuprofen, Motrin,  BC or Goodies Powder, Naprosyn, Naproxen, Aleve, Aspirin TODAY MAY TAKE TYLENOL   __X__ Stop all herbal supplements, fish oil or vitamin E until after surgery.    ____ Bring C-Pap to the hospital.

## 2017-06-24 NOTE — Pre-Procedure Instructions (Signed)
LABS FAXED TO DR MENZ 

## 2017-06-25 LAB — URINE CULTURE

## 2017-06-30 MED ORDER — DEXTROSE 5 % IV SOLN
3.0000 g | Freq: Once | INTRAVENOUS | Status: AC
  Administered 2017-07-01: 2 g via INTRAVENOUS
  Filled 2017-06-30: qty 3

## 2017-06-30 MED ORDER — TRANEXAMIC ACID 1000 MG/10ML IV SOLN
1000.0000 mg | INTRAVENOUS | Status: AC
  Administered 2017-07-01: 1000 mg via INTRAVENOUS
  Filled 2017-06-30: qty 10

## 2017-07-01 ENCOUNTER — Inpatient Hospital Stay: Payer: Managed Care, Other (non HMO) | Admitting: Anesthesiology

## 2017-07-01 ENCOUNTER — Inpatient Hospital Stay
Admission: RE | Admit: 2017-07-01 | Discharge: 2017-07-03 | DRG: 470 | Disposition: A | Discharge status: Home Health | Payer: Managed Care, Other (non HMO) | Source: Ambulatory Visit | Attending: Orthopedic Surgery | Admitting: Orthopedic Surgery

## 2017-07-01 ENCOUNTER — Inpatient Hospital Stay: Payer: Managed Care, Other (non HMO)

## 2017-07-01 ENCOUNTER — Encounter: Payer: Self-pay | Admitting: *Deleted

## 2017-07-01 ENCOUNTER — Encounter
Admission: RE | Disposition: A | Discharge status: Home Health | Payer: Self-pay | Source: Ambulatory Visit | Attending: Orthopedic Surgery

## 2017-07-01 ENCOUNTER — Other Ambulatory Visit: Payer: Self-pay

## 2017-07-01 DIAGNOSIS — Z419 Encounter for procedure for purposes other than remedying health state, unspecified: Secondary | ICD-10-CM

## 2017-07-01 DIAGNOSIS — Z7982 Long term (current) use of aspirin: Secondary | ICD-10-CM | POA: Diagnosis not present

## 2017-07-01 DIAGNOSIS — Z79899 Other long term (current) drug therapy: Secondary | ICD-10-CM | POA: Diagnosis not present

## 2017-07-01 DIAGNOSIS — E785 Hyperlipidemia, unspecified: Secondary | ICD-10-CM | POA: Diagnosis present

## 2017-07-01 DIAGNOSIS — E119 Type 2 diabetes mellitus without complications: Secondary | ICD-10-CM | POA: Diagnosis present

## 2017-07-01 DIAGNOSIS — Z7984 Long term (current) use of oral hypoglycemic drugs: Secondary | ICD-10-CM

## 2017-07-01 DIAGNOSIS — M170 Bilateral primary osteoarthritis of knee: Secondary | ICD-10-CM | POA: Diagnosis present

## 2017-07-01 DIAGNOSIS — Z6841 Body Mass Index (BMI) 40.0 and over, adult: Secondary | ICD-10-CM

## 2017-07-01 DIAGNOSIS — M1612 Unilateral primary osteoarthritis, left hip: Secondary | ICD-10-CM | POA: Diagnosis present

## 2017-07-01 DIAGNOSIS — G8918 Other acute postprocedural pain: Secondary | ICD-10-CM

## 2017-07-01 DIAGNOSIS — Z91013 Allergy to seafood: Secondary | ICD-10-CM

## 2017-07-01 DIAGNOSIS — I10 Essential (primary) hypertension: Secondary | ICD-10-CM | POA: Diagnosis present

## 2017-07-01 HISTORY — PX: TOTAL HIP ARTHROPLASTY: SHX124

## 2017-07-01 LAB — CBC
HEMATOCRIT: 38.2 % (ref 35.0–47.0)
HEMOGLOBIN: 12.7 g/dL (ref 12.0–16.0)
MCH: 29.2 pg (ref 26.0–34.0)
MCHC: 33.3 g/dL (ref 32.0–36.0)
MCV: 87.8 fL (ref 80.0–100.0)
Platelets: 304 10*3/uL (ref 150–440)
RBC: 4.35 MIL/uL (ref 3.80–5.20)
RDW: 15.3 % — AB (ref 11.5–14.5)
WBC: 19.7 10*3/uL — ABNORMAL HIGH (ref 3.6–11.0)

## 2017-07-01 LAB — POCT PREGNANCY, URINE: PREG TEST UR: NEGATIVE

## 2017-07-01 LAB — CREATININE, SERUM
Creatinine, Ser: 1 mg/dL (ref 0.44–1.00)
GFR calc Af Amer: >60 mL/min (ref >60)
GFR calc non Af Amer: >60 mL/min (ref >60)

## 2017-07-01 LAB — GLUCOSE, CAPILLARY
GLUCOSE-CAPILLARY: 200 mg/dL — AB (ref 65–99)
Glucose-Capillary: 236 mg/dL — ABNORMAL HIGH (ref 65–99)

## 2017-07-01 SURGERY — ARTHROPLASTY, HIP, TOTAL, ANTERIOR APPROACH
Anesthesia: Spinal | Laterality: Left | Wound class: Clean

## 2017-07-01 MED ORDER — KETOROLAC TROMETHAMINE 30 MG/ML IJ SOLN: 30.0000 mg | Freq: Once | INTRAMUSCULAR | Status: DC | PRN

## 2017-07-01 MED ORDER — OLMESARTAN-AMLODIPINE-HCTZ 40-10-25 MG PO TABS: 1.0000 | ORAL_TABLET | Freq: Every day | ORAL | Status: DC

## 2017-07-01 MED ORDER — DEXTROSE 5 % IV SOLN
3.0000 g | Freq: Four times a day (QID) | INTRAVENOUS | Status: AC
  Administered 2017-07-01 – 2017-07-02 (×3): 3 g via INTRAVENOUS
  Filled 2017-07-01 (×3): qty 3

## 2017-07-01 MED ORDER — OXYCODONE HCL 5 MG PO TABS
5.0000 mg | ORAL_TABLET | ORAL | Status: DC | PRN
  Administered 2017-07-01 – 2017-07-02 (×2): 10 mg via ORAL
  Administered 2017-07-02 (×3): 5 mg via ORAL
  Administered 2017-07-03 (×2): 10 mg via ORAL
  Filled 2017-07-01 (×2): qty 1
  Filled 2017-07-01 (×3): qty 2
  Filled 2017-07-01: qty 1

## 2017-07-01 MED ORDER — MENTHOL 3 MG MT LOZG
1.0000 | LOZENGE | OROMUCOSAL | Status: DC | PRN
  Filled 2017-07-01: qty 9

## 2017-07-01 MED ORDER — OXYCODONE HCL 5 MG PO TABS
10.0000 mg | ORAL_TABLET | ORAL | Status: DC | PRN
  Administered 2017-07-01: 10 mg via ORAL
  Filled 2017-07-01 (×2): qty 2

## 2017-07-01 MED ORDER — ACETAMINOPHEN 10 MG/ML IV SOLN
INTRAVENOUS | Status: AC
  Filled 2017-07-01: qty 100

## 2017-07-01 MED ORDER — LABETALOL HCL 5 MG/ML IV SOLN
INTRAVENOUS | Status: AC
  Filled 2017-07-01: qty 4

## 2017-07-01 MED ORDER — BUPIVACAINE-EPINEPHRINE (PF) 0.25% -1:200000 IJ SOLN
INTRAMUSCULAR | Status: AC
Start: 2017-07-01 — End: ?
  Filled 2017-07-01: qty 30

## 2017-07-01 MED ORDER — BUPIVACAINE-EPINEPHRINE 0.25% -1:200000 IJ SOLN
INTRAMUSCULAR | Status: DC | PRN
  Administered 2017-07-01: 30 mL

## 2017-07-01 MED ORDER — FAMOTIDINE 20 MG PO TABS
ORAL_TABLET | ORAL | Status: AC
  Administered 2017-07-01: 20 mg via ORAL
  Filled 2017-07-01: qty 1

## 2017-07-01 MED ORDER — METOPROLOL SUCCINATE ER 25 MG PO TB24
25.0000 mg | ORAL_TABLET | Freq: Two times a day (BID) | ORAL | Status: DC
  Administered 2017-07-01 – 2017-07-02 (×3): 25 mg via ORAL
  Filled 2017-07-01 (×3): qty 1

## 2017-07-01 MED ORDER — PROPOFOL 10 MG/ML IV BOLUS
INTRAVENOUS | Status: AC
  Filled 2017-07-01: qty 20

## 2017-07-01 MED ORDER — BUPIVACAINE HCL (PF) 0.5 % IJ SOLN
INTRAMUSCULAR | Status: DC | PRN
  Administered 2017-07-01: 3 mL

## 2017-07-01 MED ORDER — MAGNESIUM CITRATE PO SOLN
1.0000 | Freq: Once | ORAL | Status: DC | PRN
  Filled 2017-07-01: qty 296

## 2017-07-01 MED ORDER — METHOCARBAMOL 1000 MG/10ML IJ SOLN
500.0000 mg | Freq: Four times a day (QID) | INTRAVENOUS | Status: DC | PRN
  Filled 2017-07-01: qty 5

## 2017-07-01 MED ORDER — ONDANSETRON HCL 4 MG/2ML IJ SOLN: 4.0000 mg | Freq: Four times a day (QID) | INTRAMUSCULAR | Status: DC | PRN

## 2017-07-01 MED ORDER — ACETAMINOPHEN 10 MG/ML IV SOLN
INTRAVENOUS | Status: DC | PRN
  Administered 2017-07-01: 1000 mg via INTRAVENOUS

## 2017-07-01 MED ORDER — ACETAMINOPHEN 500 MG PO TABS
1000.0000 mg | ORAL_TABLET | Freq: Four times a day (QID) | ORAL | Status: AC
  Administered 2017-07-01 – 2017-07-02 (×3): 1000 mg via ORAL
  Filled 2017-07-01 (×4): qty 2

## 2017-07-01 MED ORDER — PHENOL 1.4 % MT LIQD
1.0000 | OROMUCOSAL | Status: DC | PRN
  Filled 2017-07-01: qty 177

## 2017-07-01 MED ORDER — PROPOFOL 500 MG/50ML IV EMUL
INTRAVENOUS | Status: AC
  Filled 2017-07-01: qty 50

## 2017-07-01 MED ORDER — METOCLOPRAMIDE HCL 10 MG PO TABS: 5.0000 mg | ORAL_TABLET | Freq: Three times a day (TID) | ORAL | Status: DC | PRN

## 2017-07-01 MED ORDER — EPHEDRINE SULFATE 50 MG/ML IJ SOLN
INTRAMUSCULAR | Status: DC | PRN
  Administered 2017-07-01 (×2): 10 mg via INTRAVENOUS

## 2017-07-01 MED ORDER — HYDROCHLOROTHIAZIDE 25 MG PO TABS
25.0000 mg | ORAL_TABLET | Freq: Every day | ORAL | Status: DC
  Administered 2017-07-01: 25 mg via ORAL
  Filled 2017-07-01: qty 1

## 2017-07-01 MED ORDER — ENOXAPARIN SODIUM 40 MG/0.4ML ~~LOC~~ SOLN: 40.0000 mg | SUBCUTANEOUS | Status: DC

## 2017-07-01 MED ORDER — METFORMIN HCL ER 500 MG PO TB24
500.0000 mg | ORAL_TABLET | Freq: Every day | ORAL | Status: DC
  Administered 2017-07-02 – 2017-07-03 (×2): 500 mg via ORAL
  Filled 2017-07-01 (×3): qty 1

## 2017-07-01 MED ORDER — MONTELUKAST SODIUM 10 MG PO TABS: 10.0000 mg | ORAL_TABLET | Freq: Every day | ORAL | Status: DC | PRN

## 2017-07-01 MED ORDER — PROMETHAZINE HCL 25 MG/ML IJ SOLN: 6.2500 mg | INTRAMUSCULAR | Status: DC | PRN

## 2017-07-01 MED ORDER — FAMOTIDINE 20 MG PO TABS
20.0000 mg | ORAL_TABLET | Freq: Once | ORAL | Status: AC
  Administered 2017-07-01: 20 mg via ORAL

## 2017-07-01 MED ORDER — LORATADINE 10 MG PO TABS: 10.0000 mg | ORAL_TABLET | Freq: Every day | ORAL | Status: DC | PRN

## 2017-07-01 MED ORDER — SODIUM CHLORIDE 0.9 % IV SOLN
INTRAVENOUS | Status: DC
  Administered 2017-07-01: 12:00:00 via INTRAVENOUS

## 2017-07-01 MED ORDER — ACETAMINOPHEN 325 MG PO TABS: 325.0000 mg | ORAL_TABLET | ORAL | Status: DC | PRN

## 2017-07-01 MED ORDER — METOCLOPRAMIDE HCL 5 MG/ML IJ SOLN: 5.0000 mg | Freq: Three times a day (TID) | INTRAMUSCULAR | Status: DC | PRN

## 2017-07-01 MED ORDER — ACETAMINOPHEN 325 MG PO TABS: 325.0000 mg | ORAL_TABLET | Freq: Four times a day (QID) | ORAL | Status: DC | PRN

## 2017-07-01 MED ORDER — DIPHENHYDRAMINE HCL 12.5 MG/5ML PO ELIX: 12.5000 mg | ORAL_SOLUTION | ORAL | Status: DC | PRN

## 2017-07-01 MED ORDER — PROPOFOL 500 MG/50ML IV EMUL
INTRAVENOUS | Status: DC | PRN
  Administered 2017-07-01: 50 ug/kg/min via INTRAVENOUS

## 2017-07-01 MED ORDER — SENNOSIDES-DOCUSATE SODIUM 8.6-50 MG PO TABS: 1.0000 | ORAL_TABLET | Freq: Every evening | ORAL | Status: DC | PRN

## 2017-07-01 MED ORDER — ACETAMINOPHEN 160 MG/5ML PO SOLN
325.0000 mg | ORAL | Status: DC | PRN
  Filled 2017-07-01: qty 20.3

## 2017-07-01 MED ORDER — HYDRALAZINE HCL 10 MG PO TABS
10.0000 mg | ORAL_TABLET | Freq: Three times a day (TID) | ORAL | Status: DC
  Administered 2017-07-01 – 2017-07-02 (×4): 10 mg via ORAL
  Filled 2017-07-01 (×9): qty 1

## 2017-07-01 MED ORDER — EPHEDRINE SULFATE 50 MG/ML IJ SOLN
INTRAMUSCULAR | Status: AC
  Filled 2017-07-01: qty 1

## 2017-07-01 MED ORDER — ENOXAPARIN SODIUM 40 MG/0.4ML ~~LOC~~ SOLN
40.0000 mg | Freq: Two times a day (BID) | SUBCUTANEOUS | Status: DC
  Administered 2017-07-02 – 2017-07-03 (×3): 40 mg via SUBCUTANEOUS
  Filled 2017-07-01 (×3): qty 0.4

## 2017-07-01 MED ORDER — NEOMYCIN-POLYMYXIN B GU 40-200000 IR SOLN
Status: DC | PRN
  Administered 2017-07-01: 4 mL

## 2017-07-01 MED ORDER — HYDROMORPHONE HCL 1 MG/ML IJ SOLN: 0.2500 mg | INTRAMUSCULAR | Status: DC | PRN

## 2017-07-01 MED ORDER — ONDANSETRON HCL 4 MG PO TABS: 4.0000 mg | ORAL_TABLET | Freq: Four times a day (QID) | ORAL | Status: DC | PRN

## 2017-07-01 MED ORDER — ALUM & MAG HYDROXIDE-SIMETH 200-200-20 MG/5ML PO SUSP: 30.0000 mL | ORAL | Status: DC | PRN

## 2017-07-01 MED ORDER — METHOCARBAMOL 500 MG PO TABS
500.0000 mg | ORAL_TABLET | Freq: Four times a day (QID) | ORAL | Status: DC | PRN
  Administered 2017-07-01 – 2017-07-02 (×2): 500 mg via ORAL
  Filled 2017-07-01 (×2): qty 1

## 2017-07-01 MED ORDER — DOCUSATE SODIUM 100 MG PO CAPS
100.0000 mg | ORAL_CAPSULE | Freq: Two times a day (BID) | ORAL | Status: DC
  Administered 2017-07-01 – 2017-07-02 (×3): 100 mg via ORAL
  Filled 2017-07-01 (×3): qty 1

## 2017-07-01 MED ORDER — AMLODIPINE BESYLATE 10 MG PO TABS
10.0000 mg | ORAL_TABLET | Freq: Every day | ORAL | Status: DC
  Administered 2017-07-01 – 2017-07-02 (×2): 10 mg via ORAL
  Filled 2017-07-01 (×2): qty 1

## 2017-07-01 MED ORDER — BISACODYL 10 MG RE SUPP: 10.0000 mg | Freq: Every day | RECTAL | Status: DC | PRN

## 2017-07-01 MED ORDER — IRBESARTAN 150 MG PO TABS
300.0000 mg | ORAL_TABLET | Freq: Every day | ORAL | Status: DC
Start: 2017-07-01 — End: 2017-07-03
  Administered 2017-07-01 – 2017-07-02 (×2): 300 mg via ORAL
  Filled 2017-07-01 (×2): qty 2

## 2017-07-01 MED ORDER — SODIUM CHLORIDE 0.9 % IV SOLN
INTRAVENOUS | Status: DC
  Administered 2017-07-01 (×2): via INTRAVENOUS

## 2017-07-01 MED ORDER — ZOLPIDEM TARTRATE 5 MG PO TABS: 5.0000 mg | ORAL_TABLET | Freq: Every evening | ORAL | Status: DC | PRN

## 2017-07-01 MED ORDER — NEOMYCIN-POLYMYXIN B GU 40-200000 IR SOLN
Status: AC
  Filled 2017-07-01: qty 2

## 2017-07-01 MED ORDER — HYDROCODONE-ACETAMINOPHEN 7.5-325 MG PO TABS
1.0000 | ORAL_TABLET | Freq: Once | ORAL | Status: DC | PRN
  Filled 2017-07-01: qty 1

## 2017-07-01 MED ORDER — TRAMADOL HCL 50 MG PO TABS
50.0000 mg | ORAL_TABLET | Freq: Four times a day (QID) | ORAL | Status: DC
  Administered 2017-07-01 – 2017-07-03 (×8): 50 mg via ORAL
  Filled 2017-07-01 (×9): qty 1

## 2017-07-01 MED ORDER — MEPERIDINE HCL 50 MG/ML IJ SOLN: 6.2500 mg | INTRAMUSCULAR | Status: DC | PRN

## 2017-07-01 MED ORDER — SODIUM CHLORIDE 0.9 % IV SOLN
INTRAVENOUS | Status: DC | PRN
  Administered 2017-07-01: 50 ug/min via INTRAVENOUS

## 2017-07-01 MED ORDER — HYDROMORPHONE HCL 1 MG/ML IJ SOLN
0.5000 mg | INTRAMUSCULAR | Status: DC | PRN
  Administered 2017-07-01: 1 mg via INTRAVENOUS
  Filled 2017-07-01: qty 1

## 2017-07-01 MED ORDER — MIDAZOLAM HCL 2 MG/2ML IJ SOLN
INTRAMUSCULAR | Status: AC
  Filled 2017-07-01: qty 2

## 2017-07-01 MED ORDER — LABETALOL HCL 5 MG/ML IV SOLN
5.0000 mg | Freq: Once | INTRAVENOUS | Status: AC
  Administered 2017-07-01: 07:00:00 via INTRAVENOUS

## 2017-07-01 MED ORDER — MIDAZOLAM HCL 5 MG/5ML IJ SOLN
INTRAMUSCULAR | Status: DC | PRN
  Administered 2017-07-01: 2 mg via INTRAVENOUS

## 2017-07-01 MED ORDER — SPIRONOLACTONE 25 MG PO TABS
25.0000 mg | ORAL_TABLET | Freq: Every day | ORAL | Status: DC
  Administered 2017-07-01: 25 mg via ORAL
  Filled 2017-07-01: qty 1

## 2017-07-01 SURGICAL SUPPLY — 55 items
BLADE SAGITTAL AGGR TOOTH XLG (BLADE) ×3 IMPLANT
BNDG COHESIVE 6X5 TAN STRL LF (GAUZE/BANDAGES/DRESSINGS) ×9 IMPLANT
CANISTER SUCT 1200ML W/VALVE (MISCELLANEOUS) ×3 IMPLANT
CHLORAPREP W/TINT 26ML (MISCELLANEOUS) ×3 IMPLANT
DRAPE C-ARM XRAY 36X54 (DRAPES) ×3 IMPLANT
DRAPE INCISE IOBAN 66X60 STRL (DRAPES) ×3 IMPLANT
DRAPE POUCH INSTRU U-SHP 10X18 (DRAPES) ×3 IMPLANT
DRAPE SHEET LG 3/4 BI-LAMINATE (DRAPES) ×9 IMPLANT
DRAPE TABLE BACK 80X90 (DRAPES) ×3 IMPLANT
DRESSING SURGICEL FIBRLLR 1X2 (HEMOSTASIS) ×2 IMPLANT
DRSG OPSITE POSTOP 4X8 (GAUZE/BANDAGES/DRESSINGS) ×3 IMPLANT
DRSG SURGICEL FIBRILLAR 1X2 (HEMOSTASIS) ×6
ELECT BLADE 6.5 EXT (BLADE) ×3 IMPLANT
ELECT REM PT RETURN 9FT ADLT (ELECTROSURGICAL) ×3
ELECTRODE REM PT RTRN 9FT ADLT (ELECTROSURGICAL) ×1 IMPLANT
GLOVE BIOGEL PI IND STRL 9 (GLOVE) ×1 IMPLANT
GLOVE BIOGEL PI INDICATOR 9 (GLOVE) ×2
GLOVE SURG SYN 9.0  PF PI (GLOVE) ×4
GLOVE SURG SYN 9.0 PF PI (GLOVE) ×2 IMPLANT
GOWN SRG 2XL LVL 4 RGLN SLV (GOWNS) ×1 IMPLANT
GOWN STRL NON-REIN 2XL LVL4 (GOWNS) ×2
GOWN STRL REUS W/ TWL LRG LVL3 (GOWN DISPOSABLE) ×1 IMPLANT
GOWN STRL REUS W/TWL LRG LVL3 (GOWN DISPOSABLE) ×2
HEAD FEMORAL 28MM SZ S (Head) ×3 IMPLANT
HEMOVAC 400CC 10FR (MISCELLANEOUS) ×3 IMPLANT
HOLDER FOLEY CATH W/STRAP (MISCELLANEOUS) ×3 IMPLANT
HOOD PEEL AWAY FLYTE STAYCOOL (MISCELLANEOUS) ×3 IMPLANT
KIT PREVENA INCISION MGT 13 (CANNISTER) ×3 IMPLANT
LINER DBL MOB SZ 0 52MM (Liner) ×3 IMPLANT
MAT BLUE FLOOR 46X72 FLO (MISCELLANEOUS) ×3 IMPLANT
NDL SAFETY ECLIPSE 18X1.5 (NEEDLE) ×1 IMPLANT
NEEDLE HYPO 18GX1.5 SHARP (NEEDLE) ×2
NEEDLE SPNL 18GX3.5 QUINCKE PK (NEEDLE) ×3 IMPLANT
NS IRRIG 1000ML POUR BTL (IV SOLUTION) ×3 IMPLANT
PACK HIP COMPR (MISCELLANEOUS) ×3 IMPLANT
SHELL ACETABULAR SZ 52 DM (Shell) ×3 IMPLANT
SOL PREP PVP 2OZ (MISCELLANEOUS) ×3
SOLUTION PREP PVP 2OZ (MISCELLANEOUS) ×1 IMPLANT
SPONGE DRAIN TRACH 4X4 STRL 2S (GAUZE/BANDAGES/DRESSINGS) IMPLANT
STAPLER SKIN PROX 35W (STAPLE) ×3 IMPLANT
STEM FEMORAL 1 STD COLLARED (Stem) ×3 IMPLANT
STRAP SAFETY 5IN WIDE (MISCELLANEOUS) ×3 IMPLANT
SUT DVC 2 QUILL PDO  T11 36X36 (SUTURE) ×2
SUT DVC 2 QUILL PDO T11 36X36 (SUTURE) ×1 IMPLANT
SUT SILK 0 (SUTURE) ×2
SUT SILK 0 30XBRD TIE 6 (SUTURE) ×1 IMPLANT
SUT V-LOC 90 ABS DVC 3-0 CL (SUTURE) ×3 IMPLANT
SUT VIC AB 1 CT1 36 (SUTURE) ×3 IMPLANT
SYR 20CC LL (SYRINGE) ×3 IMPLANT
SYR 30ML LL (SYRINGE) ×3 IMPLANT
SYR BULB IRRIG 60ML STRL (SYRINGE) ×3 IMPLANT
TAPE MICROFOAM 4IN (TAPE) ×3 IMPLANT
TOWEL OR 17X26 4PK STRL BLUE (TOWEL DISPOSABLE) ×3 IMPLANT
TRAY FOLEY MTR SLVR 16FR STAT (SET/KITS/TRAYS/PACK) ×3 IMPLANT
WND VAC CANISTER 500ML (MISCELLANEOUS) ×3 IMPLANT

## 2017-07-01 NOTE — Care Management Note (Signed)
Case Management Note  Patient Details  Name: BARRETT HOLTHAUS MRN: [id number] Date of Birth: 03/28/1960  Subjective/Objective:  Met with patient and her spouse at bedside. Patient sitting up in chair. Offered choice of home health agencies. They prefer [facility]. They used them in the past. Referral to [facility] for HHPT. She needs no DME, she has a walker and bsc from last admission. [facility]acy: [facility] [phone]) [phone]. Called Lovenox 40 mg # 14 no refills.                    Action/Plan: [facility] for HHPT, No DME, Lovenox called in.   Expected Discharge Date:  07/03/17               Expected Discharge Plan:  [place]  In-House Referral:     Discharge planning Services  CM Consult  Post Acute Care Choice:  Home Health Choice offered to:  Spouse  DME Arranged:    DME Agency:     HH Arranged:  PT [place]:  [facility] (formerly [employer])  Status of Service:  In process, will continue to follow  If discussed at Long Length of Stay Meetings, dates discussed:    Additional Comments:  Jolly Mango, RN 07/01/2017, 3:27 PM

## 2017-07-01 NOTE — NC FL2 (Signed)
[place] LEVEL OF CARE SCREENING TOOL     IDENTIFICATION  Patient Name: Claire Hansen Birthdate: 08-11-1960 Sex: female Admission Date (Current Location): 07/01/2017  [place] and [place] Number:  Engineering geologist and Address:  [facility], [address] [place], [place] [address]      Provider Number: [id number]  Attending Physician Name and Address:  Hessie Knows, MD  Relative Name and Phone Number:       Current Level of Care: Hospital Recommended Level of Care: [place] Prior Approval Number:    Date Approved/Denied:   PASRR Number: [id number] )  Discharge Plan: SNF    Current Diagnoses: Patient Active Problem List   Diagnosis Date Noted  . Primary localized osteoarthritis of left hip 07/01/2017  . Primary localized osteoarthritis of right hip 11/05/2016  . Hyperlipidemia 08/13/2014  . History of asthma 08/13/2014  . Resistant hypertension 08/13/2014  . Microalbuminuria 08/13/2014  . Morbid obesity with BMI of 45.0-49.9, adult ([place]) 08/13/2014  . Osteoarthritis of right knee 08/13/2014  . Allergic rhinitis, seasonal 08/13/2014  . Diabetes mellitus with renal manifestation ([place]) 11/06/2010    Orientation RESPIRATION BLADDER Height & Weight     Self, Time, Situation, Place  O2(1 Lites Oxygen. ) Continent Weight: 287 lb 7 oz (130.4 kg) Height:     BEHAVIORAL SYMPTOMS/MOOD NEUROLOGICAL BOWEL NUTRITION STATUS      Continent Diet(Diet: Carb Modified. )  AMBULATORY STATUS COMMUNICATION OF NEEDS Skin   Extensive Assist Verbally Surgical wounds(Incision: Left Hip. )                       Personal Care Assistance Level of Assistance  Bathing, Feeding, Dressing Bathing Assistance: Limited assistance Feeding assistance: Independent Dressing Assistance: Limited assistance     Functional Limitations Info  Sight, Hearing, Speech Sight Info: Adequate Hearing Info: Adequate Speech Info: Adequate     SPECIAL CARE FACTORS FREQUENCY  PT (By licensed PT), OT (By licensed OT)     PT Frequency: (5) OT Frequency: (5)            Contractures      Additional Factors Info  Code Status, Allergies Code Status Info: (Full Code. ) Allergies Info: (Shellfish Allergy)           Current Medications (07/01/2017):  This is the current hospital active medication list Current Facility-Administered Medications  Medication Dose Route Frequency Provider Last Rate Last Dose  . 0.9 %  sodium chloride infusion   Intravenous Continuous Hessie Knows, MD      . acetaminophen (TYLENOL) tablet 1,000 mg  1,000 mg Oral Q6H Hessie Knows, MD      . Derrill Memo ON 07/02/2017] acetaminophen (TYLENOL) tablet 325-650 mg  325-650 mg Oral Q6H PRN Hessie Knows, MD      . alum & mag hydroxide-simeth (MAALOX/MYLANTA) 200-200-20 MG/5ML suspension 30 mL  30 mL Oral Q4H PRN Hessie Knows, MD      . bisacodyl (DULCOLAX) suppository 10 mg  10 mg Rectal Daily PRN Hessie Knows, MD      . ceFAZolin (ANCEF) 3 g in dextrose 5 % 50 mL IVPB  3 g Intravenous Q6H Hessie Knows, MD      . diphenhydrAMINE (BENADRYL) 12.5 MG/5ML elixir 12.5-25 mg  12.5-25 mg Oral Q4H PRN Hessie Knows, MD      . docusate sodium (COLACE) capsule 100 mg  100 mg Oral BID Hessie Knows, MD      . Derrill Memo  ON 07/02/2017] enoxaparin (LOVENOX) injection 40 mg  40 mg Subcutaneous Q24H Hessie Knows, MD      . hydrALAZINE (APRESOLINE) tablet 10 mg  10 mg Oral TID Hessie Knows, MD      . HYDROmorphone (DILAUDID) injection 0.5-1 mg  0.5-1 mg Intravenous Q4H PRN Hessie Knows, MD      . labetalol (NORMODYNE,TRANDATE) 5 MG/ML injection           . loratadine (CLARITIN) tablet 10 mg  10 mg Oral Daily PRN Hessie Knows, MD      . magnesium citrate solution 1 Bottle  1 Bottle Oral Once PRN Hessie Knows, MD      . menthol-cetylpyridinium (CEPACOL) lozenge 3 mg  1 lozenge Oral PRN Hessie Knows, MD       Or  . phenol (CHLORASEPTIC) mouth spray 1 spray  1 spray  Mouth/Throat PRN Hessie Knows, MD      . metFORMIN (GLUCOPHAGE-XR) 24 hr tablet 500 mg  500 mg Oral Q breakfast Hessie Knows, MD      . methocarbamol (ROBAXIN) tablet 500 mg  500 mg Oral Q6H PRN Hessie Knows, MD       Or  . methocarbamol (ROBAXIN) 500 mg in dextrose 5 % 50 mL IVPB  500 mg Intravenous Q6H PRN Hessie Knows, MD      . metoCLOPramide (REGLAN) tablet 5-10 mg  5-10 mg Oral Q8H PRN Hessie Knows, MD       Or  . metoCLOPramide (REGLAN) injection 5-10 mg  5-10 mg Intravenous Q8H PRN Hessie Knows, MD      . metoprolol succinate (TOPROL-XL) 24 hr tablet 25 mg  25 mg Oral BID Hessie Knows, MD      . Derrill Memo ON 07/02/2017] montelukast (SINGULAIR) tablet 10 mg  10 mg Oral Daily PRN Hessie Knows, MD      . Olmesartan-amLODIPine-HCTZ 40-10-25 MG TABS 1 tablet  1 tablet Oral Daily Hessie Knows, MD      . ondansetron [facility]) tablet 4 mg  4 mg Oral Q6H PRN Hessie Knows, MD       Or  . ondansetron [facility]) injection 4 mg  4 mg Intravenous Q6H PRN Hessie Knows, MD      . oxyCODONE (Oxy IR/ROXICODONE) immediate release tablet 10-15 mg  10-15 mg Oral Q4H PRN Hessie Knows, MD      . oxyCODONE (Oxy IR/ROXICODONE) immediate release tablet 5-10 mg  5-10 mg Oral Q4H PRN Hessie Knows, MD      . senna-docusate (Senokot-S) tablet 1 tablet  1 tablet Oral QHS PRN Hessie Knows, MD      . spironolactone (ALDACTONE) tablet 25 mg  25 mg Oral Daily Hessie Knows, MD      . traMADol Veat[facility]) tablet 50 mg  50 mg Oral Q6H Hessie Knows, MD      . zolpidem (AMBIEN) tablet 5 mg  5 mg Oral QHS PRN Hessie Knows, MD         Discharge Medications: Please see discharge summary for a list of discharge medications.  Relevant Imaging Results:  Relevant Lab Results:   Additional Information (SSN: [id number])  Lindee Leason, Veronia Beets, LCSW

## 2017-07-01 NOTE — Evaluation (Signed)
Physical Therapy Evaluation Patient Details Name: Claire Hansen MRN: [id number] DOB: 05/16/1960 Today's Date: 07/01/2017   History of Present Illness  57 y/o female s/p L total hip (anterior approach) 07/01/17, had R hip replaced 10/18,  Clinical Impression  Pt did well with PT exam and was able to participate with ~15 minutes of supine exercises apart from the PT exam as well as ~10 minutes of additional gait training.  Pt is PWBing (50%) and did need clarification and reinforcement on this before, during and after ambulation.  She c/o considerable pain at times but appeared to deal with it well and was able to do all that was asked of her with good effort and performance.     Follow Up Recommendations Home health PT    Equipment Recommendations  None recommended by PT    Recommendations for Other Services       Precautions / Restrictions Precautions Precautions: Anterior Hip;Fall Restrictions Weight Bearing Restrictions: Yes LLE Weight Bearing: Partial weight bearing LLE Partial Weight Bearing Percentage or Pounds: 50      Mobility  Bed Mobility Overal bed mobility: Modified Independent             General bed mobility comments: Pt struggled with getting to upright and relied heavily on the rail but ultimately got to sitting EOB w/o physical assist  Transfers Overall transfer level: Independent Equipment used: Rolling walker (2 wheeled)             General transfer comment: Pt is able to rise to standing w/o direct assist, showed good effort and safety  Ambulation/Gait Ambulation/Gait assistance: Min guard Ambulation Distance (Feet): 40 Feet Assistive device: Rolling walker (2 wheeled)       General Gait Details: Pt did well with ambulation.  She was able to maintain consistent cadence and actually needed cues to slow down and insure she maintained PWBing  Stairs            Wheelchair Mobility    Modified Rankin (Stroke Patients Only)        Balance Overall balance assessment: Modified Independent                                           Pertinent Vitals/Pain Pain Assessment: 0-10 Pain Score: (inconsistent with reporting, at times "not bad" at times 10)    Home Living Family/patient expects to be discharged to:: Private residence Living Arrangements: Spouse/significant other;Children Available Help at Discharge: Family Type of Home: Apartment Home Access: Stairs to enter Entrance Stairs-Rails: Right;Left;Can reach both Technical brewer of Steps: 12 x2 (landing between flights, can place chair as needed) Home Layout: One [place]: [place] - 2 wheels;Cane - single point      Prior Function Level of Independence: Independent with assistive device(s)         Comments: Mod indep with ADLs, household and community mobility; intermittent use of SPC vs RW as needed due to R LE pain.  Denies fall history.     Hand Dominance        Extremity/Trunk Assessment   Upper Extremity Assessment Upper Extremity Assessment: Overall WFL for tasks assessed;Generalized weakness    Lower Extremity Assessment Lower Extremity Assessment: Generalized weakness;Overall WFL for tasks assessed       Communication   Communication: No difficulties  Cognition Arousal/Alertness: Lethargic Behavior During Therapy: WFL for tasks assessed/performed Overall Cognitive Status:  Within Functional Limits for tasks assessed                                        General Comments      Exercises Total Joint Exercises Ankle Circles/Pumps: Strengthening;10 reps Quad Sets: Strengthening;10 reps Gluteal Sets: Strengthening;10 reps Short Arc Quad: AROM;Strengthening;10 reps Heel Slides: AROM;10 reps Hip ABduction/ADduction: Strengthening;10 reps Straight Leg Raises: AAROM;5 reps(pt able to do 1 rep w/o assist)   Assessment/Plan    PT Assessment Patient needs continued PT services   PT Problem List Decreased strength;Decreased range of motion;Decreased activity tolerance;Decreased balance;Decreased coordination;Decreased mobility;Decreased cognition;Decreased knowledge of use of DME;Decreased knowledge of precautions       PT Treatment Interventions DME instruction;Gait training;Stair training;Functional mobility training;Therapeutic activities;Therapeutic exercise;Balance training;Patient/family education;Neuromuscular re-education    PT Goals (Current goals can be found in the Care Plan section)  Acute Rehab PT Goals Patient Stated Goal: go home PT Goal Formulation: With patient Time For Goal Achievement: 07/15/17 Potential to Achieve Goals: Good    Frequency BID   Barriers to discharge        Co-evaluation               AM-PAC PT "6 Clicks" Daily Activity  Outcome Measure Difficulty turning over in bed (including adjusting bedclothes, sheets and blankets)?: A Little Difficulty moving from lying on back to sitting on the side of the bed? : A Lot Difficulty sitting down on and standing up from a chair with arms (e.g., wheelchair, bedside commode, etc,.)?: A Little Help needed moving to and from a bed to chair (including a wheelchair)?: None Help needed walking in hospital room?: None Help needed climbing 3-5 steps with a railing? : A Lot 6 Click Score: 18    End of Session Equipment Utilized During Treatment: Gait belt Activity Tolerance: Patient tolerated treatment well Patient left: in chair;with call bell/phone within reach;with family/visitor present Nurse Communication: Mobility status PT Visit Diagnosis: Muscle weakness (generalized) (M62.81);Difficulty in walking, not elsewhere classified (R26.2)    Time: 1415-1500 PT Time Calculation (min) (ACUTE ONLY): 45 min   Charges:   PT Evaluation $PT Eval Low Complexity: 1 Low PT Treatments $Gait Training: 8-22 mins $Therapeutic Exercise: 8-22 mins   PT G Codes:        Kreg Shropshire,  DPT 07/01/2017, 3:54 PM

## 2017-07-01 NOTE — Op Note (Signed)
07/01/2017  10:00 AM  PATIENT:  Claire Hansen  57 y.o. female  PRE-OPERATIVE DIAGNOSIS:  PRIMARY LOCALIZED OSTEOARTHRITIS OF LEFT HIP  POST-OPERATIVE DIAGNOSIS:  PRIMARY LOCALIZED OSTEOARTHRITIS OF LEFT HIP  PROCEDURE:  Procedure(s): TOTAL HIP ARTHROPLASTY ANTERIOR APPROACH (Left)  SURGEON: Laurene Footman, MD  ASSISTANTS: None  ANESTHESIA:   spinal  EBL:  Total I/O In: 1200 [I.V.:1200] Out: -   BLOOD ADMINISTERED:none  DRAINS: (2) Hemovact drain(s) in the subcut layer with  Suction Open   LOCAL MEDICATIONS USED:  MARCAINE     SPECIMEN:  Source of Specimen:  Left femoral head  DISPOSITION OF SPECIMEN:  PATHOLOGY  COUNTS:  YES  TOURNIQUET:  * No tourniquets in log *  IMPLANTS: Medacta AMIS 1 standard stem with 52 mm Mpact TM with liner and S 28 mm ceramic head  DICTATION: .Dragon Dictation   The patient was brought to the operating room and after spinal anesthesia was obtained patient was placed on the operative table with the ipsilateral foot into the Medacta attachment, contralateral leg on a well-padded table. C-arm was brought in and preop template x-ray taken. After prepping and draping in usual sterile fashion appropriate patient identification and timeout procedures were completed. Anterior approach to the hip was obtained and centered over the greater trochanter and TFL muscle. The subcutaneous tissue was incised hemostasis being achieved by electrocautery. TFL fascia was incised and the muscle retracted laterally deep retractor placed. The lateral femoral circumflex vessels were identified and ligated. The anterior capsule was exposed and a capsulotomy performed. The neck was identified and a femoral neck cut carried out with a saw. The head was removed without difficulty and showed sclerotic femoral head and acetabulum. Reaming was carried out to 50 mm and a 52 mm cup trial gave appropriate tightness to the acetabular component a 52 DM cup was impacted into position.  The leg was then externally rotated and ischiofemoral and patellofemoral releases carried out. The femur was sequentially broached to a size 1, size 1 femoral  standard head trials were placed and the final components chosen. The one standard stem was inserted along with a ceramic S 28 mm head and 52 mm liner. The hip was reduced and was stable the wound was thoroughly irrigated with fibrillar placed on the posterior capsule and medial neck n. The deep fascia was closed using  a heavy Quill after infiltration of 30 cc of quarter percent Sensorcaine with epinephrine. Subcutaneous drains were then inserted to a Quill to close the skin with skin staples and incisional wound VAC applied  PLAN OF CARE: Admit to inpatient

## 2017-07-01 NOTE — Progress Notes (Signed)
Anticoagulation monitoring(Lovenox):  57yo  F ordered Lovenox 40 mg Q24h  Filed Weights   07/01/17 0609  Weight: 287 lb 7 oz (130.4 kg)   BMI 47.83    Lab Results  Component Value Date   CREATININE 0.95 06/23/2017   CREATININE 1.17 (H) 11/07/2016   CREATININE 0.86 11/06/2016   Estimated Creatinine Clearance: 91.3 mL/min (by C-G formula based on SCr of 0.95 mg/dL). Hemoglobin & Hematocrit     Component Value Date/Time   HGB 14.8 06/23/2017 1349   HCT 43.2 06/23/2017 1349     Per Protocol for Patient with estCrcl > 30 ml/min and BMI > 40, will transition to Lovenox 40 mg Q12h.     Chinita Greenland PharmD Clinical Pharmacist 07/01/2017

## 2017-07-01 NOTE — Anesthesia Procedure Notes (Signed)
Date/Time: 07/01/2017 8:00 AM Performed by: Nelda Marseille, CRNA Pre-anesthesia Checklist: Patient identified, Emergency Drugs available, Suction available, Patient being monitored and Timeout performed Oxygen Delivery Method: Simple face mask

## 2017-07-01 NOTE — H&P (Signed)
Reviewed paper H+P, will be scanned into chart. No changes noted.  

## 2017-07-01 NOTE — Anesthesia Post-op Follow-up Note (Signed)
Anesthesia QCDR form completed.        

## 2017-07-01 NOTE — Anesthesia Procedure Notes (Signed)
Spinal  Patient location during procedure: OR Start time: 07/01/2017 7:45 AM End time: 07/01/2017 7:58 AM Staffing Resident/CRNA: Rolla Plate, CRNA Performed: resident/CRNA  Preanesthetic Checklist Completed: patient identified, site marked, surgical consent, pre-op evaluation, timeout performed, IV checked, risks and benefits discussed and monitors and equipment checked Spinal Block Patient position: sitting Prep: Betadine Patient monitoring: heart rate, continuous pulse ox, blood pressure and cardiac monitor Approach: midline Location: L3-4 Injection technique: single-shot Needle Needle type: Whitacre and Introducer  Needle gauge: 24 G Needle length: 9 cm Additional Notes Negative paresthesia. Negative blood return. Positive free-flowing CSF. Expiration date of kit checked and confirmed. Patient tolerated procedure well, without complications.

## 2017-07-01 NOTE — Transfer of Care (Signed)
Immediate Anesthesia Transfer of Care Note  Patient: Claire Hansen  Procedure(s) Performed: TOTAL HIP ARTHROPLASTY ANTERIOR APPROACH (Left )  Patient Location: PACU  Anesthesia Type:Spinal  Level of Consciousness: awake, alert  and oriented  Airway & Oxygen Therapy: Patient Spontanous Breathing and Patient connected to face mask oxygen  Post-op Assessment: Report given to RN and Post -op Vital signs reviewed and stable  Post vital signs: Reviewed and stable  Last Vitals:  Vitals Value Taken Time  BP    Temp    Pulse 78 07/01/2017  9:59 AM  Resp    SpO2 99 % 07/01/2017  9:59 AM  Vitals shown include unvalidated device data.  Last Pain:  Vitals:   07/01/17 0609  TempSrc: Oral         Complications: No apparent anesthesia complications

## 2017-07-01 NOTE — Progress Notes (Signed)
Patient admitted to room 134. Patient is alert and oriented x 4. Patient and family educated about safety and call light system. They verbalized understanding. Bed to lowest position and call light within reach.

## 2017-07-01 NOTE — Anesthesia Preprocedure Evaluation (Addendum)
Anesthesia Evaluation  Patient identified by MRN, date of birth, ID band Patient awake    Reviewed: Allergy & Precautions, H&P , NPO status , reviewed documented beta blocker date and time   Airway Mallampati: IV  TM Distance: >3 FB Neck ROM: full    Dental  (+) Missing, Caps, Chipped   Pulmonary asthma , pneumonia, resolved,    Pulmonary exam normal        Cardiovascular hypertension, Normal cardiovascular exam     Neuro/Psych PSYCHIATRIC DISORDERS Anxiety    GI/Hepatic   Endo/Other  diabetes  Renal/GU Renal disease     Musculoskeletal  (+) Arthritis ,   Abdominal   Peds  Hematology   Anesthesia Other Findings Past Medical History: No date: Allergy No date: Anxiety No date: Asthma     Comment:  pt denies having asthma No date: Chronic osteoarthritis     Comment:  knees No date: Deviated septum No date: Diabetes mellitus without complication (HCC) No date: Hyperlipidemia No date: Hypertension No date: Irregular menstrual cycle No date: Lymphadenopathy No date: Obesity No date: Peri-menopause 2015: Pneumonia  Past Surgical History: No date: BREAST SURGERY; Right     Comment:  Cyst removed No date: CESAREAN SECTION     Comment:  x2 No date: CHOLECYSTECTOMY No date: DILATION AND CURETTAGE OF UTERUS     Comment:  Treatment of Metorrhagia and Endometrial Ablation No date: TONSILLECTOMY 11/05/2016: TOTAL HIP ARTHROPLASTY; Right     Comment:  Procedure: TOTAL HIP ARTHROPLASTY ANTERIOR APPROACH;                Surgeon: Hessie Knows, MD;  Location: [facility] ORS;                Service: Orthopedics;  Laterality: Right;  BMI    Body Mass Index:  47.83 kg/m      Reproductive/Obstetrics                            Anesthesia Physical Anesthesia Plan  ASA: III  Anesthesia Plan: Spinal   Post-op Pain Management:    Induction:   PONV Risk Score and Plan: 2 and Treatment may vary  due to age or medical condition, Ondansetron, Midazolam and Dexamethasone  Airway Management Planned:   Additional Equipment:   Intra-op Plan:   Post-operative Plan:   Informed Consent: I have reviewed the patients History and Physical, chart, labs and discussed the procedure including the risks, benefits and alternatives for the proposed anesthesia with the patient or authorized representative who has indicated his/her understanding and acceptance.   Dental Advisory Given  Plan Discussed with: CRNA  Anesthesia Plan Comments:         Anesthesia Quick Evaluation

## 2017-07-02 ENCOUNTER — Encounter: Payer: Self-pay | Admitting: Orthopedic Surgery

## 2017-07-02 LAB — BASIC METABOLIC PANEL
Anion gap: 12 (ref 5–15)
BUN: 15 mg/dL (ref 6–20)
CALCIUM: 8.8 mg/dL — AB (ref 8.9–10.3)
CO2: 22 mmol/L (ref 22–32)
CREATININE: 1.35 mg/dL — AB (ref 0.44–1.00)
Chloride: 99 mmol/L — ABNORMAL LOW (ref 101–111)
GFR calc non Af Amer: 43 mL/min — ABNORMAL LOW (ref >60)
GFR, EST AFRICAN AMERICAN: 50 mL/min — AB (ref >60)
Glucose, Bld: 273 mg/dL — ABNORMAL HIGH (ref 65–99)
Potassium: 4 mmol/L (ref 3.5–5.1)
SODIUM: 133 mmol/L — AB (ref 135–145)

## 2017-07-02 LAB — GLUCOSE, CAPILLARY
GLUCOSE-CAPILLARY: 265 mg/dL — AB (ref 65–99)
Glucose-Capillary: 282 mg/dL — ABNORMAL HIGH (ref 65–99)
Glucose-Capillary: 228 mg/dL — ABNORMAL HIGH (ref 65–99)
Glucose-Capillary: 248 mg/dL — ABNORMAL HIGH (ref 65–99)
Glucose-Capillary: 250 mg/dL — ABNORMAL HIGH (ref 65–99)

## 2017-07-02 LAB — CBC
HCT: 33.2 % — ABNORMAL LOW (ref 35.0–47.0)
Hemoglobin: 11.5 g/dL — ABNORMAL LOW (ref 12.0–16.0)
MCH: 30.4 pg (ref 26.0–34.0)
MCHC: 34.7 g/dL (ref 32.0–36.0)
MCV: 87.6 fL (ref 80.0–100.0)
PLATELETS: 281 10*3/uL (ref 150–440)
RBC: 3.79 MIL/uL — ABNORMAL LOW (ref 3.80–5.20)
RDW: 15.5 % — AB (ref 11.5–14.5)
WBC: 9.8 10*3/uL (ref 3.6–11.0)

## 2017-07-02 MED ORDER — INSULIN ASPART 100 UNIT/ML ~~LOC~~ SOLN
0.0000 [IU] | Freq: Three times a day (TID) | SUBCUTANEOUS | Status: DC
  Administered 2017-07-02: 5 [IU] via SUBCUTANEOUS
  Administered 2017-07-02: 8 [IU] via SUBCUTANEOUS
  Administered 2017-07-02: 5 [IU] via SUBCUTANEOUS
  Administered 2017-07-03: 8 [IU] via SUBCUTANEOUS
  Filled 2017-07-02 (×4): qty 1

## 2017-07-02 NOTE — Discharge Instructions (Signed)
ANTERIOR APPROACH TOTAL HIP REPLACEMENT POSTOPERATIVE DIRECTIONS ° ° °Hip Rehabilitation, Guidelines Following Surgery  °The results of a hip operation are greatly improved after range of motion and muscle strengthening exercises. Follow all safety measures which are given to protect your hip. If any of these exercises cause increased pain or swelling in your joint, decrease the amount until you are comfortable again. Then slowly increase the exercises. Call your caregiver if you have problems or questions.  ° °HOME CARE INSTRUCTIONS  °Remove items at home which could result in a fall. This includes throw rugs or furniture in walking pathways.  °· ICE to the affected hip every three hours for 30 minutes at a time and then as needed for pain and swelling.  Continue to use ice on the hip for pain and swelling from surgery. You may notice swelling that will progress down to the foot and ankle.  This is normal after surgery.  Elevate the leg when you are not up walking on it.   °· Continue to use the breathing machine which will help keep your temperature down.  It is common for your temperature to cycle up and down following surgery, especially at night when you are not up moving around and exerting yourself.  The breathing machine keeps your lungs expanded and your temperature down. °· Do not place pillow under knee, focus on keeping the knee straight while resting ° °DIET °You may resume your previous home diet once your are discharged from the hospital. ° °DRESSING / WOUND CARE / SHOWERING °Keep the surgical dressing until follow up.  The dressing is water proof, so you can shower without any extra covering.  IF THE DRESSING FALLS OFF or the wound gets wet inside, change the dressing with sterile gauze.  Please use good hand washing techniques before changing the dressing.  Do not use any lotions or creams on the incision until instructed by your surgeon.   °Keep your dressing dry with showering.  You can keep it  covered and pat dry. °Change the surgical dressing daily and reapply a dry dressing each time. ° °ACTIVITY °Walk with your walker as instructed. °Use walker as long as suggested by your caregivers. °Avoid periods of inactivity such as sitting longer than an hour when not asleep. This helps prevent blood clots.  °You may resume a sexual relationship in one month or when given the OK by your doctor.  °You may return to work once you are cleared by your doctor.  °Do not drive a car for 6 weeks or until released by you surgeon.  °Do not drive while taking narcotics. ° °WEIGHT BEARING °Weight bearing as tolerated with assist device (walker, cane, etc) as directed, use it as long as suggested by your surgeon or therapist, typically at least 4-6 weeks. ° °POSTOPERATIVE CONSTIPATION PROTOCOL °Constipation - defined medically as fewer than three stools per week and severe constipation as less than one stool per week. ° °One of the most common issues patients have following surgery is constipation.  Even if you have a regular bowel pattern at home, your normal regimen is likely to be disrupted due to multiple reasons following surgery.  Combination of anesthesia, postoperative narcotics, change in appetite and fluid intake all can affect your bowels.  In order to avoid complications following surgery, here are some recommendations in order to help you during your recovery period. ° °Colace (docusate) - Pick up an over-the-counter form of Colace or another stool softener and take twice   a day as long as you are requiring postoperative pain medications.  Take with a full glass of water daily.  If you experience loose stools or diarrhea, hold the colace until you stool forms back up.  If your symptoms do not get better within 1 week or if they get worse, check with your doctor. ° °Dulcolax (bisacodyl) - Pick up over-the-counter and take as directed by the product packaging as needed to assist with the movement of your bowels.   Take with a full glass of water.  Use this product as needed if not relieved by Colace only.  ° °MiraLax (polyethylene glycol) - Pick up over-the-counter to have on hand.  MiraLax is a solution that will increase the amount of water in your bowels to assist with bowel movements.  Take as directed and can mix with a glass of water, juice, soda, coffee, or tea.  Take if you go more than two days without a movement. °Do not use MiraLax more than once per day. Call your doctor if you are still constipated or irregular after using this medication for 7 days in a row. ° °If you continue to have problems with postoperative constipation, please contact the office for further assistance and recommendations.  If you experience "the worst abdominal pain ever" or develop nausea or vomiting, please contact the office immediatly for further recommendations for treatment. ° °ITCHING ° If you experience itching with your medications, try taking only a single pain pill, or even half a pain pill at a time.  You can also use Benadryl over the counter for itching or also to help with sleep.  ° °TED HOSE STOCKINGS °Wear the elastic stockings on both legs for three weeks following surgery during the day but you may remove then at night for sleeping. ° °MEDICATIONS °See your medication summary on the “After Visit Summary” that the nursing staff will review with you prior to discharge.  You may have some home medications which will be placed on hold until you complete the course of blood thinner medication.  It is important for you to complete the blood thinner medication as prescribed by your surgeon.  Continue your approved medications as instructed at time of discharge. ° °PRECAUTIONS °If you experience chest pain or shortness of breath - call 911 immediately for transfer to the hospital emergency department.  °If you develop a fever greater that 101 F, purulent drainage from wound, increased redness or drainage from wound, foul odor  from the wound/dressing, or calf pain - CONTACT YOUR SURGEON.   °                                                °FOLLOW-UP APPOINTMENTS °Make sure you keep all of your appointments after your operation with your surgeon and caregivers. You should call the office at the above phone number and make an appointment for approximately two weeks after the date of your surgery or on the date instructed by your surgeon outlined in the "After Visit Summary". ° °RANGE OF MOTION AND STRENGTHENING EXERCISES  °These exercises are designed to help you keep full movement of your hip joint. Follow your caregiver's or physical therapist's instructions. Perform all exercises about fifteen times, three times per day or as directed. Exercise both hips, even if you have had only one joint replacement. These exercises can be   done on a training (exercise) mat, on the floor, on a table or on a bed. Use whatever works the best and is most comfortable for you. Use music or television while you are exercising so that the exercises are a pleasant break in your day. This will make your life better with the exercises acting as a break in routine you can look forward to.  °Lying on your back, slowly slide your foot toward your buttocks, raising your knee up off the floor. Then slowly slide your foot back down until your leg is straight again.  °Lying on your back spread your legs as far apart as you can without causing discomfort.  °Lying on your side, raise your upper leg and foot straight up from the floor as far as is comfortable. Slowly lower the leg and repeat.  °Lying on your back, tighten up the muscle in the front of your thigh (quadriceps muscles). You can do this by keeping your leg straight and trying to raise your heel off the floor. This helps strengthen the largest muscle supporting your knee.  °Lying on your back, tighten up the muscles of your buttocks both with the legs straight and with the knee bent at a comfortable angle while  keeping your heel on the floor.  ° °IF YOU ARE TRANSFERRED TO A SKILLED REHAB FACILITY °If the patient is transferred to a skilled rehab facility following release from the hospital, a list of the current medications will be sent to the facility for the patient to continue.  When discharged from the skilled rehab facility, please have the facility set up the patient's Home Health Physical Therapy prior to being released. Also, the skilled facility will be responsible for providing the patient with their medications at time of release from the facility to include their pain medication, the muscle relaxants, and their blood thinner medication. If the patient is still at the rehab facility at time of the two week follow up appointment, the skilled rehab facility will also need to assist the patient in arranging follow up appointment in our office and any transportation needs. ° °MAKE SURE YOU:  °Understand these instructions.  °Get help right away if you are not doing well or get worse.  ° ° °Pick up stool softner and laxative for home use following surgery while on pain medications. °Do not submerge incision under water. °Please use good hand washing techniques while changing dressing each day. °May shower starting three days after surgery. °Please use a clean towel to pat the incision dry following showers. °Continue to use ice for pain and swelling after surgery. °Do not use any lotions or creams on the incision until instructed by your surgeon. ° °

## 2017-07-02 NOTE — Progress Notes (Signed)
Clinical Social Worker (CSW) received SNF consult. PT is recommending home health. RN case manager aware of above. Please reconsult if future social work needs arise. CSW signing off.   Leighton Luster, LCSW [phone] 

## 2017-07-02 NOTE — Progress Notes (Signed)
Physical Therapy Treatment Patient Details Name: Claire Hansen MRN: [id number] DOB: Apr 13, 1960 Today's Date: 07/02/2017    History of Present Illness 57 y/o female s/p L total hip (anterior approach) 07/01/17, had R hip replaced 10/18,    PT Comments    Pt is able to ambulate confidently and with consistent cadence using walker.  She reports general soreness with most acts, but is eager to work with PT and overall did well with exercises, mobility, etc.  She was able to negotiate longer bout of steps this afternoon with no direct physical assist.  Family present t/o the session and eager to assist as they are able.  Pt should be able to return home with HHPT once medically stable.    Follow Up Recommendations  Home health PT     Equipment Recommendations  None recommended by PT    Recommendations for Other Services       Precautions / Restrictions Precautions Precautions: Anterior Hip;Fall Restrictions Weight Bearing Restrictions: Yes LLE Weight Bearing: Partial weight bearing    Mobility  Bed Mobility               General bed mobility comments: Pt in recliner on arrival, returned to recliner not tested  Transfers Overall transfer level: Independent Equipment used: Rolling walker (2 wheeled)             General transfer comment: good safety awareness  Ambulation/Gait Ambulation/Gait assistance: Supervision Ambulation Distance (Feet): 200 Feet Assistive device: Rolling walker (2 wheeled)       General Gait Details: Pt continues to walk with good confidence and appropriate cadence.  She again needed, at times, reminders to slow and insure that she is maintaining Mitchell on the L and using the walker appropriately. Pt fatigued with prolonged ambulation with HR up to 130 with the effort.    Stairs Stairs: Yes Stairs assistance: Supervision Stair Management: Two rails Number of Stairs: 12 General stair comments: Pt was able to negotiate up/down steps  safely and w/o direct physical assist.  Good overall effort.    Wheelchair Mobility    Modified Rankin (Stroke Patients Only)       Balance Overall balance assessment: Modified Independent                                          Cognition Arousal/Alertness: Awake/alert Behavior During Therapy: WFL for tasks assessed/performed Overall Cognitive Status: Within Functional Limits for tasks assessed                                        Exercises Total Joint Exercises Heel Slides: Strengthening;10 reps Hip ABduction/ADduction: Strengthening;10 reps Knee Flexion: Strengthening;10 reps Marching in Standing: Seated;Strengthening;10 reps    General Comments        Pertinent Vitals/Pain Pain Assessment: 0-10 Pain Score: 4     Home Living                      Prior Function            PT Goals (current goals can now be found in the care plan section) Acute Rehab PT Goals Patient Stated Goal: return to PLOF  Progress towards PT goals: Progressing toward goals    Frequency    BID  PT Plan Current plan remains appropriate    Co-evaluation              AM-PAC PT "6 Clicks" Daily Activity  Outcome Measure  Difficulty turning over in bed (including adjusting bedclothes, sheets and blankets)?: None Difficulty moving from lying on back to sitting on the side of the bed? : None Difficulty sitting down on and standing up from a chair with arms (e.g., wheelchair, bedside commode, etc,.)?: None Help needed moving to and from a bed to chair (including a wheelchair)?: None Help needed walking in hospital room?: None Help needed climbing 3-5 steps with a railing? : None 6 Click Score: 24    End of Session Equipment Utilized During Treatment: Gait belt Activity Tolerance: Patient tolerated treatment well Patient left: in chair;with call bell/phone within reach;with family/visitor present   PT Visit Diagnosis:  Muscle weakness (generalized) (M62.81);Difficulty in walking, not elsewhere classified (R26.2)     Time: 0940-7680 PT Time Calculation (min) (ACUTE ONLY): 26 min  Charges:  $Gait Training: 8-22 mins $Therapeutic Exercise: 8-22 mins                    G Codes:       Kreg Shropshire, DPT 07/02/2017, 3:07 PM

## 2017-07-02 NOTE — Care Management (Signed)
Cost of Lovenox is $93.20

## 2017-07-02 NOTE — Progress Notes (Signed)
Physical Therapy Treatment Patient Details Name: Claire Hansen MRN: [id number] DOB: Oct 27, 1960 Today's Date: 07/02/2017    History of Present Illness 57 y/o female s/p L total hip (anterior approach) 07/01/17, had R hip replaced 10/18,    PT Comments    Pt continues with good PT progress.  She was not pain limited and did well with >100 ft of ambulation, good safety/confidence with 4 steps and showed excellent strength, quality of motion and effort with exercises.  Pt overall at or beyond typical POD1 expectations and should be able to safely return home once medically stable.   Follow Up Recommendations  Home health PT     Equipment Recommendations  None recommended by PT    Recommendations for Other Services       Precautions / Restrictions Precautions Precautions: Anterior Hip;Fall Restrictions LLE Weight Bearing: Partial weight bearing LLE Partial Weight Bearing Percentage or Pounds: 50    Mobility  Bed Mobility Overal bed mobility: Modified Independent             General bed mobility comments: Pt in recliner on arrival, not tested  Transfers Overall transfer level: Independent Equipment used: Rolling walker (2 wheeled)             General transfer comment: Pt is able to rise to standing with no hesitation and good safety  Ambulation/Gait Ambulation/Gait assistance: Supervision Ambulation Distance (Feet): 125 Feet Assistive device: Rolling walker (2 wheeled)       General Gait Details: Pt again walking with good confidence and able to maintain consistent cadence and good speed.  Some cuing needed to insure she is maintaining PWBing and using UEs on walker enough.     Stairs Stairs: Yes Stairs assistance: Supervision Stair Management: Two rails Number of Stairs: 4 General stair comments: Pt was able to negotiate up/down steps safely and w/o direct physical assist.  Good overall effort.    Wheelchair Mobility    Modified Rankin (Stroke  Patients Only)       Balance Overall balance assessment: Modified Independent                                          Cognition Arousal/Alertness: Lethargic Behavior During Therapy: WFL for tasks assessed/performed Overall Cognitive Status: Within Functional Limits for tasks assessed                                        Exercises Total Joint Exercises Ankle Circles/Pumps: Strengthening;15 reps Quad Sets: Strengthening;15 reps Gluteal Sets: Strengthening;15 reps Heel Slides: Strengthening;10 reps Hip ABduction/ADduction: Strengthening;15 reps Straight Leg Raises: AAROM;10 reps Long Arc Quad: Strengthening;15 reps    General Comments        Pertinent Vitals/Pain Pain Assessment: 0-10 Pain Score: 4     Home Living Family/patient expects to be discharged to:: Private residence                    Prior Function            PT Goals (current goals can now be found in the care plan section) Progress towards PT goals: Progressing toward goals    Frequency    BID      PT Plan Current plan remains appropriate    Co-evaluation  AM-PAC PT "6 Clicks" Daily Activity  Outcome Measure  Difficulty turning over in bed (including adjusting bedclothes, sheets and blankets)?: None Difficulty moving from lying on back to sitting on the side of the bed? : A Little Difficulty sitting down on and standing up from a chair with arms (e.g., wheelchair, bedside commode, etc,.)?: None Help needed moving to and from a bed to chair (including a wheelchair)?: None Help needed walking in hospital room?: None Help needed climbing 3-5 steps with a railing? : None 6 Click Score: 23    End of Session Equipment Utilized During Treatment: Gait belt Activity Tolerance: Patient tolerated treatment well Patient left: in chair;with call bell/phone within reach;with family/visitor present Nurse Communication: Mobility status PT  Visit Diagnosis: Muscle weakness (generalized) (M62.81);Difficulty in walking, not elsewhere classified (R26.2)     Time: 8022-3361 PT Time Calculation (min) (ACUTE ONLY): 41 min  Charges:  $Gait Training: 8-22 mins $Therapeutic Exercise: 23-37 mins                    Kreg Shropshire, DPT 07/02/2017, 10:02 AM

## 2017-07-02 NOTE — Evaluation (Signed)
Occupational Therapy Evaluation Patient Details Name: Claire Hansen MRN: [id number] DOB: [birth year] Today's Date: 07/02/2017    History of Present Illness 57 y/o female s/p L total hip (anterior approach) 07/01/17, had R hip replaced 10/18,   Clinical Impression   Pt seen for OT evaluation this date, POD#1 from above surgery. Pt was independent in all ADLs prior to surgery, however using AD PRN for mobility due to L hip pain. Pt is eager to return to PLOF with less pain and improved safety and independence. Pt currently requires minimal assist for LB dressing and bathing while in seated position due to pain and limited AROM of L hip. Pt able to recall partial weight bearing precaution without cues. Pt/spouse instructed in functional mobility strategies within home environment, self care skills, falls prevention strategies, home/routines modifications, DME/AE for LB bathing and dressing tasks, compression stocking mgt strategies, and bladder mgt strategies to minimize falls risk and need to rush to the bathroom. Pt/spouse verbalized understanding of all education/training provided. Pt would benefit from additional instruction in self care skills and techniques to help maintain precautions with or without assistive devices to support recall and carryover prior to discharge while in the hospital. Do not anticipate skilled OT needs upon discharge from hospital.       Follow Up Recommendations  No OT follow up    Equipment Recommendations  Other (comment)(reacher)    Recommendations for Other Services       Precautions / Restrictions Precautions Precautions: Anterior Hip;Fall Restrictions Weight Bearing Restrictions: Yes LLE Weight Bearing: Partial weight bearing LLE Partial Weight Bearing Percentage or Pounds: 50      Mobility Bed Mobility              General bed mobility comments: Pt in recliner on arrival, not tested  Transfers Overall transfer level: Independent Equipment  used: Rolling walker (2 wheeled)             General transfer comment: good safety awareness    Balance Overall balance assessment: Modified Independent                                         ADL either performed or assessed with clinical judgement   ADL Overall ADL's : Needs assistance/impaired                                     Functional mobility during ADLs: Min guard;Rolling walker General ADL Comments: pt requires min assist for LB ADL tasks which spouse is able to provide independently. Instructed in AE for LB ADL, compression stocking mgt, and falls prevention strategies.     Vision Patient Visual Report: No change from baseline       Perception     Praxis      Pertinent Vitals/Pain Pain Assessment: 0-10 Pain Score: 5  Pain Location: L hip Pain Descriptors / Indicators: Aching Pain Intervention(s): Ice applied;Limited activity within patient's tolerance;Monitored during session;Premedicated before session     Hand Dominance Right   Extremity/Trunk Assessment Upper Extremity Assessment Upper Extremity Assessment: Overall WFL for tasks assessed;Generalized weakness(grossly 4/5 bilaterally)   Lower Extremity Assessment Lower Extremity Assessment: Overall WFL for tasks assessed;Generalized weakness;Defer to PT evaluation(anticipated post-op weakness/ROM deficits)   Cervical / Trunk Assessment Cervical / Trunk Assessment: Normal   Communication Communication Communication: No  difficulties   Cognition Arousal/Alertness: Awake/alert Behavior During Therapy: WFL for tasks assessed/performed Overall Cognitive Status: Within Functional Limits for tasks assessed                                     General Comments       Exercises Other Exercises Other Exercises: Pt/spouse educated in bladder mgt strategies, as pt gets up to use the bathroom several times per night (recommend BSC beside bed overnight) and pt  concerned about getting to the bathroom in time during the day (recommend pads for added protection and minimize need to rush) Other Exercises: Pt/spouse educated in [facility] frame over toilet during the day for improved safety and independence with toilet transfers Other Exercises: Pt/spouse educated in functional mobility strategies with RW within bathroom/kitchen context to improve safety and independence   Shoulder Instructions      Home Living Family/patient expects to be discharged to:: Private residence Living Arrangements: Spouse/significant other;Children Available Help at Discharge: Family;Available 24 hours/day Type of Home: Apartment Home Access: Stairs to enter [employer] of Steps: 12 x2 (landing between flights, can place chair as needed) Entrance Stairs-Rails: Right;Left;Can reach both Home Layout: One level     Bathroom Shower/Tub: Tub/shower unit;Curtain   Biochemist, clinical: Standard     Home Equipment: Environmental consultant - 2 wheels;Cane - single point;Bedside commode          Prior Functioning/Environment Level of Independence: Independent with assistive device(s)        Comments: Mod indep with ADLs, household and community mobility; intermittent use of SPC vs RW as needed due to R LE pain.  Denies fall history.        OT Problem List: Decreased strength;Decreased knowledge of use of DME or AE;Pain      OT Treatment/Interventions: Self-care/ADL training;Therapeutic exercise;Therapeutic activities;DME and/or AE instruction;Patient/family education    OT Goals(Current goals can be found in the care plan section) Acute Rehab OT Goals Patient Stated Goal: return to PLOF  OT Goal Formulation: With patient/family Time For Goal Achievement: 07/16/17 Potential to Achieve Goals: Good ADL Goals Pt Will Perform Lower Body Dressing: with modified independence;with adaptive equipment;sit to/from stand;with caregiver independent in assisting(maintaining LLE PWBing  50%) Pt Will Transfer to Toilet: with supervision;ambulating(LRAD for amb, BSC frame over toilet, LLE PWBing 50%)  OT Frequency: Min 1X/week   Barriers to D/C:            Co-evaluation              AM-PAC PT "6 Clicks" Daily Activity     Outcome Measure Help from another person eating meals?: None Help from another person taking care of personal grooming?: None Help from another person toileting, which includes using toliet, bedpan, or urinal?: A Little Help from another person bathing (including washing, rinsing, drying)?: A Little Help from another person to put on and taking off regular upper body clothing?: None Help from another person to put on and taking off regular lower body clothing?: A Little 6 Click Score: 21   End of Session    Activity Tolerance: Patient tolerated treatment well Patient left: in chair;with call bell/phone within reach;with chair alarm set;with family/visitor present;with SCD's reapplied  OT Visit Diagnosis: Other abnormalities of gait and mobility (R26.89);Muscle weakness (generalized) (M62.81);Pain Pain - Right/Left: Left Pain - part of body: Hip  Time: 1040-1059 OT Time Calculation (min): 19 min Charges:  OT General Charges $OT Visit: 1 Visit OT Evaluation $OT Eval Low Complexity: 1 Low OT Treatments $Self Care/Home Management : 8-22 mins  Jeni Salles, MPH, MS, OTR/L ascom [phone] 07/02/17, 11:11 AM

## 2017-07-02 NOTE — Anesthesia Postprocedure Evaluation (Signed)
Anesthesia Post Note  Patient: ADILYNN BESSEY  Procedure(s) Performed: TOTAL HIP ARTHROPLASTY ANTERIOR APPROACH (Left )  Patient location during evaluation: PACU Anesthesia Type: Spinal Level of consciousness: oriented and awake and alert Pain management: pain level controlled Vital Signs Assessment: post-procedure vital signs reviewed and stable Respiratory status: spontaneous breathing and respiratory function stable Cardiovascular status: blood pressure returned to baseline and stable Postop Assessment: no headache, no backache and no apparent nausea or vomiting Anesthetic complications: no     Last Vitals:  Vitals:   07/02/17 0735 07/02/17 1211  BP: 138/76 (!) 167/85  Pulse: 87 90  Resp:    Temp: 36.6 C 36.6 C  SpO2: 96% 100%    Last Pain:  Vitals:   07/02/17 0936  TempSrc:   PainSc: 5                  [facility] Harvie Heck

## 2017-07-02 NOTE — Progress Notes (Signed)
  Subjective: 1 Day Post-Op Procedure(s) (LRB): TOTAL HIP ARTHROPLASTY ANTERIOR APPROACH (Left) Patient reports pain as mild.   Patient seen in rounds with Dr. Rudene Christians. Patient is well, and has had no acute complaints or problems Plan is to go Home after hospital stay. Negative for chest pain and shortness of breath Fever: no Gastrointestinal: Negative for nausea and vomiting  Objective: Vital signs in last 24 hours: Temp:  [97.3 F (36.3 C)-99.2 F (37.3 C)] 99.2 F (37.3 C) (06/07 0334) Pulse Rate:  [69-107] 90 (06/07 0334) Resp:  [0-28] 20 (06/07 0334) BP: (115-214)/(74-123) 138/78 (06/07 0334) SpO2:  [92 %-100 %] 95 % (06/07 0334) Weight:  [130.4 kg (287 lb 7 oz)] 130.4 kg (287 lb 7 oz) (06/06 0609)  Intake/Output from previous day:  Intake/Output Summary (Last 24 hours) at 07/02/2017 0607 Last data filed at 07/02/2017 0350 Gross per 24 hour  Intake 3331.67 ml  Output 1245 ml  Net 2086.67 ml    Intake/Output this shift: Total I/O In: 1786.7 [P.O.:540; I.V.:1246.7] Out: 375 [Urine:300; Drains:75]  Labs: Recent Labs    07/01/17 1249 07/02/17 0422  HGB 12.7 11.5*   Recent Labs    07/01/17 1249 07/02/17 0422  WBC 19.7* 9.8  RBC 4.35 3.79*  HCT 38.2 33.2*  PLT 304 281   Recent Labs    07/01/17 1249 07/02/17 0422  NA  --  133*  K  --  4.0  CL  --  99*  CO2  --  22  BUN  --  15  CREATININE 1.00 1.35*  GLUCOSE  --  273*  CALCIUM  --  8.8*   No results for input(s): LABPT, INR in the last 72 hours.   EXAM General - Patient is Alert and Oriented Extremity - Sensation intact distally Dorsiflexion/Plantar flexion intact Compartment soft Dressing/Incision - clean, dry, with the wound VAC intact.  Hemovac is draining. Motor Function - intact, moving foot and toes well on exam.  The patient ambulated 40 feet yesterday and is getting up to the bathroom.  Past Medical History:  Diagnosis Date  . Allergy   . Anxiety   . Asthma    pt denies having asthma   . Chronic osteoarthritis    knees  . Deviated septum   . Diabetes mellitus without complication ([place])   . Hyperlipidemia   . Hypertension   . Irregular menstrual cycle   . Lymphadenopathy   . Obesity   . Peri-menopause   . Pneumonia 2015    Assessment/Plan: 1 Day Post-Op Procedure(s) (LRB): TOTAL HIP ARTHROPLASTY ANTERIOR APPROACH (Left) Active Problems:   Primary localized osteoarthritis of left hip  Estimated body mass index is 47.83 kg/m as calculated from the following:   Height as of 06/23/17: 5\' 5"  (1.651 m).   Weight as of this encounter: 130.4 kg (287 lb 7 oz). Advance diet Up with therapy D/C IV fluids Discharge home with home health possibly Saturday  DVT Prophylaxis - Lovenox, Foot Pumps and TED hose Weight-Bearing as tolerated to left leg  Reche Dixon, PA-C Orthopaedic Surgery 07/02/2017, 6:07 AM

## 2017-07-03 LAB — GLUCOSE, CAPILLARY: Glucose-Capillary: 253 mg/dL — ABNORMAL HIGH (ref 65–99)

## 2017-07-03 MED ORDER — OXYCODONE HCL 5 MG PO TABS: 5.0000 mg | ORAL_TABLET | ORAL | 0 refills | Status: DC | PRN

## 2017-07-03 MED ORDER — ENOXAPARIN SODIUM 40 MG/0.4ML ~~LOC~~ SOLN: 40.0000 mg | Freq: Two times a day (BID) | SUBCUTANEOUS | 0 refills | Status: DC

## 2017-07-03 MED ORDER — TRAMADOL HCL 50 MG PO TABS: 50.0000 mg | ORAL_TABLET | Freq: Four times a day (QID) | ORAL | 1 refills | Status: DC

## 2017-07-03 NOTE — Progress Notes (Signed)
  Subjective: 2 Days Post-Op Procedure(s) (LRB): TOTAL HIP ARTHROPLASTY ANTERIOR APPROACH (Left) Patient reports pain as mild.   Patient seen in rounds with Dr. Posey Pronto. Patient is well, and has had no acute complaints or problems Plan is to go Home after hospital stay. Negative for chest pain and shortness of breath Fever: no Gastrointestinal: Negative for nausea and vomiting  Objective: Vital signs in last 24 hours: Temp:  [97.8 F (36.6 C)-99.1 F (37.3 C)] 99.1 F (37.3 C) (06/07 2110) Pulse Rate:  [87-113] 100 (06/07 2110) Resp:  [19] 19 (06/07 2110) BP: (138-167)/(76-97) 158/77 (06/07 2110) SpO2:  [95 %-100 %] 95 % (06/07 2110) Weight:  [130.2 kg (287 lb)] 130.2 kg (287 lb) (06/07 0809)  Intake/Output from previous day:  Intake/Output Summary (Last 24 hours) at 07/03/2017 0655 Last data filed at 07/02/2017 1700 Gross per 24 hour  Intake 240 ml  Output -  Net 240 ml    Intake/Output this shift: No intake/output data recorded.  Labs: Recent Labs    07/01/17 1249 07/02/17 0422  HGB 12.7 11.5*   Recent Labs    07/01/17 1249 07/02/17 0422  WBC 19.7* 9.8  RBC 4.35 3.79*  HCT 38.2 33.2*  PLT 304 281   Recent Labs    07/01/17 1249 07/02/17 0422  NA  --  133*  K  --  4.0  CL  --  99*  CO2  --  22  BUN  --  15  CREATININE 1.00 1.35*  GLUCOSE  --  273*  CALCIUM  --  8.8*   No results for input(s): LABPT, INR in the last 72 hours.   EXAM General - Patient is Alert and Oriented Extremity - Sensation intact distally Dorsiflexion/Plantar flexion intact Compartment soft Dressing/Incision - clean, dry, with the wound VAC intact.   Motor Function - intact, moving foot and toes well on exam.  The patient ambulated 200 feet yesterday and is getting up to the bathroom.  Past Medical History:  Diagnosis Date  . Allergy   . Anxiety   . Asthma    pt denies having asthma  . Chronic osteoarthritis    knees  . Deviated septum   . Diabetes mellitus without  complication ([place])   . Hyperlipidemia   . Hypertension   . Irregular menstrual cycle   . Lymphadenopathy   . Obesity   . Peri-menopause   . Pneumonia 2015    Assessment/Plan: 2 Days Post-Op Procedure(s) (LRB): TOTAL HIP ARTHROPLASTY ANTERIOR APPROACH (Left) Active Problems:   Primary localized osteoarthritis of left hip  Estimated body mass index is 47.76 kg/m as calculated from the following:   Height as of this encounter: 5\' 5"  (1.651 m).   Weight as of this encounter: 130.2 kg (287 lb). Advance diet Up with therapy D/C IV fluids Discharge home with home health possibly Saturday  DVT Prophylaxis - Lovenox, Foot Pumps and TED hose Weight-Bearing as tolerated to left leg  Reche Dixon, PA-C Orthopaedic Surgery 07/03/2017, 6:55 AM

## 2017-07-03 NOTE — Care Management (Signed)
Patient to be discharged per MD order. HHPT set up via [facility]. Referral given to Amity at [facility]. RNCM to sign off Ines Bloomer, RN BSN RNCM [phone]

## 2017-07-03 NOTE — Progress Notes (Addendum)
Patient is being discharged today to home with [facility]. Husband bedside. Belongings packed. DC & RX instructions given and patient acknowledged understanding. Wound vac changed to Prevena. IV removed. Patient said she would take her 10am meds when she got home. NT wheeled patient to car.

## 2017-07-03 NOTE — Progress Notes (Signed)
PT Cancellation Note  Patient Details Name: Claire Hansen MRN: [id number] DOB: 1960/12/02   Cancelled Treatment:    Reason Eval/Treat Not Completed: Other (comment)   Pt in chair with husband.  Dressed and ready to be discharged.  Stated she has been up walking in room and bathing at sink.  Pt reported no mobility concerns and husband agreed.  Discussed with RN who agreed.  Pt with no further questions or concerns as she awaits discharge.   Chesley Noon 07/03/2017, 10:03 AM

## 2017-07-03 NOTE — Discharge Summary (Signed)
Physician Discharge Summary  Subjective: 2 Days Post-Op Procedure(s) (LRB): TOTAL HIP ARTHROPLASTY ANTERIOR APPROACH (Left) Patient reports pain as mild.   Patient seen in rounds with Dr. Posey Pronto. Patient is well, and has had no acute complaints or problems Patient is ready to go home with home health physical therapy  Physician Discharge Summary  Patient ID: Claire Hansen MRN: [id number] DOB/AGE: Jun 10, 1960 57 y.o.  Admit date: 07/01/2017 Discharge date: 07/03/2017  Admission Diagnoses:  Discharge Diagnoses:  Active Problems:   Primary localized osteoarthritis of left hip   Discharged Condition: good  Hospital Course: The patient is postop day 2 from a left total hip replacement.  She is doing well since surgery.  She has been ambulating 200 feet with physical therapy and is up in the room.  She has not had a bowel movement.  The patient is ready to go home with home health physical therapy.  Her vitals are stable.  Treatments: surgery:  TOTAL HIP ARTHROPLASTY ANTERIOR APPROACH (Left)  SURGEON: Laurene Footman, MD  ASSISTANTS: None  ANESTHESIA:   spinal  EBL:  Total I/O In: 1200 [I.V.:1200] Out: -   BLOOD ADMINISTERED:none  DRAINS: (2) Hemovact drain(s) in the subcut layer with  Suction Open   LOCAL MEDICATIONS USED:  MARCAINE     SPECIMEN:  Source of Specimen:  Left femoral head  DISPOSITION OF SPECIMEN:  PATHOLOGY  COUNTS:  YES  TOURNIQUET:  * No tourniquets in log *  IMPLANTS: Medacta AMIS 1 standard stem with 52 mm Mpact TM with liner and S 28 mm ceramic head     Discharge Exam: Blood pressure (!) 158/77, pulse 100, temperature 99.1 F (37.3 C), temperature source Oral, resp. rate 19, height 5\' 5"  (1.651 m), weight 130.2 kg (287 lb), SpO2 95 %.   Disposition: Discharge disposition: 01-Home or Self Care        Allergies as of 07/03/2017      Reactions   Shellfish Allergy Anaphylaxis, Nausea And Vomiting   Topical betadine is OK to  use.      Medication List    STOP taking these medications   ibuprofen 200 MG tablet Commonly known as:  ADVIL,MOTRIN     TAKE these medications   acetaminophen 500 MG tablet Commonly known as:  TYLENOL Take 1,000 mg by mouth every 8 (eight) hours as needed for mild pain or headache. Reported on 05/15/2015   CLARITIN 10 MG Caps Generic drug:  Loratadine Take 10 mg by mouth daily as needed (allergies).   enoxaparin 40 MG/0.4ML injection Commonly known as:  LOVENOX Inject 0.4 mLs (40 mg total) into the skin every 12 (twelve) hours.   hydrALAZINE 10 MG tablet Commonly known as:  APRESOLINE Take 10 mg by mouth 3 (three) times daily.   meloxicam 15 MG tablet Commonly known as:  MOBIC Take 15 mg by mouth at bedtime as needed for pain.   metFORMIN 500 MG 24 hr tablet Commonly known as:  GLUCOPHAGE-XR TAKE 1 TABLET BY MOUTH  DAILY WITH BREAKFAST   metoprolol succinate 25 MG 24 hr tablet Commonly known as:  TOPROL-XL Take 25 mg by mouth 2 (two) times daily.   montelukast 10 MG tablet Commonly known as:  SINGULAIR Take 1 tablet (10 mg total) by mouth daily. What changed:    when to take this  reasons to take this   Olmesartan-amLODIPine-HCTZ 40-10-25 MG Tabs Take 1 tablet by mouth daily.   oxyCODONE 5 MG immediate release tablet Commonly known as:  Oxy IR/ROXICODONE Take 1-2 tablets (5-10 mg total) by mouth every 4 (four) hours as needed for moderate pain (pain score 4-6).   spironolactone 25 MG tablet Commonly known as:  ALDACTONE Take 25 mg by mouth daily.   traMADol 50 MG tablet Commonly known as:  ULTRAM Take 1-2 tablets (50-100 mg total) by mouth every 4 (four) hours as needed. What changed:  how much to take   traMADol 50 MG tablet Commonly known as:  ULTRAM Take 1 tablet (50 mg total) by mouth every 6 (six) hours. What changed:  You were already taking a medication with the same name, and this prescription was added. Make sure you understand how and when  to take each.      Follow-up Information    Hessie Knows, MD Follow up in 2 week(s).   Specialty:  Orthopedic Surgery Contact information: [place] [address] [phone]           Signed: Prescott Parma, Manish Ruggiero 07/03/2017, 6:36 AM   Objective: Vital signs in last 24 hours: Temp:  [97.8 F (36.6 C)-99.1 F (37.3 C)] 99.1 F (37.3 C) (06/07 2110) Pulse Rate:  [87-113] 100 (06/07 2110) Resp:  [19] 19 (06/07 2110) BP: (138-167)/(76-97) 158/77 (06/07 2110) SpO2:  [95 %-100 %] 95 % (06/07 2110) Weight:  [130.2 kg (287 lb)] 130.2 kg (287 lb) (06/07 0809)  Intake/Output from previous day:  Intake/Output Summary (Last 24 hours) at 07/03/2017 0637 Last data filed at 07/02/2017 1700 Gross per 24 hour  Intake 240 ml  Output -  Net 240 ml    Intake/Output this shift: No intake/output data recorded.  Labs: Recent Labs    07/01/17 1249 07/02/17 0422  HGB 12.7 11.5*   Recent Labs    07/01/17 1249 07/02/17 0422  WBC 19.7* 9.8  RBC 4.35 3.79*  HCT 38.2 33.2*  PLT 304 281   Recent Labs    07/01/17 1249 07/02/17 0422  NA  --  133*  K  --  4.0  CL  --  99*  CO2  --  22  BUN  --  15  CREATININE 1.00 1.35*  GLUCOSE  --  273*  CALCIUM  --  8.8*   No results for input(s): LABPT, INR in the last 72 hours.  EXAM: General - Patient is Alert and Oriented Extremity - Neurovascular intact Dorsiflexion/Plantar flexion intact Compartment soft Incision - clean, dry, with the wound VAC intact.  Hemovac is intact. Motor Function -plantar flexion and dorsiflexion intact.  Ambulated 200 feet with physical therapy.  Assessment/Plan: 2 Days Post-Op Procedure(s) (LRB): TOTAL HIP ARTHROPLASTY ANTERIOR APPROACH (Left) Procedure(s) (LRB): TOTAL HIP ARTHROPLASTY ANTERIOR APPROACH (Left) Past Medical History:  Diagnosis Date  . Allergy   . Anxiety   . Asthma    pt denies having asthma  . Chronic osteoarthritis    knees  .  Deviated septum   . Diabetes mellitus without complication ([place])   . Hyperlipidemia   . Hypertension   . Irregular menstrual cycle   . Lymphadenopathy   . Obesity   . Peri-menopause   . Pneumonia 2015   Active Problems:   Primary localized osteoarthritis of left hip  Estimated body mass index is 47.76 kg/m as calculated from the following:   Height as of this encounter: 5\' 5"  (1.651 m).   Weight as of this encounter: 130.2 kg (287 lb). Advance diet Up with therapy Discharge home with home health Diet - Regular diet  Follow up - in 2 weeks Activity - WBAT Disposition - Home Condition Upon Discharge - Good DVT Prophylaxis - Lovenox and TED hose  Reche Dixon, PA-C Orthopaedic Surgery 07/03/2017, 6:37 AM

## 2017-07-05 LAB — SURGICAL PATHOLOGY

## 2017-08-04 ENCOUNTER — Other Ambulatory Visit: Payer: Self-pay | Admitting: Family Medicine

## 2018-01-28 ENCOUNTER — Other Ambulatory Visit: Payer: Self-pay | Admitting: Family Medicine

## 2018-01-28 DIAGNOSIS — N63 Unspecified lump in unspecified breast: Secondary | ICD-10-CM

## 2018-06-23 ENCOUNTER — Other Ambulatory Visit: Payer: Self-pay | Admitting: Family Medicine

## 2018-06-23 DIAGNOSIS — N63 Unspecified lump in unspecified breast: Secondary | ICD-10-CM

## 2018-07-01 ENCOUNTER — Ambulatory Visit
Admission: RE | Admit: 2018-07-01 | Discharge: 2018-07-01 | Disposition: A | Discharge status: Home / Self Care | Payer: Managed Care, Other (non HMO) | Source: Ambulatory Visit | Attending: Family Medicine | Admitting: Family Medicine

## 2018-07-01 ENCOUNTER — Other Ambulatory Visit: Payer: Self-pay

## 2018-07-01 DIAGNOSIS — N63 Unspecified lump in unspecified breast: Secondary | ICD-10-CM | POA: Diagnosis present

## 2018-11-22 NOTE — Telephone Encounter (Signed)
error 

## 2019-04-14 ENCOUNTER — Ambulatory Visit: Payer: Managed Care, Other (non HMO) | Admitting: Dermatology

## 2019-04-14 ENCOUNTER — Other Ambulatory Visit: Payer: Self-pay

## 2019-04-14 DIAGNOSIS — L308 Other specified dermatitis: Secondary | ICD-10-CM | POA: Diagnosis not present

## 2019-04-14 DIAGNOSIS — L7211 Pilar cyst: Secondary | ICD-10-CM | POA: Diagnosis not present

## 2019-04-14 MED ORDER — FLUOCINOLONE ACETONIDE BODY 0.01 % EX OIL: TOPICAL_OIL | CUTANEOUS | 3 refills | Status: DC

## 2019-04-14 MED ORDER — CLOBETASOL PROPIONATE 0.05 % EX SOLN: CUTANEOUS | 1 refills | Status: DC

## 2019-04-14 MED ORDER — CLOBETASOL PROP EMOLLIENT BASE 0.05 % EX CREA: TOPICAL_CREAM | CUTANEOUS | 2 refills | Status: DC

## 2019-04-14 NOTE — Progress Notes (Signed)
   Follow-Up Visit   Subjective  Claire Hansen is a 59 y.o. female who presents for the following: Knots (L posterior scalp x 2, present for years. Asymptomatic.) and Patches (Present on elbows x 6 mos, not itchy. History of rash inframammary. Scalp itchy and bleeds. Treated for eczema in past.).  The following portions of the chart were reviewed this encounter and updated as appropriate:     Review of Systems: No other skin or systemic complaints.  Objective  Well appearing patient in no apparent distress; mood and affect are within normal limits.  A focused examination was performed including scalp, elbows. Relevant physical exam findings are noted in the Assessment and Plan.  Objective  Bilateral elbows, forearms: Hyperpigmented lichenified patches. Hyperpigmented and erythematous patches on frontal scalp. Hyperpigmentation left inframammary and gluteal cleft. Pt has hair pulled tightly over scalp and will take down hairdo on f/up visit (Vs Psoriasis)  Objective  Mid Occipital Scalp, Left Occipital Scalp: Subcutaneous nodule at scalp, 2.0 cm each.  Assessment & Plan  Other eczema Bilateral elbows, forearms  Possible change of diagnosis on f/u to psoriasis due to typical psoriasis locations (pt has had gluteal cleft and inframammary areas affected in past), although rash more clinically c/w ezcema. May switch to [facility] if topical steroids not helping. Pt will wear hair down for better examination on follow-up.  Fluocinolone Acetonide Body 0.01 % OIL - Bilateral elbows, forearms  Clobetasol Prop Emollient Base (CLOBETASOL PROPIONATE E) 0.05 % emollient cream - Bilateral elbows, forearms  Reordered Medications clobetasol (TEMOVATE) 0.05 % external solution  Pilar cyst Mid Occipital Scalp, Left Occipital Scalp  Discussed excision x 2, scheduled 1 week apart. Discussed resulting scar.  Pre-op form given.  Return in about 2 weeks (around 04/28/2019) for f/u eczema vs  psoriasis. Also schedule pt for surgeries x 2, 1 week apart for cysts on scalp.Lindi Adie, CMA, am acting as scribe for Brendolyn Patty, MD .

## 2019-05-08 ENCOUNTER — Ambulatory Visit: Payer: Managed Care, Other (non HMO) | Admitting: Dermatology

## 2019-05-26 ENCOUNTER — Other Ambulatory Visit: Payer: Self-pay | Admitting: Internal Medicine

## 2019-05-26 DIAGNOSIS — Z1231 Encounter for screening mammogram for malignant neoplasm of breast: Secondary | ICD-10-CM

## 2019-05-26 DIAGNOSIS — I10 Essential (primary) hypertension: Secondary | ICD-10-CM | POA: Insufficient documentation

## 2019-05-29 ENCOUNTER — Telehealth: Payer: Self-pay

## 2019-05-29 NOTE — Telephone Encounter (Signed)
We do not routinely give antibiotics prior to minor skin surgery in patients with h/o joint replacements, unless the patient's orthopedist recommends it.  She could check with him/her if she has any concerns or questions.

## 2019-05-29 NOTE — Telephone Encounter (Signed)
Patient came and dropped off medication list for her surgeries coming up. She is not on any blood thinners or aspirin.   She did want to know did she need any antibiotics due to having a total hip replacement.

## 2019-05-30 NOTE — Telephone Encounter (Signed)
Patient advised of information per Dr. Nicole Kindred.

## 2019-06-03 IMAGING — DX DG HIP (WITH OR WITHOUT PELVIS) 2-3V*R*
2 series · 2 of 2 positions shown · non-contrast
Comparison: Intraoperative x-rays from same date.

CLINICAL DATA: Total hip replacement.

EXAM:
DG HIP (WITH OR WITHOUT PELVIS) 2-3V RIGHT

[hip ap]
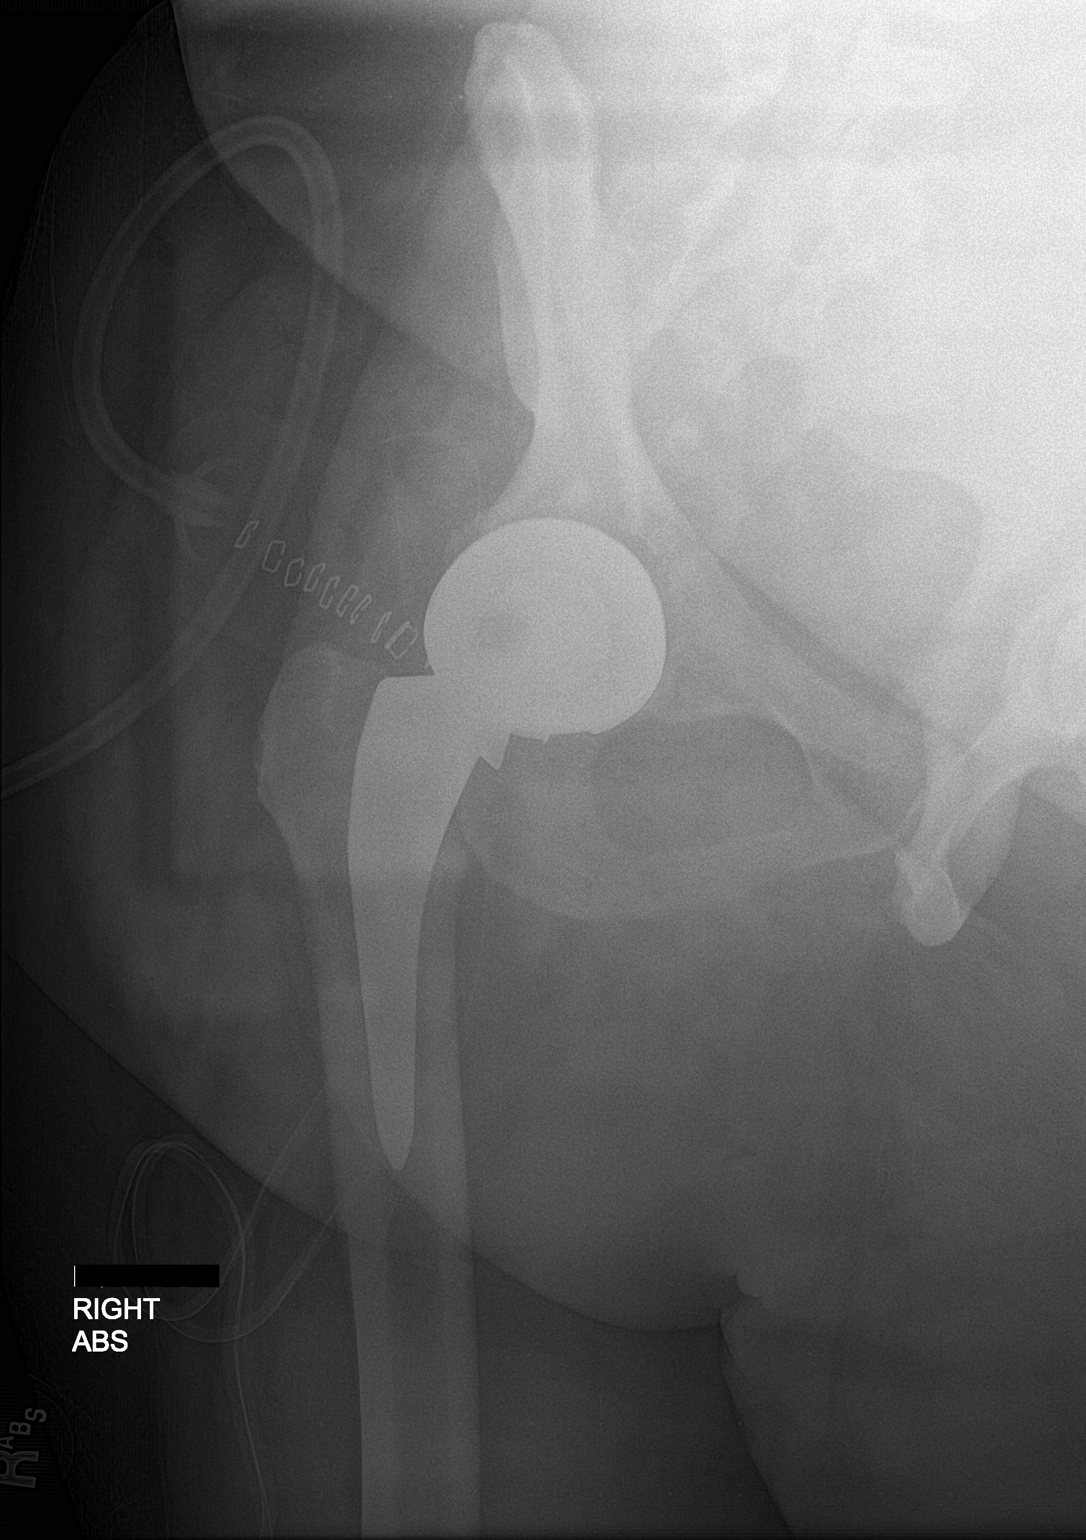

[hip lat]
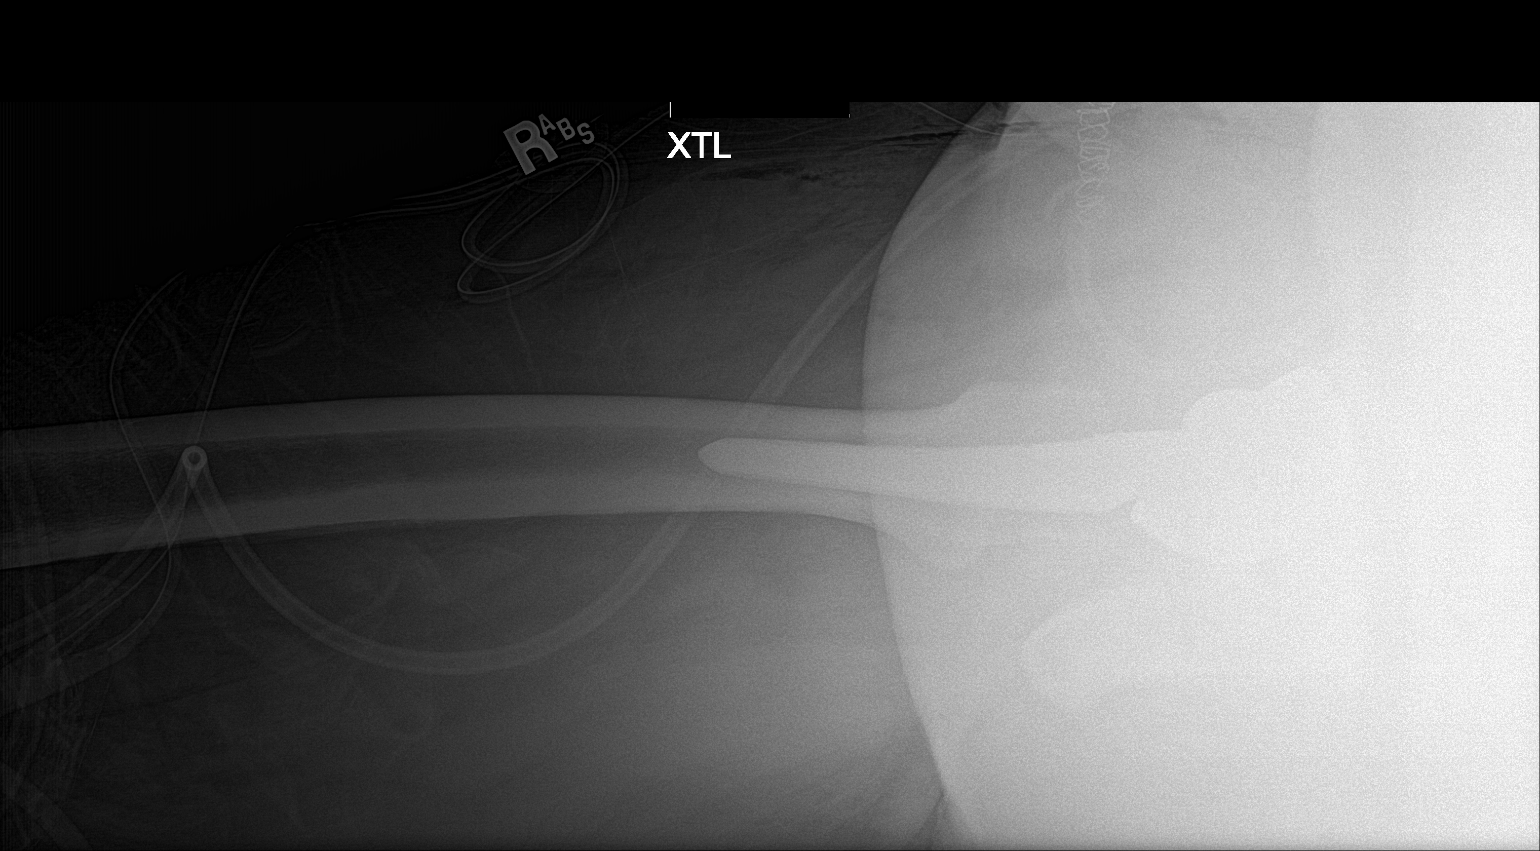

[2 of 2 positions shown; findings below may reference images not displayed]

FINDINGS: The patient is rotated on the AP view. Postsurgical changes related
to right total hip arthroplasty. Components are well aligned. No
fracture or dislocation. Expected postsurgical changes about the
right hip including subcutaneous emphysema, surgical drains, and
skin staples.
IMPRESSION: Postsurgical changes related to right total hip arthroplasty without
evidence of acute postoperative complication.

## 2019-06-05 ENCOUNTER — Other Ambulatory Visit: Payer: Self-pay

## 2019-06-05 ENCOUNTER — Ambulatory Visit (INDEPENDENT_AMBULATORY_CARE_PROVIDER_SITE_OTHER): Payer: Managed Care, Other (non HMO) | Admitting: Dermatology

## 2019-06-05 DIAGNOSIS — D485 Neoplasm of uncertain behavior of skin: Secondary | ICD-10-CM | POA: Diagnosis not present

## 2019-06-05 NOTE — Patient Instructions (Signed)

## 2019-06-05 NOTE — Progress Notes (Addendum)
   Follow-Up Visit   Subjective  Claire Hansen is a 59 y.o. female who presents for the following: Procedure (Pilar cyst of the left occipital scalp).   The following portions of the chart were reviewed this encounter and updated as appropriate:      Review of Systems:  No other skin or systemic complaints except as noted in HPI or Assessment and Plan.  Objective  Well appearing patient in no apparent distress; mood and affect are within normal limits.  A focused examination was performed including scalp. Relevant physical exam findings are noted in the Assessment and Plan.  Objective  Left Occipital Scalp: 2.8 x 2.5cm subcutaneous nodule with overlying mild erythema.   Assessment & Plan  Neoplasm of uncertain behavior of skin Left Occipital Scalp  Skin excision  Lesion length (cm):  2.8 Lesion width (cm):  2.5 Margin per side (cm):  0 Total excision diameter (cm):  2.8 Informed consent: discussed and consent obtained   Timeout: patient name, date of birth, surgical site, and procedure verified   Procedure prep:  Patient was prepped and draped in usual sterile fashion Prep type:  Povidone-iodine Anesthesia: the lesion was anesthetized in a standard fashion   Anesthetic:  1% lidocaine w/ epinephrine 1-100,000 buffered w/ 8.4% NaHCO3 (11cc) Instrument used: #15 blade   Hemostasis achieved with: pressure and electrodesiccation    Skin repair Complexity:  Intermediate Final length (cm):  2.6 Reason for type of repair: reduce tension to allow closure, reduce subcutaneous dead space and avoid a hematoma and enhance both functionality and cosmetic results   Undermining: edges could be approximated without difficulty and edges undermined   Subcutaneous layers (deep stitches):  Suture size:  3-0 Suture type: Vicryl (polyglactin 910)   Stitches:  Buried vertical mattress Fine/surface layer approximation (top stitches):  Suture size:  3-0 Suture type: Prolene  (polypropylene)   Stitches: vertical mattress   Suture removal (days):  7 Hemostasis achieved with: suture and pressure Outcome: patient tolerated procedure well with no complications   Post-procedure details: sterile dressing applied and wound care instructions given   Dressing type: pressure dressing (Mupirocin)    Other Related Procedures Pathology ([facility])  Return in about 1 week (around 06/12/2019) for as scheduled for SR and Cyst exc .   Documentation: I have reviewed the above documentation for accuracy and completeness, and I agree with the above.  Brendolyn Patty MD

## 2019-06-12 ENCOUNTER — Ambulatory Visit: Payer: Managed Care, Other (non HMO) | Admitting: Dermatology

## 2019-06-12 ENCOUNTER — Other Ambulatory Visit: Payer: Self-pay | Admitting: Dermatology

## 2019-06-12 ENCOUNTER — Other Ambulatory Visit: Payer: Self-pay

## 2019-06-12 DIAGNOSIS — Z4802 Encounter for removal of sutures: Secondary | ICD-10-CM

## 2019-06-12 DIAGNOSIS — L7211 Pilar cyst: Secondary | ICD-10-CM | POA: Diagnosis not present

## 2019-06-12 NOTE — Progress Notes (Signed)
   Follow-Up Visit   Subjective  Claire Hansen is a 59 y.o. female who presents for the following: Cyst (mid occipital scalp, pt presents for excision) and cyst vs other post op (L occipital scalp, pt presents for suture removal).  Cysts in scalp are symptomatic.  No problems with healing process after first one removed.   The following portions of the chart were reviewed this encounter and updated as appropriate:      Review of Systems:  No other skin or systemic complaints except as noted in HPI or Assessment and Plan.  Objective  Well appearing patient in no apparent distress; mood and affect are within normal limits.  A focused examination was performed including scalp. Relevant physical exam findings are noted in the Assessment and Plan.  Objective     mid occipital scalp: Subcutaneous nodule at scalp 2.1 x 1.5cm  L occipital scalp: Healing excision site, no evid of infection   Assessment & Plan  Pilar cyst (2) L occipital scalp; mid occipital scalp  Pilar cyst vs other L occipital scalp - awaiting pathology results.  Wound cleansed, sutures removed, wound cleansed.  Skin excision - mid occipital scalp  Lesion length (cm):  2.1 Lesion width (cm):  1.5 Margin per side (cm):  0 Total excision diameter (cm):  2.1 Informed consent: discussed and consent obtained   Timeout: patient name, date of birth, surgical site, and procedure verified   Procedure prep:  Patient was prepped and draped in usual sterile fashion Prep type:  Povidone-iodine Anesthesia: the lesion was anesthetized in a standard fashion   Anesthetic:  1% lidocaine w/ epinephrine 1-100,000 buffered w/ 8.4% NaHCO3 (11cc) Instrument used: #15 blade   Hemostasis achieved with: pressure   Outcome: patient tolerated procedure well with no complications    Skin repair - mid occipital scalp Complexity:  Intermediate Final length (cm):  1.8 Reason for type of repair: reduce tension to allow closure,  reduce subcutaneous dead space and avoid a hematoma and enhance both functionality and cosmetic results   Undermining: edges could be approximated without difficulty and edges undermined   Subcutaneous layers (deep stitches):  Suture size:  4-0 Suture type: Vicryl (polyglactin 910)   Stitches:  Buried vertical mattress Fine/surface layer approximation (top stitches):  Suture size:  3-0 Suture type: Prolene (polypropylene)   Stitches: vertical mattress and simple interrupted   Suture removal (days):  7 Hemostasis achieved with: suture Outcome: patient tolerated procedure well with no complications   Post-procedure details: wound care instructions given   Dressing: Mupirocin.    Anatomic Pathology Report - mid occipital scalp  Return in 1 week (on 06/19/2019) for sr.   I, Othelia Pulling, RMA, am acting as scribe for Brendolyn Patty, MD .  Documentation: I have reviewed the above documentation for accuracy and completeness, and I agree with the above.  Brendolyn Patty MD

## 2019-06-12 NOTE — Patient Instructions (Signed)

## 2019-06-13 ENCOUNTER — Telehealth: Payer: Self-pay

## 2019-06-13 NOTE — Telephone Encounter (Signed)
Lft pt msg to call if any problems after yesterdays surgery./sh 

## 2019-06-15 LAB — ANATOMIC PATHOLOGY REPORT

## 2019-06-19 ENCOUNTER — Ambulatory Visit (INDEPENDENT_AMBULATORY_CARE_PROVIDER_SITE_OTHER): Payer: Managed Care, Other (non HMO) | Admitting: Dermatology

## 2019-06-19 ENCOUNTER — Other Ambulatory Visit: Payer: Self-pay

## 2019-06-19 DIAGNOSIS — L309 Dermatitis, unspecified: Secondary | ICD-10-CM | POA: Diagnosis not present

## 2019-06-19 DIAGNOSIS — L7211 Pilar cyst: Secondary | ICD-10-CM

## 2019-06-19 NOTE — Progress Notes (Signed)
   Follow-Up Visit   Subjective  Claire Hansen is a 59 y.o. female who presents for the following: Post op (Cyst - mid occipital scalp). Also f/up eczema scalp and arms.  Using Clobetesol cream, solution and Dermasmoothe oil.  Skin is improved with decreased symptoms.   The following portions of the chart were reviewed this encounter and updated as appropriate:      Review of Systems:  No other skin or systemic complaints except as noted in HPI or Assessment and Plan.  Objective  Well appearing patient in no apparent distress; mood and affect are within normal limits.  A focused examination was performed including scalp. Relevant physical exam findings are noted in the Assessment and Plan.  Objective  Elbows, forearms, frontal scalp: Elbows and forearms with hyperpigmented patches with mild focal scaling. Scalp clear.  Objective  Mid Occipital Scalp: Incision site is clean, dry and intact Pathology ([facility]) confirmed benign pilar cyst    Assessment & Plan  Eczema, unspecified type Elbows, forearms, frontal scalp  vs Psoriasis Improved Continue Fluocinolone Oil qd/bid scalp. Continue Clobetasol cream qd/bid AAs prn flares.  Continue Clobetasol solution qd/bid AAs prn flares.  Topical steroids (such as triamcinolone, fluocinolone, fluocinonide, mometasone, clobetasol, halobetasol, betamethasone, hydrocortisone) can cause thinning and lightening of the skin if they are used for too long in the same area. Your physician has selected the right strength medicine for your problem and area affected on the body. Please use your medication only as directed by your physician to prevent side effects.    Pilar cyst Mid Occipital Scalp  Patient presents for suture removal. The wound is well healed without signs of infection.  The sutures are removed.   Return if symptoms worsen or fail to improve.   IJamesetta Orleans, CMA, am acting as scribe for Brendolyn Patty, MD  .  Documentation: I have reviewed the above documentation for accuracy and completeness, and I agree with the above.  Brendolyn Patty MD

## 2019-07-03 ENCOUNTER — Ambulatory Visit
Admission: RE | Admit: 2019-07-03 | Discharge: 2019-07-03 | Disposition: A | Discharge status: Home / Self Care | Payer: Managed Care, Other (non HMO) | Source: Ambulatory Visit | Attending: Internal Medicine | Admitting: Internal Medicine

## 2019-07-03 DIAGNOSIS — Z1231 Encounter for screening mammogram for malignant neoplasm of breast: Secondary | ICD-10-CM | POA: Diagnosis present

## 2019-09-01 ENCOUNTER — Other Ambulatory Visit: Payer: Self-pay | Admitting: Family

## 2019-09-01 DIAGNOSIS — N898 Other specified noninflammatory disorders of vagina: Secondary | ICD-10-CM

## 2019-09-01 DIAGNOSIS — R102 Pelvic and perineal pain unspecified side: Secondary | ICD-10-CM

## 2019-09-11 ENCOUNTER — Ambulatory Visit: Payer: Managed Care, Other (non HMO)

## 2019-10-16 ENCOUNTER — Other Ambulatory Visit: Payer: Self-pay

## 2019-10-16 ENCOUNTER — Ambulatory Visit
Admission: RE | Admit: 2019-10-16 | Discharge: 2019-10-16 | Disposition: A | Discharge status: Home / Self Care | Payer: Managed Care, Other (non HMO) | Source: Ambulatory Visit | Attending: Family | Admitting: Family

## 2019-10-16 DIAGNOSIS — N898 Other specified noninflammatory disorders of vagina: Secondary | ICD-10-CM | POA: Insufficient documentation

## 2019-10-16 DIAGNOSIS — R102 Pelvic and perineal pain: Secondary | ICD-10-CM | POA: Insufficient documentation

## 2020-01-12 ENCOUNTER — Other Ambulatory Visit: Payer: Self-pay | Admitting: Orthopedic Surgery

## 2020-02-02 ENCOUNTER — Inpatient Hospital Stay: Admission: RE | Admit: 2020-02-02 | Payer: Managed Care, Other (non HMO) | Source: Ambulatory Visit

## 2020-02-07 ENCOUNTER — Other Ambulatory Visit: Payer: Managed Care, Other (non HMO)

## 2020-02-09 ENCOUNTER — Inpatient Hospital Stay
Admission: RE | Admit: 2020-02-09 | Payer: Managed Care, Other (non HMO) | Source: Home / Self Care | Admitting: Orthopedic Surgery

## 2020-02-09 ENCOUNTER — Encounter: Admission: RE | Payer: Self-pay | Source: Home / Self Care

## 2020-02-09 SURGERY — REVISION, TOTAL ARTHROPLASTY, HIP, ANTERIOR APPROACH
Anesthesia: Choice | Site: Hip

## 2020-03-15 ENCOUNTER — Other Ambulatory Visit: Payer: Self-pay | Admitting: Orthopedic Surgery

## 2020-03-18 ENCOUNTER — Other Ambulatory Visit: Payer: Self-pay | Admitting: Orthopedic Surgery

## 2020-03-18 ENCOUNTER — Other Ambulatory Visit (HOSPITAL_COMMUNITY): Payer: Self-pay | Admitting: Orthopedic Surgery

## 2020-03-18 DIAGNOSIS — M5106 Intervertebral disc disorders with myelopathy, lumbar region: Secondary | ICD-10-CM

## 2020-03-18 DIAGNOSIS — M5442 Lumbago with sciatica, left side: Secondary | ICD-10-CM

## 2020-03-18 DIAGNOSIS — M9702XA Periprosthetic fracture around internal prosthetic left hip joint, initial encounter: Secondary | ICD-10-CM | POA: Insufficient documentation

## 2020-03-19 ENCOUNTER — Other Ambulatory Visit: Payer: Self-pay

## 2020-03-19 ENCOUNTER — Ambulatory Visit
Admission: RE | Admit: 2020-03-19 | Discharge: 2020-03-19 | Disposition: A | Discharge status: Home / Self Care | Payer: Managed Care, Other (non HMO) | Source: Ambulatory Visit | Attending: Orthopedic Surgery | Admitting: Orthopedic Surgery

## 2020-03-19 DIAGNOSIS — M5106 Intervertebral disc disorders with myelopathy, lumbar region: Secondary | ICD-10-CM | POA: Diagnosis not present

## 2020-03-19 DIAGNOSIS — M5442 Lumbago with sciatica, left side: Secondary | ICD-10-CM | POA: Diagnosis present

## 2020-03-22 ENCOUNTER — Other Ambulatory Visit: Admission: RE | Admit: 2020-03-22 | Payer: Managed Care, Other (non HMO) | Source: Ambulatory Visit

## 2020-03-22 ENCOUNTER — Emergency Department: Payer: Managed Care, Other (non HMO)

## 2020-03-22 ENCOUNTER — Observation Stay: Payer: Managed Care, Other (non HMO)

## 2020-03-22 ENCOUNTER — Other Ambulatory Visit: Payer: Self-pay

## 2020-03-22 ENCOUNTER — Encounter: Payer: Self-pay | Admitting: Emergency Medicine

## 2020-03-22 ENCOUNTER — Observation Stay
Admission: EM | Admit: 2020-03-22 | Discharge: 2020-03-24 | Disposition: A | Discharge status: Home / Self Care | Payer: Managed Care, Other (non HMO) | Attending: Family Medicine | Admitting: Family Medicine

## 2020-03-22 DIAGNOSIS — Z20822 Contact with and (suspected) exposure to covid-19: Secondary | ICD-10-CM | POA: Insufficient documentation

## 2020-03-22 DIAGNOSIS — I160 Hypertensive urgency: Principal | ICD-10-CM | POA: Insufficient documentation

## 2020-03-22 DIAGNOSIS — E782 Mixed hyperlipidemia: Secondary | ICD-10-CM

## 2020-03-22 DIAGNOSIS — R202 Paresthesia of skin: Secondary | ICD-10-CM | POA: Diagnosis present

## 2020-03-22 DIAGNOSIS — Z8709 Personal history of other diseases of the respiratory system: Secondary | ICD-10-CM

## 2020-03-22 DIAGNOSIS — E119 Type 2 diabetes mellitus without complications: Secondary | ICD-10-CM | POA: Insufficient documentation

## 2020-03-22 DIAGNOSIS — I10 Essential (primary) hypertension: Secondary | ICD-10-CM

## 2020-03-22 DIAGNOSIS — M79605 Pain in left leg: Secondary | ICD-10-CM

## 2020-03-22 DIAGNOSIS — Z79899 Other long term (current) drug therapy: Secondary | ICD-10-CM | POA: Diagnosis not present

## 2020-03-22 DIAGNOSIS — I1A0 Resistant hypertension: Secondary | ICD-10-CM | POA: Diagnosis present

## 2020-03-22 DIAGNOSIS — Z7984 Long term (current) use of oral hypoglycemic drugs: Secondary | ICD-10-CM | POA: Insufficient documentation

## 2020-03-22 DIAGNOSIS — M1711 Unilateral primary osteoarthritis, right knee: Secondary | ICD-10-CM

## 2020-03-22 DIAGNOSIS — E785 Hyperlipidemia, unspecified: Secondary | ICD-10-CM | POA: Diagnosis present

## 2020-03-22 DIAGNOSIS — J45909 Unspecified asthma, uncomplicated: Secondary | ICD-10-CM | POA: Diagnosis not present

## 2020-03-22 DIAGNOSIS — M1612 Unilateral primary osteoarthritis, left hip: Secondary | ICD-10-CM

## 2020-03-22 DIAGNOSIS — Z6841 Body Mass Index (BMI) 40.0 and over, adult: Secondary | ICD-10-CM

## 2020-03-22 DIAGNOSIS — Z96643 Presence of artificial hip joint, bilateral: Secondary | ICD-10-CM | POA: Insufficient documentation

## 2020-03-22 HISTORY — DX: Pain in left leg: M79.605

## 2020-03-22 LAB — CBC WITH DIFFERENTIAL/PLATELET
Abs Immature Granulocytes: 0.03 10*3/uL (ref 0.00–0.07)
Basophils Absolute: 0 10*3/uL (ref 0.0–0.1)
Basophils Relative: 0 %
Eosinophils Absolute: 0.2 10*3/uL (ref 0.0–0.5)
Eosinophils Relative: 2 %
HCT: 41.3 % (ref 36.0–46.0)
Hemoglobin: 14.3 g/dL (ref 12.0–15.0)
Immature Granulocytes: 0 %
Lymphocytes Relative: 26 %
Lymphs Abs: 2.7 10*3/uL (ref 0.7–4.0)
MCH: 28.8 pg (ref 26.0–34.0)
MCHC: 34.6 g/dL (ref 30.0–36.0)
MCV: 83.3 fL (ref 80.0–100.0)
Monocytes Absolute: 0.6 10*3/uL (ref 0.1–1.0)
Monocytes Relative: 6 %
Neutro Abs: 6.8 10*3/uL (ref 1.7–7.7)
Neutrophils Relative %: 66 %
Platelets: 345 10*3/uL (ref 150–400)
RBC: 4.96 MIL/uL (ref 3.87–5.11)
RDW: 13.5 % (ref 11.5–15.5)
WBC: 10.4 10*3/uL (ref 4.0–10.5)
nRBC: 0 % (ref 0.0–0.2)

## 2020-03-22 LAB — COMPREHENSIVE METABOLIC PANEL
ALT: 21 U/L (ref 0–44)
AST: 24 U/L (ref 15–41)
Albumin: 4.5 g/dL (ref 3.5–5.0)
Alkaline Phosphatase: 79 U/L (ref 38–126)
Anion gap: 12 (ref 5–15)
BUN: 14 mg/dL (ref 6–20)
CO2: 23 mmol/L (ref 22–32)
Calcium: 10.3 mg/dL (ref 8.9–10.3)
Chloride: 99 mmol/L (ref 98–111)
Creatinine, Ser: 0.94 mg/dL (ref 0.44–1.00)
GFR, Estimated: >60 mL/min (ref >60)
Glucose, Bld: 200 mg/dL — ABNORMAL HIGH (ref 70–99)
Potassium: 4 mmol/L (ref 3.5–5.1)
Sodium: 134 mmol/L — ABNORMAL LOW (ref 135–145)
Total Bilirubin: 0.8 mg/dL (ref 0.3–1.2)
Total Protein: 8.3 g/dL — ABNORMAL HIGH (ref 6.5–8.1)

## 2020-03-22 LAB — CBG MONITORING, ED: Glucose-Capillary: 227 mg/dL — ABNORMAL HIGH (ref 70–99)

## 2020-03-22 LAB — TROPONIN I (HIGH SENSITIVITY)
Troponin I (High Sensitivity): 33 ng/L — ABNORMAL HIGH (ref <18)
Troponin I (High Sensitivity): 35 ng/L — ABNORMAL HIGH (ref <18)
Troponin I (High Sensitivity): 36 ng/L — ABNORMAL HIGH (ref <18)

## 2020-03-22 MED ORDER — METOPROLOL TARTRATE 5 MG/5ML IV SOLN: 5.0000 mg | INTRAVENOUS | Status: DC | PRN

## 2020-03-22 MED ORDER — METFORMIN HCL ER 500 MG PO TB24
500.0000 mg | ORAL_TABLET | Freq: Two times a day (BID) | ORAL | Status: DC
  Administered 2020-03-23 – 2020-03-24 (×3): 500 mg via ORAL
  Filled 2020-03-22 (×4): qty 1

## 2020-03-22 MED ORDER — INSULIN ASPART 100 UNIT/ML ~~LOC~~ SOLN
0.0000 [IU] | Freq: Every day | SUBCUTANEOUS | Status: DC
  Administered 2020-03-22 – 2020-03-23 (×2): 2 [IU] via SUBCUTANEOUS
  Filled 2020-03-22 (×2): qty 1

## 2020-03-22 MED ORDER — METOPROLOL SUCCINATE ER 25 MG PO TB24
25.0000 mg | ORAL_TABLET | Freq: Every day | ORAL | Status: DC
  Administered 2020-03-23 – 2020-03-24 (×2): 25 mg via ORAL
  Filled 2020-03-22 (×2): qty 1

## 2020-03-22 MED ORDER — MORPHINE SULFATE (PF) 4 MG/ML IV SOLN
4.0000 mg | INTRAVENOUS | Status: AC | PRN
  Administered 2020-03-22 – 2020-03-23 (×4): 4 mg via INTRAVENOUS
  Filled 2020-03-22 (×4): qty 1

## 2020-03-22 MED ORDER — NITROGLYCERIN 0.4 MG SL SUBL: 0.4000 mg | SUBLINGUAL_TABLET | SUBLINGUAL | Status: DC | PRN

## 2020-03-22 MED ORDER — SPIRONOLACTONE 25 MG PO TABS
25.0000 mg | ORAL_TABLET | Freq: Every day | ORAL | Status: DC
Start: 2020-03-23 — End: 2020-03-24
  Administered 2020-03-23 – 2020-03-24 (×2): 25 mg via ORAL
  Filled 2020-03-22 (×3): qty 1

## 2020-03-22 MED ORDER — OXYCODONE-ACETAMINOPHEN 5-325 MG PO TABS
1.0000 | ORAL_TABLET | Freq: Once | ORAL | Status: AC
Start: 2020-03-22 — End: 2020-03-22
  Administered 2020-03-22: 1 via ORAL
  Filled 2020-03-22: qty 1

## 2020-03-22 MED ORDER — ENOXAPARIN SODIUM 60 MG/0.6ML ~~LOC~~ SOLN
0.5000 mg/kg | SUBCUTANEOUS | Status: DC
  Administered 2020-03-22 – 2020-03-23 (×2): 55 mg via SUBCUTANEOUS
  Filled 2020-03-22 (×2): qty 0.6

## 2020-03-22 MED ORDER — MORPHINE SULFATE (PF) 2 MG/ML IV SOLN
2.0000 mg | INTRAVENOUS | Status: DC | PRN
  Administered 2020-03-22: 2 mg via INTRAVENOUS
  Filled 2020-03-22: qty 1

## 2020-03-22 MED ORDER — HYDRALAZINE HCL 10 MG PO TABS
10.0000 mg | ORAL_TABLET | Freq: Every day | ORAL | Status: DC
  Administered 2020-03-23 – 2020-03-24 (×2): 10 mg via ORAL
  Filled 2020-03-22 (×2): qty 1

## 2020-03-22 MED ORDER — ONDANSETRON HCL 4 MG/2ML IJ SOLN
4.0000 mg | Freq: Once | INTRAMUSCULAR | Status: AC
  Administered 2020-03-22: 4 mg via INTRAVENOUS
  Filled 2020-03-22: qty 2

## 2020-03-22 MED ORDER — MORPHINE SULFATE (PF) 4 MG/ML IV SOLN
4.0000 mg | INTRAVENOUS | Status: DC | PRN
  Administered 2020-03-22: 4 mg via INTRAVENOUS
  Filled 2020-03-22: qty 1

## 2020-03-22 MED ORDER — VITAMIN D (ERGOCALCIFEROL) 1.25 MG (50000 UNIT) PO CAPS: 50000.0000 [IU] | ORAL_CAPSULE | ORAL | Status: DC

## 2020-03-22 MED ORDER — ACETAMINOPHEN 650 MG RE SUPP: 325.0000 mg | Freq: Four times a day (QID) | RECTAL | Status: DC | PRN

## 2020-03-22 MED ORDER — OLMESARTAN-AMLODIPINE-HCTZ 40-10-25 MG PO TABS: 1.0000 | ORAL_TABLET | Freq: Every day | ORAL | Status: DC

## 2020-03-22 MED ORDER — ONDANSETRON HCL 4 MG/2ML IJ SOLN: 4.0000 mg | Freq: Four times a day (QID) | INTRAMUSCULAR | Status: DC | PRN

## 2020-03-22 MED ORDER — AMLODIPINE BESYLATE 5 MG PO TABS
5.0000 mg | ORAL_TABLET | Freq: Once | ORAL | Status: AC
  Administered 2020-03-22: 5 mg via ORAL
  Filled 2020-03-22: qty 1

## 2020-03-22 MED ORDER — HYDROMORPHONE HCL 1 MG/ML IJ SOLN
0.5000 mg | INTRAMUSCULAR | Status: DC | PRN
Start: 2020-03-22 — End: 2020-03-22
  Administered 2020-03-22: 0.5 mg via INTRAVENOUS
  Filled 2020-03-22: qty 1

## 2020-03-22 MED ORDER — ONDANSETRON HCL 4 MG PO TABS: 4.0000 mg | ORAL_TABLET | Freq: Four times a day (QID) | ORAL | Status: DC | PRN

## 2020-03-22 MED ORDER — AMLODIPINE BESYLATE 10 MG PO TABS
10.0000 mg | ORAL_TABLET | Freq: Every day | ORAL | Status: DC
  Administered 2020-03-23 – 2020-03-24 (×2): 10 mg via ORAL
  Filled 2020-03-22 (×2): qty 1

## 2020-03-22 MED ORDER — LABETALOL HCL 5 MG/ML IV SOLN
5.0000 mg | Freq: Once | INTRAVENOUS | Status: AC
  Administered 2020-03-22: 5 mg via INTRAVENOUS
  Filled 2020-03-22: qty 4

## 2020-03-22 MED ORDER — ENOXAPARIN SODIUM 40 MG/0.4ML ~~LOC~~ SOLN: 40.0000 mg | SUBCUTANEOUS | Status: DC

## 2020-03-22 MED ORDER — HYDROCHLOROTHIAZIDE 25 MG PO TABS
25.0000 mg | ORAL_TABLET | Freq: Every day | ORAL | Status: DC
  Administered 2020-03-23 – 2020-03-24 (×2): 25 mg via ORAL
  Filled 2020-03-22 (×2): qty 1

## 2020-03-22 MED ORDER — ACETAMINOPHEN 325 MG PO TABS: 325.0000 mg | ORAL_TABLET | Freq: Four times a day (QID) | ORAL | Status: DC | PRN

## 2020-03-22 MED ORDER — IRBESARTAN 150 MG PO TABS
300.0000 mg | ORAL_TABLET | Freq: Every day | ORAL | Status: DC
  Administered 2020-03-23 – 2020-03-24 (×2): 300 mg via ORAL
  Filled 2020-03-22 (×3): qty 2

## 2020-03-22 MED ORDER — MORPHINE SULFATE (PF) 4 MG/ML IV SOLN: 4.0000 mg | INTRAVENOUS | Status: DC | PRN

## 2020-03-22 MED ORDER — INSULIN ASPART 100 UNIT/ML ~~LOC~~ SOLN
0.0000 [IU] | Freq: Three times a day (TID) | SUBCUTANEOUS | Status: DC
  Administered 2020-03-23: 8 [IU] via SUBCUTANEOUS
  Administered 2020-03-23: 5 [IU] via SUBCUTANEOUS
  Administered 2020-03-23: 8 [IU] via SUBCUTANEOUS
  Administered 2020-03-24: 11 [IU] via SUBCUTANEOUS
  Administered 2020-03-24: 3 [IU] via SUBCUTANEOUS
  Filled 2020-03-22 (×5): qty 1

## 2020-03-22 MED ORDER — MONTELUKAST SODIUM 10 MG PO TABS
10.0000 mg | ORAL_TABLET | Freq: Every day | ORAL | Status: DC | PRN
  Filled 2020-03-22: qty 1

## 2020-03-22 MED ORDER — TRAMADOL HCL 50 MG PO TABS
50.0000 mg | ORAL_TABLET | Freq: Three times a day (TID) | ORAL | Status: DC | PRN
  Administered 2020-03-23 – 2020-03-24 (×2): 100 mg via ORAL
  Filled 2020-03-22 (×2): qty 2

## 2020-03-22 NOTE — ED Notes (Signed)
See triage note, pt reports increased back pain with tingling and numbness in left arm and hand. Has taken oxycodone PTA which relieved tingling feeling Hx 2 hip replacements Had recent MRI and was told to come to ER for numbness or tingling d/t spine injury

## 2020-03-22 NOTE — ED Provider Notes (Signed)
[facility] Emergency Department Provider Note    Event Date/Time   First MD Initiated Contact with Patient 03/22/20 1405     (approximate)  I have reviewed the triage vital signs and the nursing notes.   HISTORY  Chief Complaint Left arm tingling   HPI Claire Hansen is a 60 y.o. female below listed past medical history presents to the ER for evaluation of tingling sensation in her left arm.  This occurred this morning as a few minutes initially started in her fingers went up her arm and then back down.  States she was having severe left leg pain which she has been dealing with over the past few weeks.  States that when she is having severe pain she started shaking.  She took an oxycodone with improvement in pain and the tingling resolved also.  Is also worried that her blood pressure has been elevated but feels like it is likely secondary to the pain.  Follow-up with orthopedics supposed to have a left hip replacement done but the had MRI that reportedly shows something "abnormal "on her spine.  Denies any new numbness or tingling.  States that she does have some tingling in the left foot and lateral lower leg which has been present for several weeks now.  No falls or trauma.    Past Medical History:  Diagnosis Date  . Allergy   . Anxiety   . Asthma    pt denies having asthma  . Chronic osteoarthritis    knees  . Deviated septum   . Diabetes mellitus without complication ([place])   . Hyperlipidemia   . Hypertension   . Irregular menstrual cycle   . Lymphadenopathy   . Obesity   . Peri-menopause   . Pneumonia 2015   Family History  Problem Relation Age of Onset  . Asthma Son    Past Surgical History:  Procedure Laterality Date  . BREAST SURGERY Right    Cyst removed  . CESAREAN SECTION     x2  . CHOLECYSTECTOMY    . DILATION AND CURETTAGE OF UTERUS     Treatment of Metorrhagia and Endometrial Ablation  . TONSILLECTOMY    . TOTAL HIP  ARTHROPLASTY Right 11/05/2016   Procedure: TOTAL HIP ARTHROPLASTY ANTERIOR APPROACH;  Surgeon: Hessie Knows, MD;  Location: [facility] ORS;  Service: Orthopedics;  Laterality: Right;  . TOTAL HIP ARTHROPLASTY Left 07/01/2017   Procedure: TOTAL HIP ARTHROPLASTY ANTERIOR APPROACH;  Surgeon: Hessie Knows, MD;  Location: [facility] ORS;  Service: Orthopedics;  Laterality: Left;   Patient Active Problem List   Diagnosis Date Noted  . Leg pain, anterior, left 03/22/2020  . Primary localized osteoarthritis of left hip 07/01/2017  . Primary localized osteoarthritis of right hip 11/05/2016  . Hyperlipidemia 08/13/2014  . History of asthma 08/13/2014  . Resistant hypertension 08/13/2014  . Microalbuminuria 08/13/2014  . Morbid obesity with BMI of 45.0-49.9, adult ([place]) 08/13/2014  . Osteoarthritis of right knee 08/13/2014  . Allergic rhinitis, seasonal 08/13/2014  . Diabetes mellitus with renal manifestation ([place]) 11/06/2010      Prior to Admission medications   Medication Sig Start Date End Date Taking? Authorizing Provider  acetaminophen (TYLENOL) 500 MG tablet Take 1,000 mg by mouth every 8 (eight) hours as needed for mild pain or headache. Reported on 05/15/2015 05/29/13   [provider]  hydrALAZINE (APRESOLINE) 10 MG tablet Take 10 mg by mouth daily.    [provider]  HYDROcodone-acetaminophen (NORCO/VICODIN) 5-325 MG tablet  Take 1 tablet by mouth every 8 (eight) hours as needed (pain). 03/04/20   [provider]  hydrocortisone cream 0.5 % Apply 1 application topically daily as needed for itching (Rash).    [provider]  meloxicam (MOBIC) 15 MG tablet Take 15 mg by mouth daily as needed for pain.    [provider]  metFORMIN (GLUCOPHAGE-XR) 500 MG 24 hr tablet TAKE 1 TABLET BY MOUTH  DAILY WITH BREAKFAST Patient taking differently: Take 500 mg by mouth 2 (two) times daily. 04/10/16   Coral Spikes, DO  metoprolol succinate (TOPROL-XL) 25 MG 24 hr tablet  Take 25 mg by mouth daily.    [provider]  montelukast (SINGULAIR) 10 MG tablet Take 1 tablet (10 mg total) by mouth daily. Patient taking differently: Take 10 mg by mouth daily as needed (allergies). 02/21/16   Coral Spikes, DO  Olmesartan-Amlodipine-HCTZ 40-10-25 MG TABS Take 1 tablet by mouth daily. 02/21/16   Cook, Barnie Del, DO  Semaglutide (RYBELSUS) 7 MG TABS Take 7 mg by mouth daily.    [provider]  spironolactone (ALDACTONE) 25 MG tablet Take 25 mg by mouth daily. 02/16/20   [provider]  traMADol (ULTRAM) 50 MG tablet Take 1-2 tablets (50-100 mg total) by mouth every 4 (four) hours as needed. Patient taking differently: Take 50-100 mg by mouth 3 (three) times daily as needed for moderate pain or severe pain (severe pain). 11/07/16   Watt Climes, PA  Vitamin D, Ergocalciferol, (DRISDOL) 1.25 MG (50000 UNIT) CAPS capsule Take 50,000 Units by mouth every Monday. 01/18/20   [provider]    Allergies Shellfish allergy    Social History Social History   Tobacco Use  . Smoking status: Never Smoker  . Smokeless tobacco: Never Used  Vaping Use  . Vaping Use: Never used  Substance Use Topics  . Alcohol use: No    Alcohol/week: 0.0 standard drinks  . Drug use: No    Review of Systems Patient denies headaches, rhinorrhea, blurry vision, numbness, shortness of breath, chest pain, edema, cough, abdominal pain, nausea, vomiting, diarrhea, dysuria, fevers, rashes or hallucinations unless otherwise stated above in HPI. ____________________________________________   PHYSICAL EXAM:  VITAL SIGNS: Vitals:   03/22/20 1913 03/22/20 1930  BP: (!) 179/96 (!) 174/105  Pulse: 83 81  Resp: 16   Temp:    SpO2: 96% 95%    Constitutional: Alert and oriented.  Eyes: Conjunctivae are normal.  Head: Atraumatic. Nose: No congestion/rhinnorhea. Mouth/Throat: Mucous membranes are moist.   Neck: No stridor. Painless ROM.  Cardiovascular: Normal  rate, regular rhythm. Grossly normal heart sounds.  Good peripheral circulation. Respiratory: Normal respiratory effort.  No retractions. Lungs CTAB. Gastrointestinal: Soft and nontender. No distention. No abdominal bruits. No CVA tenderness. Genitourinary:  Musculoskeletal: No lower extremity tenderness nor edema.  No joint effusions. Neurologic:  Normal speech and language. No gross focal neurologic deficits are appreciated. No facial droop.  Able to raise left leg and ambulate,  Weakness with dorsiflexion of lle, sensation decreased to light touch or foot and left lateral leg Skin:  Skin is warm, dry and intact. No rash noted. Psychiatric: Mood and affect are normal. Speech and behavior are normal.  ____________________________________________   LABS (all labs ordered are listed, but only abnormal results are displayed)  Results for orders placed or performed during the hospital encounter of 03/22/20 (from the past 24 hour(s))  CBC with Differential     Status: None  Collection Time: 03/22/20  2:21 PM  Result Value Ref Range   WBC 10.4 4.0 - 10.5 K/uL   RBC 4.96 3.87 - 5.11 MIL/uL   Hemoglobin 14.3 12.0 - 15.0 g/dL   HCT 41.3 36.0 - 46.0 %   MCV 83.3 80.0 - 100.0 fL   MCH 28.8 26.0 - 34.0 pg   MCHC 34.6 30.0 - 36.0 g/dL   RDW 13.5 11.5 - 15.5 %   Platelets 345 150 - 400 K/uL   nRBC 0.0 0.0 - 0.2 %   Neutrophils Relative % 66 %   Neutro Abs 6.8 1.7 - 7.7 K/uL   Lymphocytes Relative 26 %   Lymphs Abs 2.7 0.7 - 4.0 K/uL   Monocytes Relative 6 %   Monocytes Absolute 0.6 0.1 - 1.0 K/uL   Eosinophils Relative 2 %   Eosinophils Absolute 0.2 0.0 - 0.5 K/uL   Basophils Relative 0 %   Basophils Absolute 0.0 0.0 - 0.1 K/uL   Immature Granulocytes 0 %   Abs Immature Granulocytes 0.03 0.00 - 0.07 K/uL  Comprehensive metabolic panel     Status: Abnormal   Collection Time: 03/22/20  2:21 PM  Result Value Ref Range   Sodium 134 (L) 135 - 145 mmol/L   Potassium 4.0 3.5 - 5.1 mmol/L    Chloride 99 98 - 111 mmol/L   CO2 23 22 - 32 mmol/L   Glucose, Bld 200 (H) 70 - 99 mg/dL   BUN 14 6 - 20 mg/dL   Creatinine, Ser 0.94 0.44 - 1.00 mg/dL   Calcium 10.3 8.9 - 10.3 mg/dL   Total Protein 8.3 (H) 6.5 - 8.1 g/dL   Albumin 4.5 3.5 - 5.0 g/dL   AST 24 15 - 41 U/L   ALT 21 0 - 44 U/L   Alkaline Phosphatase 79 38 - 126 U/L   Total Bilirubin 0.8 0.3 - 1.2 mg/dL   GFR, Estimated >60 >60 mL/min   Anion gap 12 5 - 15  Troponin I (High Sensitivity)     Status: Abnormal   Collection Time: 03/22/20  2:21 PM  Result Value Ref Range   Troponin I (High Sensitivity) 33 (H) <18 ng/L  Troponin I (High Sensitivity)     Status: Abnormal   Collection Time: 03/22/20  4:28 PM  Result Value Ref Range   Troponin I (High Sensitivity) 35 (H) <18 ng/L  Troponin I (High Sensitivity)     Status: Abnormal   Collection Time: 03/22/20  7:37 PM  Result Value Ref Range   Troponin I (High Sensitivity) 36 (H) <18 ng/L   ____________________________________________  EKG My review and personal interpretation at Time: 14:30   Indication: arm tingling  Rate: 90  Rhythm: sinus Axis: normal Other: no stemi, no depressions, borderline prolonged qt ____________________________________________  RADIOLOGY  I personally reviewed all radiographic images ordered to evaluate for the above acute complaints and reviewed radiology reports and findings.  These findings were personally discussed with the patient.  Please see medical record for radiology report.  ____________________________________________   PROCEDURES  Procedure(s) performed:  Procedures    Critical Care performed: no ____________________________________________   INITIAL IMPRESSION / ASSESSMENT AND PLAN / ED COURSE  Pertinent labs & imaging results that were available during my care of the patient were reviewed by me and considered in my medical decision making (see chart for details).   DDX: Fracture, contusion, musculoskeletal  strain, ACS, hypertensive urgency, sciatica  TRIANA COOVER is a 60 y.o. who presents  to the ED with Claire Hansen as described above.  Patient with vague symptoms complains of tingling in her left arm finger.  No neck pain.  CT head ordered out of triage is negative.  Patient is hypertensive but also complaining of pain related to her low back and chronic left hip pain that appears consistent with sciatica.  Has a left foot drop that is been present for the past several weeks with recent MRI which was without clear explanation of her symptoms.  Presentation is not consistent with cauda equina.  Her initial troponin is mildly elevated will continue to observe.  The patient will be placed on continuous pulse oximetry and telemetry for monitoring.  Laboratory evaluation will be sent to evaluate for the above complaints.     Clinical Course as of 03/22/20 2043  Fri Mar 22, 2020  1512 Patient's troponin level is mildly elevated.  EKG nonischemic.  Will order repeat troponin. [PR]  1950 Patient still having pain still hypertensive.  Appears uncomfortable given these multiple symptoms mildly elevated troponin persistent hypertension despite her home medications multiple doses of IV narcotic medication having discomfort will discuss with hospitalist for admission.  Given the tingling in her arm and hypertension I am can order MRI to rule out CVA.  Will discuss with hospitalist for observation. [PR]    Clinical Course User Index [PR] Merlyn Lot, MD    The patient was evaluated in Emergency Department today for the symptoms described in the history of present illness. He/she was evaluated in the context of the global COVID-19 pandemic, which necessitated consideration that the patient might be at risk for infection with the SARS-CoV-2 virus that causes COVID-19. Institutional protocols and algorithms that pertain to the evaluation of patients at risk for COVID-19 are in a state of rapid change based on  information released by regulatory bodies including the CDC and federal and state organizations. These policies and algorithms were followed during the patient's care in the ED.  As part of my medical decision making, I reviewed the following data within the [facility] notes reviewed and incorporated, Labs reviewed, notes from prior ED visits and [place] Controlled Substance Database   ____________________________________________   FINAL CLINICAL IMPRESSION(S) / ED DIAGNOSES  Final diagnoses:  Hypertensive urgency      NEW MEDICATIONS STARTED DURING THIS VISIT:  New Prescriptions   No medications on file     Note:  This document was prepared using Dragon voice recognition software and may include unintentional dictation errors.    Merlyn Lot, MD 03/22/20 2043

## 2020-03-22 NOTE — ED Notes (Signed)
Pt states the main reason she is here is for pain control.

## 2020-03-22 NOTE — ED Notes (Signed)
First Nurse Note: Pt to ED via POV, pt states that she is having tingling in her left arm and pain. Pt states that she was supposed to have surgery on Tuesday but it was cancelled due to them finding a spot on her spine, pt was instructed to come in immediately if she started having numbness in her arm.

## 2020-03-22 NOTE — Progress Notes (Signed)
PHARMACIST - PHYSICIAN COMMUNICATION  CONCERNING:  Enoxaparin (Lovenox) for DVT Prophylaxis    RECOMMENDATION: Patient was prescribed enoxaprin 40mg  q24 hours for VTE prophylaxis.   Filed Weights   03/22/20 1206  Weight: 111.1 kg (245 lb)    Body mass index is 40.77 kg/m.  Estimated Creatinine Clearance: 81.9 mL/min (by C-G formula based on SCr of 0.94 mg/dL).   Based on [facility] policy patient is candidate for enoxaparin 0.5mg /kg TBW SQ every 24 hours based on BMI being >30.  DESCRIPTION: Pharmacy has adjusted enoxaparin dose per [facility] policy.  Patient is now receiving enoxaparin 0.5mg /kg mg every 24 hours   Otelia Sergeant, PharmD, [facility] 03/22/2020 9:48 PM

## 2020-03-22 NOTE — ED Triage Notes (Signed)
Pt is to have left hip surgery, has been in a lot of pain, also having issues with her spine requiring surgery as well, this am around 0900, pt began feeling tingling in her left hand, traveled up her left arm and now only having tingling in left hand. Is on oxy for pain and took one before arriving today. States her pain is almost unbearable. 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 Speech clear. Grips equal bilaterally, no arm drift.

## 2020-03-22 NOTE — ED Notes (Signed)
Discussed pt complaints with Dr Quentin Cornwall, will order head ct, then have her see an ED physician to address pain control, discussed plan with pt. Pt appreciative.

## 2020-03-22 NOTE — H&P (Signed)
History and Physical   Claire Hansen LZJ:[id number] DOB: September 02, 1960 DOA: 03/22/2020  PCP: Perrin Maltese, MD  Outpatient Specialists: Dr. Rudene Christians, orthopedic  Patient coming from: home   I have personally briefly reviewed patient's old medical records in [place].  Chief Concern: left hip pain  HPI: Claire Hansen is a 60 y.o. female with medical history significant for obesity, hypertension, non-insulin-dependent diabetes mellitus, presents to the emergency department for chief concerns of worsening left leg pain.  She reports the left leg pain started about October/November 2021, initially the pain was intermittent and worse with weightbearing and has now progressed to constant pain.  She reports the pain is 10/10 at its peak and now is an 8/10.   She states that in September 2021 she started having abdominal pain and was diagnosed with large uterine fibroid.  After this she developed left sided hip pain.  She denies fever, chest pain, shortness of breath, nausea, vomiting. She endorses tingling and sensation down her left arm and leg, which started today, prompting her to present to the emergency department for further evaluation  At bedside, patient is able to flex and extend her left hip minimally she states that this causes her pain.  She was able to wiggle her left toes for me.  Social history: she lives with husband and son. She denies tobacco, etoh, recreational drug use.   Vaccine: no vaccinations for COVID  Note: Dr. Linward Headland had her scheduled for surgery of the left femur 03/26/20, however the due to concerns for foot drop, and recent lumbar MRI, orthopedic surgery has been deferred pending neurosurgery evaluation.   Due to the numbness and initial thought of foot drop, Dr. Rudene Christians sent her to Dr. Lacinda Axon, neurosurgery, and she has an appointment on 03/26/20 at 11:30 a.m for further evaluation.  ROS: Constitutional: no weight change, no fever ENT/Mouth: no sore  throat, no rhinorrhea Eyes: no eye pain, no vision changes Cardiovascular: no chest pain, no dyspnea,  no edema, no palpitations Respiratory: no cough, no sputum, no wheezing Gastrointestinal: no nausea, no vomiting, no diarrhea, no constipation Genitourinary: no urinary incontinence, no dysuria, no hematuria Musculoskeletal: no arthralgias, no myalgias Skin: no skin lesions, no pruritus, Neuro: + weakness, no loss of consciousness, no syncope, + numbness Psych: no anxiety, no depression, + decrease appetite Heme/Lymph: no bruising, no bleeding  ED Course: Discussed with ED provider, patient required hospitalization due to pain control. Initial vitals in the emergency department was afebrile temperature of 98.6, respiration rate of 16, heart rate of 83, blood pressure initially 179/96, satting at 96% on room air.  CT of the head was done and was negative for acute abnormalities.  MRI of the head is pending.  Assessment/Plan  Active Problems:   Hyperlipidemia   History of asthma   Resistant hypertension   Morbid obesity with BMI of 45.0-49.9, adult (HCC)   Osteoarthritis of right knee   Primary localized osteoarthritis of left hip   Leg pain, anterior, left   Lateral left upper leg pain-etiology unclear -Hip and femoral x-ray ordered -Pain control with morphine 2 mg every 2 hours IV -Resumed home tramadol -If patient continues to have pain and MRI of the brain is negative patient will benefit from orthopedic inpatient evaluation  Numbness and tingling of the left side of her body -CT of the head was negative -MRI of the head is pending  Blood pressure elevated-suspect secondary to pain -Status post labetalol 5 mg IV per EDP -  Resumed home hydralazine 10 mg daily, metoprolol succinate 25 mg daily, spironolactone 25 mg daily, olmesartan-amlodipine-hydrochlorothiazide 40-10-25 daily  Non-insulin-dependent diabetes mellitus Hyperglycemia -resumed home Metformin 500 mg twice  daily -Patient does not take Rybelsus at this time -Insulin sliding scale, with bedtime coverage  Chart reviewed.   DVT prophylaxis: Enoxaparin subcutaneous every 24 hours Code Status: Full code Diet: Heart healthy/carb modified Family Communication: Updated spouse at bedside Disposition Plan: Pending clinical course Consults called: none at this time Admission status: Observation to MedSurg with telemetry  Past Medical History:  Diagnosis Date   Allergy    Anxiety    Asthma    pt denies having asthma   Chronic osteoarthritis    knees   Deviated septum    Diabetes mellitus without complication ([place])    Hyperlipidemia    Hypertension    Irregular menstrual cycle    Lymphadenopathy    Obesity    Peri-menopause    Pneumonia 2015   Past Surgical History:  Procedure Laterality Date   BREAST SURGERY Right    Cyst removed   CESAREAN SECTION     x2   CHOLECYSTECTOMY     DILATION AND CURETTAGE OF UTERUS     Treatment of Metorrhagia and Endometrial Ablation   TONSILLECTOMY     TOTAL HIP ARTHROPLASTY Right 11/05/2016   Procedure: TOTAL HIP ARTHROPLASTY ANTERIOR APPROACH;  Surgeon: Hessie Knows, MD;  Location: [facility] ORS;  Service: Orthopedics;  Laterality: Right;   TOTAL HIP ARTHROPLASTY Left 07/01/2017   Procedure: TOTAL HIP ARTHROPLASTY ANTERIOR APPROACH;  Surgeon: Hessie Knows, MD;  Location: [facility] ORS;  Service: Orthopedics;  Laterality: Left;   Social History:  reports that she has never smoked. She has never used smokeless tobacco. She reports that she does not drink alcohol and does not use drugs.  Allergies  Allergen Reactions   Shellfish Allergy Anaphylaxis and Nausea And Vomiting    Topical betadine is OK to use.   Family History  Problem Relation Age of Onset   Asthma Son    Family history: Family history reviewed and not pertinent  Prior to Admission medications   Medication Sig Start Date End Date Taking? Authorizing Provider   acetaminophen (TYLENOL) 500 MG tablet Take 1,000 mg by mouth every 8 (eight) hours as needed for mild pain or headache. Reported on 05/15/2015 05/29/13   [provider]  hydrALAZINE (APRESOLINE) 10 MG tablet Take 10 mg by mouth daily.    [provider]  HYDROcodone-acetaminophen (NORCO/VICODIN) 5-325 MG tablet Take 1 tablet by mouth every 8 (eight) hours as needed (pain). 03/04/20   [provider]  hydrocortisone cream 0.5 % Apply 1 application topically daily as needed for itching (Rash).    [provider]  meloxicam (MOBIC) 15 MG tablet Take 15 mg by mouth daily as needed for pain.    [provider]  metFORMIN (GLUCOPHAGE-XR) 500 MG 24 hr tablet TAKE 1 TABLET BY MOUTH  DAILY WITH BREAKFAST Patient taking differently: Take 500 mg by mouth 2 (two) times daily. 04/10/16   Coral Spikes, DO  metoprolol succinate (TOPROL-XL) 25 MG 24 hr tablet Take 25 mg by mouth daily.    [provider]  montelukast (SINGULAIR) 10 MG tablet Take 1 tablet (10 mg total) by mouth daily. Patient taking differently: Take 10 mg by mouth daily as needed (allergies). 02/21/16   Coral Spikes, DO  Olmesartan-Amlodipine-HCTZ 40-10-25 MG TABS Take 1 tablet by mouth daily. 02/21/16   Coral Spikes, DO  Semaglutide (RYBELSUS) 7 MG TABS Take 7 mg by mouth daily.    [provider]  spironolactone (ALDACTONE) 25 MG tablet Take 25 mg by mouth daily. 02/16/20   [provider]  traMADol (ULTRAM) 50 MG tablet Take 1-2 tablets (50-100 mg total) by mouth every 4 (four) hours as needed. Patient taking differently: Take 50-100 mg by mouth 3 (three) times daily as needed for moderate pain or severe pain (severe pain). 11/07/16   Watt Climes, PA  Vitamin D, Ergocalciferol, (DRISDOL) 1.25 MG (50000 UNIT) CAPS capsule Take 50,000 Units by mouth every Monday. 01/18/20   [provider]   Physical Exam: Vitals:   03/22/20 1628 03/22/20 1654 03/22/20 1913 03/22/20  1930  BP: (!) 157/98 (!) 174/101 (!) 179/96 (!) 174/105  Pulse: 80  83 81  Resp: 16  16   Temp:      TempSrc:      SpO2: 99%  96% 95%  Weight:      Height:       Constitutional: appears age-appropriate, NAD, calm, comfortable Eyes: PERRL, lids and conjunctivae normal ENMT: Mucous membranes are moist. Posterior pharynx clear of any exudate or lesions. Age-appropriate dentition. Hearing appropriate/loss Neck: normal, supple, no masses, no thyromegaly Respiratory: clear to auscultation bilaterally, no wheezing, no crackles. Normal respiratory effort. No accessory muscle use.  Cardiovascular: Regular rate and rhythm, no murmurs / rubs / gallops. No extremity edema. 2+ pedal pulses. No carotid bruits.  Abdomen: Obese abdomen, no tenderness, no masses palpated, no hepatosplenomegaly. Bowel sounds positive.  Musculoskeletal: no clubbing / cyanosis. No joint deformity upper and lower extremities. Good ROM, no contractures, no atrophy. Normal muscle tone.  Skin: no rashes, lesions, ulcers. No induration Neurologic: Sensation intact. Strength 5/5 in all 4.  Psychiatric: Normal judgment and insight. Alert and oriented x 3. Normal mood.   EKG: independently reviewed, showing normal sinus rhythm with rate of 92, QTc 489  Chest x-ray on Admission: I personally reviewed and I agree with radiologist reading as below.  CT Head Wo Contrast  Result Date: 03/22/2020 CLINICAL DATA:  Transient ischemic attack. EXAM: CT HEAD WITHOUT CONTRAST TECHNIQUE: Contiguous axial images were obtained from the base of the skull through the vertex without intravenous contrast. COMPARISON:  February 17, 2019. FINDINGS: Brain: No evidence of acute infarction, hemorrhage, hydrocephalus, extra-axial collection or mass lesion/mass effect. Vascular: No hyperdense vessel or unexpected calcification. Skull: Normal. Negative for fracture or focal lesion. Sinuses/Orbits: No acute finding. Other: None. IMPRESSION: Normal head CT.  Electronically Signed   By: Marijo Conception M.D.   On: 03/22/2020 13:39   DG Chest Portable 1 View  Result Date: 03/22/2020 CLINICAL DATA:  Left arm pain. EXAM: PORTABLE CHEST 1 VIEW COMPARISON:  February 16, 2009. FINDINGS: Stable cardiomegaly. No pneumothorax or pleural effusion is noted. Both lungs are clear. The visualized skeletal structures are unremarkable. IMPRESSION: No active disease. Electronically Signed   By: Marijo Conception M.D.   On: 03/22/2020 15:56   Labs on Admission: I have personally reviewed following labs  CBC: Recent Labs  Lab 03/22/20 1421  WBC 10.4  NEUTROABS 6.8  HGB 14.3  HCT 41.3  MCV 83.3  PLT 938   Basic Metabolic Panel: Recent Labs  Lab 03/22/20 1421  NA 134*  K 4.0  CL 99  CO2 23  GLUCOSE 200*  BUN 14  CREATININE 0.94  CALCIUM 10.3   GFR: Estimated Creatinine Clearance: 81.9 mL/min (by C-G formula based on SCr of 0.94  mg/dL).  Liver Function Tests: Recent Labs  Lab 03/22/20 1421  AST 24  ALT 21  ALKPHOS 79  BILITOT 0.8  PROT 8.3*  ALBUMIN 4.5   Urine analysis:    Component Value Date/Time   COLORURINE YELLOW (A) 06/23/2017 1349   APPEARANCEUR CLEAR (A) 06/23/2017 1349   LABSPEC 1.025 06/23/2017 1349   PHURINE 5.0 06/23/2017 1349   GLUCOSEU NEGATIVE 06/23/2017 1349   HGBUR NEGATIVE 06/23/2017 1349   BILIRUBINUR NEGATIVE 06/23/2017 1349   BILIRUBINUR neg 08/14/2014 0842   KETONESUR NEGATIVE 06/23/2017 1349   PROTEINUR 100 (A) 06/23/2017 1349   UROBILINOGEN 0.2 08/14/2014 0842   NITRITE NEGATIVE 06/23/2017 1349   LEUKOCYTESUR NEGATIVE 06/23/2017 1349   Tala Eber N Mancel Lardizabal D.O. [facility]  If 7PM-7AM, please contact overnight-coverage provider If 7AM-7PM, please contact day coverage provider [email or web address]  03/22/2020, 8:53 PM

## 2020-03-22 NOTE — ED Notes (Signed)
Patient resting comfortably in stretcher, husband bedside.

## 2020-03-23 ENCOUNTER — Other Ambulatory Visit: Payer: Self-pay

## 2020-03-23 DIAGNOSIS — M21372 Foot drop, left foot: Secondary | ICD-10-CM

## 2020-03-23 DIAGNOSIS — M79605 Pain in left leg: Secondary | ICD-10-CM | POA: Diagnosis not present

## 2020-03-23 LAB — GLUCOSE, CAPILLARY
Glucose-Capillary: 279 mg/dL — ABNORMAL HIGH (ref 70–99)
Glucose-Capillary: 211 mg/dL — ABNORMAL HIGH (ref 70–99)
Glucose-Capillary: 196 mg/dL — ABNORMAL HIGH (ref 70–99)
Glucose-Capillary: 259 mg/dL — ABNORMAL HIGH (ref 70–99)
Glucose-Capillary: 236 mg/dL — ABNORMAL HIGH (ref 70–99)

## 2020-03-23 LAB — CBC
HCT: 40.3 % (ref 36.0–46.0)
Hemoglobin: 14 g/dL (ref 12.0–15.0)
MCH: 28.9 pg (ref 26.0–34.0)
MCHC: 34.7 g/dL (ref 30.0–36.0)
MCV: 83.3 fL (ref 80.0–100.0)
Platelets: 369 10*3/uL (ref 150–400)
RBC: 4.84 MIL/uL (ref 3.87–5.11)
RDW: 13.6 % (ref 11.5–15.5)
WBC: 10.4 10*3/uL (ref 4.0–10.5)
nRBC: 0 % (ref 0.0–0.2)

## 2020-03-23 LAB — HEMOGLOBIN A1C
Hgb A1c MFr Bld: 9.3 % — ABNORMAL HIGH (ref 4.8–5.6)
Mean Plasma Glucose: 220.21 mg/dL

## 2020-03-23 LAB — BASIC METABOLIC PANEL
Anion gap: 13 (ref 5–15)
BUN: 16 mg/dL (ref 6–20)
CO2: 26 mmol/L (ref 22–32)
Calcium: 10.4 mg/dL — ABNORMAL HIGH (ref 8.9–10.3)
Chloride: 99 mmol/L (ref 98–111)
Creatinine, Ser: 1.02 mg/dL — ABNORMAL HIGH (ref 0.44–1.00)
GFR, Estimated: >60 mL/min (ref >60)
Glucose, Bld: 279 mg/dL — ABNORMAL HIGH (ref 70–99)
Potassium: 3.6 mmol/L (ref 3.5–5.1)
Sodium: 138 mmol/L (ref 135–145)

## 2020-03-23 LAB — HIV ANTIBODY (ROUTINE TESTING W REFLEX): HIV Screen 4th Generation wRfx: NONREACTIVE

## 2020-03-23 LAB — SARS CORONAVIRUS 2 (TAT 6-24 HRS): SARS Coronavirus 2: NEGATIVE

## 2020-03-23 MED ORDER — DOCUSATE SODIUM 100 MG PO CAPS
100.0000 mg | ORAL_CAPSULE | Freq: Two times a day (BID) | ORAL | Status: DC
  Administered 2020-03-23: 100 mg via ORAL
  Filled 2020-03-23 (×2): qty 1

## 2020-03-23 MED ORDER — SENNA 8.6 MG PO TABS
1.0000 | ORAL_TABLET | Freq: Every day | ORAL | Status: DC
  Administered 2020-03-23: 8.6 mg via ORAL
  Filled 2020-03-23: qty 1

## 2020-03-23 NOTE — Consult Note (Signed)
NEURO HOSPITALIST CONSULT NOTE   Requestig physician: Dr. Donalee Citrin  Reason for Consult: Left lower extremity pain, weakness and numbness   History obtained from:  Patient, Husband and Chart    HPI:                                                                                                                                          Claire Hansen is an 60 y.o. female with a PMHx of anxiety, left sided Bell's palsy, DM, HLD, HTN, obesity, recent acute left-sided low back pain with left-sided sciatic and left total hip arthroplasty who presented to the ED yesterday with pain and tingling in her left arm. She describes the tingling as involving her fingers initially, then radiating up her arm. The arm tingling resolved in the ED but there continued to be tingling in her left hand. She also was experiencing severe LUE pain, which she felt was almost unbearable. She stated in the ED that the main reason for presenting was for pain control.   Head CT was normal. MRI brain was normal except for marrow signal changes suggestive of hypercellular marrow due to anemia.   She also had a recent MRI of her lumbar spine on Feb 22 with the following findings: - Lumbar spondylosis superimposed upon a congenitally narrow lumbar spinal canal - At L4-L5, there is trace anterolisthesis. Disc uncovering with disc bulge and endplate spurring. Moderate facet arthrosis with ligamentum flavum hypertrophy. Small bilateral facet joint effusions. Bilateral subarticular narrowing with potential to affect either descending L5 nerve root. Moderately severe central canal stenosis. Moderate bilateral neural foraminal narrowing (greater on the left). - At L2-L3, there is multifactorial mild bilateral subarticular narrowing without appreciable nerve root impingement. Mild bilateral neural foraminal narrowing. Small right facet joint effusion. - At L3-L4, there is multifactorial mild bilateral  subarticular and central canal narrowing without appreciable nerve root impingement. Bilateral neural foraminal narrowing (mild right, moderate left). Small bilateral facet joint effusions. - At L5-S1, there is multifactorial mild left subarticular narrowing without appreciable nerve root impingement. Bilateral neural foraminal narrowing (moderate right, mild/moderate left). Small left facet joint effusion. - Partially imaged fibroid uterus.  In the ED, the patient stated that she was having severe left leg pain for the past few weeks; the pain would get so severe that she would start to shake. She endorses elevated BP due to the pain as well. She feels that the pain is so severe that she is at risk from oversedation from taking too much oxycodone. She was to follow-up with orthopedics and states that she is supposed to have a revision of her left hip replacement done but that her MRI had reportedly shown something "abnormal " on her spine. She denies any new numbness or tingling in  her leg. She has had chronic left foot drop as well as weakness of hip flexion. She drags her left foot when walking with her walker. She has had shooting pain originating in her lower back, traveling down the posterolateral aspect of her left buttock along the lateral aspect of her left thigh, sparing the calf, then involving the lateral aspect of her left foot sparing the toes. Left leg pain can be as severe as 10/10. The left leg pain started about October/November 2021, initially the pain was intermittent and worse with weightbearing and has now progressed to constant pain.  Her Orthopaedist, Dr. Rudene Christians, initally had her scheduled for surgery of the left femur 03/26/20, however the due to concerns regarding her left foot drop and recent lumbar MRI, Orthopaedic surgery has been deferred pending Neurosurgery evaluation. Appointment with Dr. Lacinda Axon, of Neurosurgery, had been scheduled for 03/26/20.   Past Medical History:   Diagnosis Date  . Allergy   . Anxiety   . Asthma    pt denies having asthma  . Chronic osteoarthritis    knees  . Deviated septum   . Diabetes mellitus without complication ([place])   . Hyperlipidemia   . Hypertension   . Irregular menstrual cycle   . Lymphadenopathy   . Obesity   . Peri-menopause   . Pneumonia 2015    Past Surgical History:  Procedure Laterality Date  . BREAST SURGERY Right    Cyst removed  . CESAREAN SECTION     x2  . CHOLECYSTECTOMY    . DILATION AND CURETTAGE OF UTERUS     Treatment of Metorrhagia and Endometrial Ablation  . TONSILLECTOMY    . TOTAL HIP ARTHROPLASTY Right 11/05/2016   Procedure: TOTAL HIP ARTHROPLASTY ANTERIOR APPROACH;  Surgeon: Hessie Knows, MD;  Location: [facility] ORS;  Service: Orthopedics;  Laterality: Right;  . TOTAL HIP ARTHROPLASTY Left 07/01/2017   Procedure: TOTAL HIP ARTHROPLASTY ANTERIOR APPROACH;  Surgeon: Hessie Knows, MD;  Location: [facility] ORS;  Service: Orthopedics;  Laterality: Left;    Family History  Problem Relation Age of Onset  . Asthma Son               Social History:  reports that she has never smoked. She has never used smokeless tobacco. She reports that she does not drink alcohol and does not use drugs.  Allergies  Allergen Reactions  . Shellfish Allergy Anaphylaxis and Nausea And Vomiting    Topical betadine is OK to use.    MEDICATIONS:                                                                                                                     Scheduled: . irbesartan  300 mg Oral Daily   And  . amLODipine  10 mg Oral Daily   And  . hydrochlorothiazide  25 mg Oral Daily  . enoxaparin (LOVENOX) injection  0.5 mg/kg Subcutaneous Q24H  . hydrALAZINE  10 mg Oral Daily  . insulin aspart  0-15 Units Subcutaneous  TID WC  . insulin aspart  0-5 Units Subcutaneous QHS  . metFORMIN  500 mg Oral BID WC  . metoprolol succinate  25 mg Oral Daily  . spironolactone  25 mg Oral Daily  . [START ON 03/25/2020]  Vitamin D (Ergocalciferol)  50,000 Units Oral Q Mon     ROS:                                                                                                                                       As per HPI. Does not endorse any facial droop, central vision loss, hemianopsia, language difficulty, confusion, facial numbness, right sided numbness or right sided weakness.   Blood pressure (!) 159/96, pulse 84, temperature 97.7 F (36.5 C), resp. rate 18, height 5\' 5"  (1.651 m), weight 111.1 kg, last menstrual period 06/04/2016, SpO2 98 %.   General Examination:                                                                                                       Physical Exam  HEENT-  [place]/AT    Lungs-Respirations unlaboree Extremities- No edema   Neurological Examination Mental Status: Awake and alert. Fully oriented. Speech fluent without evidence of aphasia.  Able to follow all commands without difficulty. Pleasant and cooperative.  Cranial Nerves: II: Visual fields intact. No extinction to DSS. PERRL.   III,IV, VI: No ptosis. EOMI. No nystagmus.  V,VII: Facial temp sensation equal bilaterally. Smile symmetric. VIII: Hearing intact to conversation IX,X: No hypophonia XI: Symmetric XII: Midline tongue extension Motor: BUE 5/5 proximally and distally with normal tone and bulk. No pronator drift.  RLE: 5/5 HF, KE, KF, APF and ADF.  LLE: 2/5 HF, 4-/5 KF, 5/5 KE, 4-/5 APF, 2/5 ADF Sensory: FT and temp sensation normal to BUE and RLE.  LLE with decreased FT to left lateral thigh as well as dysesthesia and allodynia in this distribution. FT spared to calf, but decreased to dorsal and plantar aspects of foot. Temp sensation to LLE decreased to lateral aspect of thigh, normal to leg between knee and ankle, decreased to dorsal aspect of foot.  Deep Tendon Reflexes:  1+ bilateral brachioradialis and biceps.  1+ right patellar, 0 left patellar.  1+ right achilles, 0 left achilles Plantars:  Right: downgoing  Left: downgoing Cerebellar: No ataxia with FNF bilaterally Gait: Unable to assess   Lab Results: Basic Metabolic Panel: Recent Labs  Lab 03/22/20 1421 03/23/20 0412  NA 134* 138  K  4.0 3.6  CL 99 99  CO2 23 26  GLUCOSE 200* 279*  BUN 14 16  CREATININE 0.94 1.02*  CALCIUM 10.3 10.4*    CBC: Recent Labs  Lab 03/22/20 1421 03/23/20 0412  WBC 10.4 10.4  NEUTROABS 6.8  --   HGB 14.3 14.0  HCT 41.3 40.3  MCV 83.3 83.3  PLT 345 369    Cardiac Enzymes: No results for input(s): CKTOTAL, CKMB, CKMBINDEX, TROPONINI in the last 168 hours.  Lipid Panel: No results for input(s): CHOL, TRIG, HDL, CHOLHDL, VLDL, LDLCALC in the last 168 hours.  Imaging: CT Head Wo Contrast  Result Date: 03/22/2020 CLINICAL DATA:  Transient ischemic attack. EXAM: CT HEAD WITHOUT CONTRAST TECHNIQUE: Contiguous axial images were obtained from the base of the skull through the vertex without intravenous contrast. COMPARISON:  February 17, 2019. FINDINGS: Brain: No evidence of acute infarction, hemorrhage, hydrocephalus, extra-axial collection or mass lesion/mass effect. Vascular: No hyperdense vessel or unexpected calcification. Skull: Normal. Negative for fracture or focal lesion. Sinuses/Orbits: No acute finding. Other: None. IMPRESSION: Normal head CT. Electronically Signed   By: Marijo Conception M.D.   On: 03/22/2020 13:39   MR BRAIN WO CONTRAST  Result Date: 03/22/2020 CLINICAL DATA:  Transient ischemic attack. Tingling of the left arm. EXAM: MRI HEAD WITHOUT CONTRAST TECHNIQUE: Multiplanar, multiecho pulse sequences of the brain and surrounding structures were obtained without intravenous contrast. COMPARISON:  Head CT same day FINDINGS: Brain: The brain has a normal appearance without evidence of malformation, atrophy, old or acute small or large vessel infarction, mass lesion, hemorrhage, hydrocephalus or extra-axial collection. Vascular: Major vessels at the base of the brain show  flow. Venous sinuses appear patent. Skull and upper cervical spine: Somewhat hypercellular marrow pattern of the calvarium, clivus and cervical spine, most commonly seen in the setting of anemia. Sinuses/Orbits: Clear/normal. Other: None significant. IMPRESSION: 1. Normal appearance of the brain itself. 2. Somewhat hypercellular marrow pattern of the calvarium, clivus and cervical spine, most commonly seen in the setting of anemia. Electronically Signed   By: Nelson Chimes M.D.   On: 03/22/2020 22:49   DG Pelvis Portable  Result Date: 03/22/2020 CLINICAL DATA:  Pelvic and left leg pain. EXAM: PORTABLE PELVIS 1-2 VIEWS COMPARISON:  None. FINDINGS: There is no evidence of pelvic fracture or diastasis. No pelvic bone lesions are seen. Bilateral hip prostheses are seen in expected position. IMPRESSION: Negative. Electronically Signed   By: Marlaine Hind M.D.   On: 03/22/2020 21:41   DG Chest Portable 1 View  Result Date: 03/22/2020 CLINICAL DATA:  Left arm pain. EXAM: PORTABLE CHEST 1 VIEW COMPARISON:  February 16, 2009. FINDINGS: Stable cardiomegaly. No pneumothorax or pleural effusion is noted. Both lungs are clear. The visualized skeletal structures are unremarkable. IMPRESSION: No active disease. Electronically Signed   By: Marijo Conception M.D.   On: 03/22/2020 15:56   DG FEMUR MIN 2 VIEWS LEFT  Result Date: 03/22/2020 CLINICAL DATA:  Left flank pain. EXAM: LEFT FEMUR 2 VIEWS COMPARISON:  None. FINDINGS: Bipolar left hip prosthesis is seen in appropriate position. There is no evidence of fracture or dislocation. No other bone lesion seen involving the femur. Mild degenerative spurring is seen involving the knee joint. IMPRESSION: No acute findings. Mild left knee osteoarthritis and left hip prosthesis. Electronically Signed   By: Marlaine Hind M.D.   On: 03/22/2020 21:43    Assessment: 60 year old female with chronic LLE pain, sensory numbness and weakness in a distribution most  consistent with  sciatica 1. MRI L-spine reveals moderately severe spinal canal stenosis at L4-L5 with Bilateral subarticular narrowing having potential to affect either descending L5 nerve root, but worse on the left. This may be the etiology for her left foot drop.  2. Pain in left lower lumbar region radiating down the LLE (see HPI for description) is most consistent with sciatica. This also can explain her left foot drop. Sciatic nerve irritation and compression can occur from pelvis to the distal thigh, the most common location of the compression being between the greater sciatic notch and the ischial tuberosity, but compression by the piriformis muscle (piriformis syndrome) should also be considered.  3. Her left hip flexion weakness is best explained by deconditioning in the context of her failed left hip arthroplasty.  4. MRI brain was normal except for marrow signal changes suggestive of hypercellular marrow due to anemia.  5. DM. Predisposes to a variety of neuropathies, including compressive neuropathies.   Recommendations: 1. Will defer to Neurosurgery and Orthopaedics regarding final decision for imaging to localize of the site of sciatic nerve irritation or compression. MRI of the left hip/buttock region is most likely to be the best first test for this.  2. PT 3. Pain management.  4. Per Orthopaedics consult note from today, the patient has a follow-up appointment with Dr. Lacinda Axon with neurosurgery on 03/26/2020. If the patient has a more prolonged inpatient stay, Orthopaedics has recommended obtaining an AFO and possibly EMG/NCS of the lower extremity as an outpatient.     Electronically signed: Dr. Kerney Elbe 03/23/2020, 7:42 PM

## 2020-03-23 NOTE — Progress Notes (Signed)
PROGRESS NOTE    Claire Hansen  KVQ:[id number] DOB: 05-23-1960 DOA: 03/22/2020 PCP: Perrin Maltese, MD   Brief Narrative:  This 60 years old morbidly obese female with PMH significant for hypertension, non-insulin-dependent diabetes mellitus presents in the emergency department with complaints of worsening left leg pain.  Patient reports having left leg pain for last few months which was initially intermittent and was getting worse with weightbearing but now has progressed to constant pain.  She reports tingling and sensation down her left leg and left arm which started recently caused her to present in the emergency department for further evaluation.  Patient reports having left hip replacement.  She was scheduled to have left femur surgery in March however due to the concerns of foot drop and recent lumbar MRI orthopedic surgery has deferred surgery pending neurosurgery evaluation. Orthopedics, Neurology has been consulted for further evaluation.  Assessment & Plan:   Active Problems:   Hyperlipidemia   History of asthma   Resistant hypertension   Morbid obesity with BMI of 45.0-49.9, adult (HCC)   Osteoarthritis of right knee   Primary localized osteoarthritis of left hip   Leg pain, anterior, left   Lateral left upper leg pain-etiology unclear -Hip and femoral x-ray : Mild left knee osteoarthritis and left hip prosthesis. -Adequate pain control with morphine 2 mg every 2 hours IV -Resumed home tramadol -Patient continues to have pain and MRI of the brain is negative. - Orthopaedics consulted , awaiting recommendation.  Numbness and tingling of the left side of her body -CT of the head was negative -MRI of the head is unremarkable. -Neurology consulted,  awaiting recommendation.  HTN - BP elevated, suspect secondary to pain. -Status post labetalol 5 mg IV per EDP -Resumed home hydralazine 10 mg daily, metoprolol succinate 25 mg daily, spironolactone 25 mg daily,  olmesartan-amlodipine-hydrochlorothiazide 40-10-25 daily. - Moniter BP   Non-insulin-dependent diabetes mellitus -resumed home Metformin 500 mg twice daily -Insulin sliding scale, with bedtime coverage   DVT prophylaxis: Lovenox. Code Status: Full code. Family Communication:  Family at bed side. Disposition Plan:    Status is: Observation  The patient remains OBS appropriate and will d/c before 2 midnights.  Dispo: The patient is from: Home              Anticipated d/c is to: Home              Patient currently is not medically stable to d/c.   Difficult to place patient No   Consultants:   Orthopedics, neurology.  Procedures: None Antimicrobials:  Anti-infectives (From admission, onward)   None      Subjective: Patient was seen and examined at bedside.  Overnight events noted.   Patient reports having worsening left leg pain, states pain is controlled with morphine but as the effect wears off,  her blood pressure shoots up and she screams in pain.  Objective: Vitals:   03/22/20 2300 03/23/20 0032 03/23/20 0425 03/23/20 0815  BP: (!) 148/83 119/71 (!) 138/94 140/90  Pulse: 90 80 98 87  Resp: 13 18 18 17   Temp:  97.6 F (36.4 C) 97.6 F (36.4 C) 98 F (36.7 C)  TempSrc:    Oral  SpO2: 96% 93% 95% 100%  Weight:      Height:       No intake or output data in the 24 hours ending 03/23/20 1029 Filed Weights   03/22/20 1206  Weight: 111.1 kg    Examination:  General exam:  Appears calm and comfortable, not in any acute distress. Respiratory system: Clear to auscultation. Respiratory effort normal. Cardiovascular system: S1 & S2 heard, RRR. No JVD, murmurs, rubs, gallops or clicks. No pedal edema. Gastrointestinal system: Abdomen is nondistended, soft and nontender. No organomegaly or masses felt. Normal bowel sounds heard. Central nervous system: Alert and oriented. No focal neurological deficits. Extremities: Symmetric 5 x 5 power., left leg tenderness   Noted. Skin: No rashes, lesions or ulcers Psychiatry: Judgement and insight appear normal. Mood & affect appropriate.     Data Reviewed: I have personally reviewed following labs and imaging studies  CBC: Recent Labs  Lab 03/22/20 1421 03/23/20 0412  WBC 10.4 10.4  NEUTROABS 6.8  --   HGB 14.3 14.0  HCT 41.3 40.3  MCV 83.3 83.3  PLT 345 161   Basic Metabolic Panel: Recent Labs  Lab 03/22/20 1421 03/23/20 0412  NA 134* 138  K 4.0 3.6  CL 99 99  CO2 23 26  GLUCOSE 200* 279*  BUN 14 16  CREATININE 0.94 1.02*  CALCIUM 10.3 10.4*   GFR: Estimated Creatinine Clearance: 75.5 mL/min (A) (by C-G formula based on SCr of 1.02 mg/dL (H)). Liver Function Tests: Recent Labs  Lab 03/22/20 1421  AST 24  ALT 21  ALKPHOS 79  BILITOT 0.8  PROT 8.3*  ALBUMIN 4.5   No results for input(s): LIPASE, AMYLASE in the last 168 hours. No results for input(s): AMMONIA in the last 168 hours. Coagulation Profile: No results for input(s): INR, PROTIME in the last 168 hours. Cardiac Enzymes: No results for input(s): CKTOTAL, CKMB, CKMBINDEX, TROPONINI in the last 168 hours. BNP (last 3 results) No results for input(s): PROBNP in the last 8760 hours. HbA1C: No results for input(s): HGBA1C in the last 72 hours. CBG: Recent Labs  Lab 03/22/20 2249 03/23/20 0813  GLUCAP 227* 279*   Lipid Profile: No results for input(s): CHOL, HDL, LDLCALC, TRIG, CHOLHDL, LDLDIRECT in the last 72 hours. Thyroid Function Tests: No results for input(s): TSH, T4TOTAL, FREET4, T3FREE, THYROIDAB in the last 72 hours. Anemia Panel: No results for input(s): VITAMINB12, FOLATE, FERRITIN, TIBC, IRON, RETICCTPCT in the last 72 hours. Sepsis Labs: No results for input(s): PROCALCITON, LATICACIDVEN in the last 168 hours.  Recent Results (from the past 240 hour(s))  SARS CORONAVIRUS 2 (TAT 6-24 HRS) Nasopharyngeal Nasopharyngeal Swab     Status: None   Collection Time: 03/22/20  8:32 PM   Specimen:  Nasopharyngeal Swab  Result Value Ref Range Status   SARS Coronavirus 2 NEGATIVE NEGATIVE Final    Comment: (NOTE) SARS-CoV-2 target nucleic acids are NOT DETECTED.  The SARS-CoV-2 RNA is generally detectable in upper and lower respiratory specimens during the acute phase of infection. Negative results do not preclude SARS-CoV-2 infection, do not rule out co-infections with other pathogens, and should not be used as the sole basis for treatment or other patient management decisions. Negative results must be combined with clinical observations, patient history, and epidemiological information. The expected result is Negative.  Fact Sheet for Patients: SugarRoll.be  Fact Sheet for Healthcare Providers: https://www.woods-mathews.com/  This test is not yet approved or cleared by the [place] FDA and  has been authorized for detection and/or diagnosis of SARS-CoV-2 by FDA under an Emergency Use Authorization (EUA). This EUA will remain  in effect (meaning this test can be used) for the duration of the COVID-19 declaration under Se ction 564(b)(1) of the Act, 21 U.S.C. section 360bbb-3(b)(1), unless the authorization is terminated  or revoked sooner.  Performed at [facility], [place] [address], [place], [place] [address]     Radiology Studies: CT Head Wo Contrast  Result Date: 03/22/2020 CLINICAL DATA:  Transient ischemic attack. EXAM: CT HEAD WITHOUT CONTRAST TECHNIQUE: Contiguous axial images were obtained from the base of the skull through the vertex without intravenous contrast. COMPARISON:  February 17, 2019. FINDINGS: Brain: No evidence of acute infarction, hemorrhage, hydrocephalus, extra-axial collection or mass lesion/mass effect. Vascular: No hyperdense vessel or unexpected calcification. Skull: Normal. Negative for fracture or focal lesion. Sinuses/Orbits: No acute finding. Other: None. IMPRESSION: Normal head CT. Electronically  Signed   By: Marijo Conception M.D.   On: 03/22/2020 13:39   MR BRAIN WO CONTRAST  Result Date: 03/22/2020 CLINICAL DATA:  Transient ischemic attack. Tingling of the left arm. EXAM: MRI HEAD WITHOUT CONTRAST TECHNIQUE: Multiplanar, multiecho pulse sequences of the brain and surrounding structures were obtained without intravenous contrast. COMPARISON:  Head CT same day FINDINGS: Brain: The brain has a normal appearance without evidence of malformation, atrophy, old or acute small or large vessel infarction, mass lesion, hemorrhage, hydrocephalus or extra-axial collection. Vascular: Major vessels at the base of the brain show flow. Venous sinuses appear patent. Skull and upper cervical spine: Somewhat hypercellular marrow pattern of the calvarium, clivus and cervical spine, most commonly seen in the setting of anemia. Sinuses/Orbits: Clear/normal. Other: None significant. IMPRESSION: 1. Normal appearance of the brain itself. 2. Somewhat hypercellular marrow pattern of the calvarium, clivus and cervical spine, most commonly seen in the setting of anemia. Electronically Signed   By: Nelson Chimes M.D.   On: 03/22/2020 22:49   DG Pelvis Portable  Result Date: 03/22/2020 CLINICAL DATA:  Pelvic and left leg pain. EXAM: PORTABLE PELVIS 1-2 VIEWS COMPARISON:  None. FINDINGS: There is no evidence of pelvic fracture or diastasis. No pelvic bone lesions are seen. Bilateral hip prostheses are seen in expected position. IMPRESSION: Negative. Electronically Signed   By: Marlaine Hind M.D.   On: 03/22/2020 21:41   DG Chest Portable 1 View  Result Date: 03/22/2020 CLINICAL DATA:  Left arm pain. EXAM: PORTABLE CHEST 1 VIEW COMPARISON:  February 16, 2009. FINDINGS: Stable cardiomegaly. No pneumothorax or pleural effusion is noted. Both lungs are clear. The visualized skeletal structures are unremarkable. IMPRESSION: No active disease. Electronically Signed   By: Marijo Conception M.D.   On: 03/22/2020 15:56   DG FEMUR MIN 2  VIEWS LEFT  Result Date: 03/22/2020 CLINICAL DATA:  Left flank pain. EXAM: LEFT FEMUR 2 VIEWS COMPARISON:  None. FINDINGS: Bipolar left hip prosthesis is seen in appropriate position. There is no evidence of fracture or dislocation. No other bone lesion seen involving the femur. Mild degenerative spurring is seen involving the knee joint. IMPRESSION: No acute findings. Mild left knee osteoarthritis and left hip prosthesis. Electronically Signed   By: Marlaine Hind M.D.   On: 03/22/2020 21:43   Scheduled Meds: . irbesartan  300 mg Oral Daily   And  . amLODipine  10 mg Oral Daily   And  . hydrochlorothiazide  25 mg Oral Daily  . enoxaparin (LOVENOX) injection  0.5 mg/kg Subcutaneous Q24H  . hydrALAZINE  10 mg Oral Daily  . insulin aspart  0-15 Units Subcutaneous TID WC  . insulin aspart  0-5 Units Subcutaneous QHS  . metFORMIN  500 mg Oral BID WC  . metoprolol succinate  25 mg Oral Daily  . spironolactone  25 mg Oral Daily  . [  START ON 03/25/2020] Vitamin D (Ergocalciferol)  50,000 Units Oral Q Mon   Continuous Infusions:   LOS: 0 days    Time spent: 35 mins    Zayin Valadez, MD [facility]   If 7PM-7AM, please contact night-coverage

## 2020-03-23 NOTE — Consult Note (Signed)
ORTHOPAEDIC CONSULTATION  REQUESTING PHYSICIAN: Shawna Clamp, MD  Chief Complaint:   Left lower extremity pain  History of Present Illness: Claire Hansen is a 60 y.o. female with a past medical history of hypertension and diabetes who was admitted yesterday due to severe left lower extremity pain, tingling sensations in her arm, and Significantly elevated blood pressure.  The patient also has a history of prior left total hip arthroplasty performed by Dr. Rudene Christians in 2019.  She is noted to have femoral component loosening and was initially scheduled for a revision surgery in January, but due to Covid restrictions was postponed and was then rescheduled for 03/26/2020.  However, at her preoperative visit on 03/19/2020, she was noted to have significant left lower extremity pain consistent with a neurogenic etiology as well as a foot drop on the left side.  The patient notes that the foot drop has been gradually progressing for approximately 2 months.  A stat lumbar spine MRI was obtained last week.  The results were discussed with the neurosurgery team and given the chronicity of the foot drop, no immediate surgical intervention was planned.  The patient is scheduled to follow-up with Dr. Lacinda Axon in neurosurgery as an outpatient on 03/26/2020 to further discuss her options and prognosis.  She describes her primary pain is located along the lateral aspect of the left distal thigh with radiation down to her foot.  She notes she does have some groin pain and anterior thigh pain with weightbearing, although this is milder currently.  Since her admission, her pain appears to be more appropriately controlled with medications.  Past Medical History:  Diagnosis Date  . Allergy   . Anxiety   . Asthma    pt denies having asthma  . Chronic osteoarthritis    knees  . Deviated septum   . Diabetes mellitus without complication ([place])   . Hyperlipidemia    . Hypertension   . Irregular menstrual cycle   . Lymphadenopathy   . Obesity   . Peri-menopause   . Pneumonia 2015   Past Surgical History:  Procedure Laterality Date  . BREAST SURGERY Right    Cyst removed  . CESAREAN SECTION     x2  . CHOLECYSTECTOMY    . DILATION AND CURETTAGE OF UTERUS     Treatment of Metorrhagia and Endometrial Ablation  . TONSILLECTOMY    . TOTAL HIP ARTHROPLASTY Right 11/05/2016   Procedure: TOTAL HIP ARTHROPLASTY ANTERIOR APPROACH;  Surgeon: Hessie Knows, MD;  Location: [facility] OR[facility];  Service: Orthopedics;  Laterality: Right;  . TOTAL HIP ARTHROPLASTY Left 07/01/2017   Procedure: TOTAL HIP ARTHROPLASTY ANTERIOR APPROACH;  Surgeon: Hessie Knows, MD;  Location: [facility] OR[facility];  Service: Orthopedics;  Laterality: Left;   Social History   Socioeconomic History  . Marital status: Married    Spouse name: Not on file  . Number of children: Not on file  . Years of education: Not on file  . Highest education level: Not on file  Occupational History  . Not on file  Tobacco Use  . Smoking status: Never Smoker  . Smokeless tobacco: Never Used  Vaping Use  . Vaping Use: Never used  Substance and Sexual Activity  . Alcohol use: No    Alcohol/week: 0.0 standard drinks  . Drug use: No  . Sexual activity: Yes    Partners: Male  Other Topics Concern  . 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 Asthma Son    Allergies  Allergen Reactions  . Shellfish Allergy Anaphylaxis and Nausea And Vomiting    Topical betadine is OK to use.   Prior to Admission medications   Medication Sig Start  Date End Date Taking? Authorizing Provider  acetaminophen (TYLENOL) 500 MG tablet Take 1,000 mg by mouth every 8 (eight) hours as needed for mild pain or headache. Reported on 05/15/2015 05/29/13  Yes [provider]  hydrALAZINE (APRESOLINE) 10 MG tablet Take 10 mg by mouth daily.   Yes [provider]  HYDROcodone-acetaminophen (NORCO/VICODIN) 5-325 MG tablet Take 1 tablet by mouth every 8 (eight) hours as needed (pain). 03/04/20  Yes [provider]  hydrocortisone cream 0.5 % Apply 1 application topically daily as needed for itching ([place]).   Yes [provider]  meloxicam (MOBIC) 15 MG tablet Take 15 mg by mouth daily as needed for pain.   Yes [provider]  metFORMIN (GLUCOPHAGE-XR) 500 MG 24 hr tablet TAKE 1 TABLET BY MOUTH  DAILY WITH BREAKFAST Patient taking differently: Take 500 mg by mouth 2 (two) times daily. 04/10/16  Yes Cook, Jayce G, DO  metoprolol succinate (TOPROL-XL) 25 MG 24 hr tablet Take 25 mg by mouth daily.   Yes [provider]  montelukast (SINGULAIR) 10 MG tablet Take 1 tablet (10 mg total) by mouth daily. Patient taking differently: Take 10 mg by mouth daily as needed (allergies). 02/21/16  Yes Cook, Jayce G, DO  Olmesartan-Amlodipine-HCTZ 40-10-25 MG TABS Take 1 tablet by mouth daily. 02/21/16  Yes Cook, Jayce G, DO  oxyCODONE (OXY IR/ROXICODONE) 5 MG immediate release tablet Take 1 tablet by mouth every 4 (four) hours as needed. 03/19/20  Yes [provider]  Semaglutide (RYBELSUS) 7 MG TABS Take 7 mg by mouth daily.   Yes [provider]  spironolactone (ALDACTONE) 25 MG tablet Take 25 mg by mouth daily. 02/16/20  Yes [provider]  traMADol (ULTRAM) 50 MG tablet Take 1-2 tablets (50-100 mg total) by mouth every 4 (four) hours as needed. Patient taking differently: Take 50-100 mg by mouth 3 (three) times daily as needed for moderate pain or severe pain (severe pain). 11/07/16  Yes Watt Climes,  PA  Vitamin D, Ergocalciferol, (DRISDOL) 1.25 MG (50000 UNIT) CAPS capsule Take 50,000 Units by mouth every Monday. 01/18/20  Yes [provider]   Recent Labs    03/22/20 1421 03/23/20 0412  WBC 10.4 10.4  HGB 14.3 14.0  HCT 41.3 40.3  PLT 345 369  K 4.0 3.6  CL 99 99  CO2 23 26  BUN 14 16  CREATININE 0.94 1.02*  GLUCOSE 200* 279*  CALCIUM 10.3 10.4*   CT Head Wo Contrast  Result Date: 03/22/2020 CLINICAL DATA:  Transient ischemic attack. EXAM: CT HEAD WITHOUT CONTRAST TECHNIQUE: Contiguous axial images were obtained from the base of the skull through the vertex without intravenous contrast. COMPARISON:  February 17, 2019. FINDINGS: Brain: No evidence of acute infarction, hemorrhage, hydrocephalus, extra-axial collection or mass lesion/mass effect. Vascular: No hyperdense vessel or unexpected calcification. Skull: Normal. Negative  for fracture or focal lesion. Sinuses/Orbits: No acute finding. Other: None. IMPRESSION: Normal head CT. Electronically Signed   By: Marijo Conception M.D.   On: 03/22/2020 13:39   MR BRAIN WO CONTRAST  Result Date: 03/22/2020 CLINICAL DATA:  Transient ischemic attack. Tingling of the left arm. EXAM: MRI HEAD WITHOUT CONTRAST TECHNIQUE: Multiplanar, multiecho pulse sequences of the brain and surrounding structures were obtained without intravenous contrast. COMPARISON:  Head CT same day FINDINGS: Brain: The brain has a normal appearance without evidence of malformation, atrophy, old or acute small or large vessel infarction, mass lesion, hemorrhage, hydrocephalus or extra-axial collection. Vascular: Major vessels at the base of the brain show flow. Venous sinuses appear patent. Skull and upper cervical spine: Somewhat hypercellular marrow pattern of the calvarium, clivus and cervical spine, most commonly seen in the setting of anemia. Sinuses/Orbits: Clear/normal. Other: None significant. IMPRESSION: 1. Normal appearance of the brain itself. 2. Somewhat  hypercellular marrow pattern of the calvarium, clivus and cervical spine, most commonly seen in the setting of anemia. Electronically Signed   By: Nelson Chimes M.D.   On: 03/22/2020 22:49   DG Pelvis Portable  Result Date: 03/22/2020 CLINICAL DATA:  Pelvic and left leg pain. EXAM: PORTABLE PELVIS 1-2 VIEWS COMPARISON:  None. FINDINGS: There is no evidence of pelvic fracture or diastasis. No pelvic bone lesions are seen. Bilateral hip prostheses are seen in expected position. IMPRESSION: Negative. Electronically Signed   By: Marlaine Hind M.D.   On: 03/22/2020 21:41   DG Chest Portable 1 View  Result Date: 03/22/2020 CLINICAL DATA:  Left arm pain. EXAM: PORTABLE CHEST 1 VIEW COMPARISON:  February 16, 2009. FINDINGS: Stable cardiomegaly. No pneumothorax or pleural effusion is noted. Both lungs are clear. The visualized skeletal structures are unremarkable. IMPRESSION: No active disease. Electronically Signed   By: Marijo Conception M.D.   On: 03/22/2020 15:56   DG FEMUR MIN 2 VIEWS LEFT  Result Date: 03/22/2020 CLINICAL DATA:  Left flank pain. EXAM: LEFT FEMUR 2 VIEWS COMPARISON:  None. FINDINGS: Bipolar left hip prosthesis is seen in appropriate position. There is no evidence of fracture or dislocation. No other bone lesion seen involving the femur. Mild degenerative spurring is seen involving the knee joint. IMPRESSION: No acute findings. Mild left knee osteoarthritis and left hip prosthesis. Electronically Signed   By: Marlaine Hind M.D.   On: 03/22/2020 21:43     Lumbar Spine MRI  03/19/20:  IMPRESSION: Lumbar spondylosis superimposed upon a congenitally narrow lumbar spinal canal, as outlined and with findings most notably as follows.  At L4-L5, there is trace anterolisthesis. Disc uncovering with disc bulge and endplate spurring. Moderate facet arthrosis with ligamentum flavum hypertrophy. Small bilateral facet joint effusions. Bilateral subarticular narrowing with potential to  affect either descending L5 nerve root. Moderately severe central canal stenosis. Moderate bilateral neural foraminal narrowing (greater on the left).  At L2-L3, there is multifactorial mild bilateral subarticular narrowing without appreciable nerve root impingement. Mild bilateral neural foraminal narrowing. Small right facet joint effusion.  At L3-L4, there is multifactorial mild bilateral subarticular and central canal narrowing without appreciable nerve root impingement. Bilateral neural foraminal narrowing (mild right, moderate left). Small bilateral facet joint effusions.  At L5-S1, there is multifactorial mild left subarticular narrowing without appreciable nerve root impingement. Bilateral neural foraminal narrowing (moderate right, mild/moderate left). Small left facet joint effusion.  Partially imaged fibroid uterus.     Positive ROS: All other systems have been reviewed and were otherwise negative  with the exception of those mentioned in the HPI and as above.  Physical Exam: BP (!) 150/96 (BP Location: Left Arm)   Pulse 91   Temp (!) 97.5 F (36.4 C) (Oral)   Resp 20   Ht 5\' 5"  (1.651 m)   Wt 111.1 kg   LMP 06/04/2016   SpO2 97%   BMI 40.77 kg/m  General:  Alert, no acute distress Psychiatric:  Patient is competent for consent with normal mood and affect   Cardiovascular:  No pedal edema, regular rate and rhythm Respiratory:  No wheezing, non-labored breathing GI:  Abdomen is soft and non-tender Skin:  No lesions in the area of chief complaint, no erythema Neurologic:  Sensation intact distally, CN grossly intact Lymphatic:  No axillary or cervical lymphadenopathy  Orthopedic Exam:  Bilateral lower extremities: - 5/5 PF, KF/KE, HF. 5/5 EHL/DF on RLE. 1/5 EHL/DF on LLE - SILT over foot and leg except for medial aspect of leg and foot. Mildly diminished sensation on dorsum of foot - Feet wwp - No pathologic reflexes - No pathologic clonus; normal  Babinksi sign - Mild groin pain with hip flexion and IR/ER  Imaging:  Radiographs of the left hip/femur suggest femoral component on the left side with possible loosening.  MRI of the lumbar spine with foraminal narrowing primary at L4/5, worse on the left   Assessment/Plan: 60 year old female with likely multifactorial etiology of left lower extremity pain.  She has findings suggestive of left hip femoral component loosening and was scheduled for surgery next week with Dr. Rudene Christians.  However at her preoperative visit, she had more significant sciatic type pain with a complete foot drop.  Lumbar spine MRI was obtained and findings were discussed with the neurosurgery team who recommended follow-up with them this upcoming week.  Patient's pain appears to be much more controlled today compared to at the time of admission. 1.  Recommend continued medical management with medications such as gabapentin, Medrol Dosepak, and continuing narcotics as patient takes narcotic medication at baseline. ther possible modalities for pain could include lumbar epidural steroid injection.  2.  PT/OT may be beneficial  3.  Patient has a follow-up appointment with Dr. Lacinda Axon with neurosurgery on 03/26/2020.  If the patient has a more prolonged inpatient stay, I would recommend obtaining an AFO and possibly EMG/NCS of the lower extremity as well.    4.  Given appropriate follow-up plan and current comfort level, patient may be discharged from an orthopedic perspective with appropriate medical management.  AFO, EMG/NCS, and epidural steroid injections can be obtained as an outpatient if deemed necessary after evaluation with Dr. Lacinda Axon.  5.  Please page with any further questions.   Leim Fabry   03/23/2020 1:59 PM

## 2020-03-24 DIAGNOSIS — M79605 Pain in left leg: Secondary | ICD-10-CM | POA: Diagnosis not present

## 2020-03-24 LAB — MAGNESIUM: Magnesium: 1.7 mg/dL (ref 1.7–2.4)

## 2020-03-24 LAB — BASIC METABOLIC PANEL
Anion gap: 9 (ref 5–15)
BUN: 17 mg/dL (ref 6–20)
CO2: 28 mmol/L (ref 22–32)
Calcium: 10.4 mg/dL — ABNORMAL HIGH (ref 8.9–10.3)
Chloride: 99 mmol/L (ref 98–111)
Creatinine, Ser: 1.08 mg/dL — ABNORMAL HIGH (ref 0.44–1.00)
GFR, Estimated: 60 mL/min — ABNORMAL LOW (ref >60)
Glucose, Bld: 219 mg/dL — ABNORMAL HIGH (ref 70–99)
Potassium: 4 mmol/L (ref 3.5–5.1)
Sodium: 136 mmol/L (ref 135–145)

## 2020-03-24 LAB — GLUCOSE, CAPILLARY
Glucose-Capillary: 257 mg/dL — ABNORMAL HIGH (ref 70–99)
Glucose-Capillary: 172 mg/dL — ABNORMAL HIGH (ref 70–99)

## 2020-03-24 LAB — PHOSPHORUS: Phosphorus: 4.5 mg/dL (ref 2.5–4.6)

## 2020-03-24 MED ORDER — GABAPENTIN 600 MG PO TABS: 300.0000 mg | ORAL_TABLET | Freq: Three times a day (TID) | ORAL | 1 refills | Status: DC

## 2020-03-24 MED ORDER — METHYLPREDNISOLONE 4 MG PO TBPK: 4.0000 mg | ORAL_TABLET | Freq: Four times a day (QID) | ORAL | Status: DC

## 2020-03-24 MED ORDER — METHYLPREDNISOLONE 4 MG PO TBPK: 4.0000 mg | ORAL_TABLET | Freq: Three times a day (TID) | ORAL | Status: DC

## 2020-03-24 MED ORDER — GABAPENTIN 600 MG PO TABS
300.0000 mg | ORAL_TABLET | Freq: Three times a day (TID) | ORAL | Status: DC
  Administered 2020-03-24: 300 mg via ORAL
  Filled 2020-03-24: qty 1

## 2020-03-24 MED ORDER — METHYLPREDNISOLONE 4 MG PO TBPK: ORAL_TABLET | ORAL | 0 refills | Status: DC

## 2020-03-24 MED ORDER — METHYLPREDNISOLONE 4 MG PO TBPK
8.0000 mg | ORAL_TABLET | Freq: Every morning | ORAL | Status: AC
  Administered 2020-03-24: 8 mg via ORAL
  Filled 2020-03-24: qty 21

## 2020-03-24 MED ORDER — METHYLPREDNISOLONE 4 MG PO TBPK: 8.0000 mg | ORAL_TABLET | Freq: Every evening | ORAL | Status: DC

## 2020-03-24 MED ORDER — METHYLPREDNISOLONE 4 MG PO TBPK: 4.0000 mg | ORAL_TABLET | ORAL | Status: DC

## 2020-03-24 MED ORDER — METHYLPREDNISOLONE 4 MG PO TBPK
4.0000 mg | ORAL_TABLET | ORAL | Status: AC
  Administered 2020-03-24: 4 mg via ORAL

## 2020-03-24 NOTE — Discharge Summary (Signed)
Physician Discharge Summary  Claire Hansen QIO:[id number] DOB: Dec 18, 1960 DOA: 03/22/2020  PCP: Perrin Maltese, MD  Admit date: 03/22/2020   Discharge date: 03/24/2020  Admitted From: Home.  Disposition:  Home.  Recommendations for Outpatient Follow-up:  1. Follow up with PCP in 1-2 weeks. 2. Please obtain BMP/CBC in one week. 3. Advised to follow up Neurosurgery Dr. Lacinda Axon as scheduled. 4. Advised to follow up Orthopaedics as scheduled. 5. Advised to take Gabapentin 300 mg PO TID and medrol pack as directed.  Home Health: None Equipment/Devices: None  Discharge Condition: Stable CODE STATUS:Full code Diet recommendation: Heart Healthy  Brief [facility] course: This 60 years old morbidly obese female with PMH significant for hypertension, non-insulin-dependent diabetes mellitus presents in the emergency department with complaints of worsening left leg pain.  Patient reports having left leg pain for last few months which was initially intermittent and was getting worse with weightbearing but now has progressed to constant pain.  She reports tingling and sensation down her left leg and left arm which started recently caused her to present in the emergency department for further evaluation.  Patient reports having left hip replacement.  She was scheduled to have left femur surgery in March however due to the concerns of foot drop and recent lumbar MRI, Orthopedic surgery has deferred surgery pending neurosurgery evaluation. Orthopedics, Neurology was consulted.  Neurology recommended outpatient neurosurgical evaluation,  PT and OT evaluation and if patient has more prolonged inpatient stay,  orthopedics has recommended obtaining an AFO and possibly EMG/NCS of lower extremity.  Orthopedic recommended continued medical management with medications such as gabapentin and Medrol Dosepak and continuing narcotics as patient takes narcotic medication at baseline.  Patient might require modalities  for pain such as lumbar epidural steroid injections that can be done outpatient.  Patient feels better after patient was started on gabapentin and Medrol pack,  She has ambulated in the hallway and maintained the balance.  Patient wants to be discharged and patient is being discharged home.  She was managed for below problems.  Discharge Diagnoses:  Active Problems:   Hyperlipidemia   History of asthma   Resistant hypertension   Morbid obesity with BMI of 45.0-49.9, adult (HCC)   Osteoarthritis of right knee   Primary localized osteoarthritis of left hip   Leg pain, anterior, left  Lateral left upper leg pain-etiology unclear -Hip and femoral x-ray : Mild left knee osteoarthritis and left hip prosthesis. -Adequate pain control with morphine 2 mg every 2 hours IV -Resumed home tramadol -Patient continues to have pain and MRI of the brain is negative. - Orthopaedics consulted ,  recommended to continue medical management with medications such as gabapentin and Medrol Dosepak and continuing narcotics as patient takes narcotic medication at baseline.   Patient might require modalities for pain such as lumbar epidural steroid injections that can be done outpatient.  Numbness and tingling of the left side of her body -CT of the head was negative -MRI of the head is unremarkable. -Neurology consulted, recommended outpatient neurosurgical evaluation,  PT and OT evaluation and if patient has more prolonged inpatient stay,  orthopedics has recommended obtaining an AFO and possibly EMG/NCS of lower extremity.    HTN - BP elevated, suspect secondary to pain. -Status post labetalol 5 mg IV per EDP -Resumed home hydralazine 10 mg daily, metoprolol succinate 25 mg daily, spironolactone 25 mg daily, olmesartan-amlodipine-hydrochlorothiazide 40-10-25 daily. - Moniter BP   Non-insulin-dependent diabetes mellitus -resumed home Metformin 500 mg twice daily -  Insulin sliding scale,withbedtime  coverage  Discharge Instructions  Discharge Instructions    Call MD for:  persistant dizziness or light-headedness   Complete by: As directed    Call MD for:  persistant nausea and vomiting   Complete by: As directed    Call MD for:  severe uncontrolled pain   Complete by: As directed    Call MD for:  temperature >100.4   Complete by: As directed    Diet - low sodium heart healthy   Complete by: As directed    Diet Carb Modified   Complete by: As directed    Discharge instructions   Complete by: As directed    Advised to follow up with PCP  in one week. Advised to follow up Neurosurgery Dr. Lacinda Axon as scheduled. Advised to follow up Orthopaedics as scheduled. Advised to take Gabapentin 300 mg PO TID and medrol pack as directed.   Increase activity slowly   Complete by: As directed      Allergies as of 03/24/2020      Reactions   Shellfish Allergy Anaphylaxis, Nausea And Vomiting   Topical betadine is OK to use.      Medication List    TAKE these medications   acetaminophen 500 MG tablet Commonly known as: TYLENOL Take 1,000 mg by mouth every 8 (eight) hours as needed for mild pain or headache. Reported on 05/15/2015   gabapentin 600 MG tablet Commonly known as: NEURONTIN Take 0.5 tablets (300 mg total) by mouth 3 (three) times daily.   hydrALAZINE 10 MG tablet Commonly known as: APRESOLINE Take 10 mg by mouth daily.   HYDROcodone-acetaminophen 5-325 MG tablet Commonly known as: NORCO/VICODIN Take 1 tablet by mouth every 8 (eight) hours as needed (pain).   hydrocortisone cream 0.5 % Apply 1 application topically daily as needed for itching (Rash).   meloxicam 15 MG tablet Commonly known as: MOBIC Take 15 mg by mouth daily as needed for pain.   metFORMIN 500 MG 24 hr tablet Commonly known as: GLUCOPHAGE-XR TAKE 1 TABLET BY MOUTH  DAILY WITH BREAKFAST What changed: when to take this   methylPREDNISolone 4 MG Tbpk tablet Commonly known as: [place] to take it as directed.   metoprolol succinate 25 MG 24 hr tablet Commonly known as: TOPROL-XL Take 25 mg by mouth daily.   montelukast 10 MG tablet Commonly known as: SINGULAIR Take 1 tablet (10 mg total) by mouth daily. What changed:   when to take this  reasons to take this   Olmesartan-amLODIPine-HCTZ 40-10-25 MG Tabs Take 1 tablet by mouth daily.   oxyCODONE 5 MG immediate release tablet Commonly known as: Oxy IR/ROXICODONE Take 1 tablet by mouth every 4 (four) hours as needed.   Rybelsus 7 MG Tabs Generic drug: Semaglutide Take 7 mg by mouth daily.   spironolactone 25 MG tablet Commonly known as: ALDACTONE Take 25 mg by mouth daily.   traMADol 50 MG tablet Commonly known as: ULTRAM Take 1-2 tablets (50-100 mg total) by mouth every 4 (four) hours as needed. What changed:   when to take this  reasons to take this   Vitamin D (Ergocalciferol) 1.25 MG (50000 UNIT) Caps capsule Commonly known as: DRISDOL Take 50,000 Units by mouth every Monday.       Follow-up Information    Perrin Maltese, MD Follow up in 1 week(s).   Specialty: Internal Medicine Contact information: [address] [place] [address] [phone]  Allergies  Allergen Reactions  . Shellfish Allergy Anaphylaxis and Nausea And Vomiting    Topical betadine is OK to use.    Consultations:  Neurology  Orthopeadics   Procedures/Studies: CT Head Wo Contrast  Result Date: 03/22/2020 CLINICAL DATA:  Transient ischemic attack. EXAM: CT HEAD WITHOUT CONTRAST TECHNIQUE: Contiguous axial images were obtained from the base of the skull through the vertex without intravenous contrast. COMPARISON:  February 17, 2019. FINDINGS: Brain: No evidence of acute infarction, hemorrhage, hydrocephalus, extra-axial collection or mass lesion/mass effect. Vascular: No hyperdense vessel or unexpected calcification. Skull: Normal. Negative for fracture or focal lesion.  Sinuses/Orbits: No acute finding. Other: None. IMPRESSION: Normal head CT. Electronically Signed   By: Marijo Conception M.D.   On: 03/22/2020 13:39   MR BRAIN WO CONTRAST  Result Date: 03/22/2020 CLINICAL DATA:  Transient ischemic attack. Tingling of the left arm. EXAM: MRI HEAD WITHOUT CONTRAST TECHNIQUE: Multiplanar, multiecho pulse sequences of the brain and surrounding structures were obtained without intravenous contrast. COMPARISON:  Head CT same day FINDINGS: Brain: The brain has a normal appearance without evidence of malformation, atrophy, old or acute small or large vessel infarction, mass lesion, hemorrhage, hydrocephalus or extra-axial collection. Vascular: Major vessels at the base of the brain show flow. Venous sinuses appear patent. Skull and upper cervical spine: Somewhat hypercellular marrow pattern of the calvarium, clivus and cervical spine, most commonly seen in the setting of anemia. Sinuses/Orbits: Clear/normal. Other: None significant. IMPRESSION: 1. Normal appearance of the brain itself. 2. Somewhat hypercellular marrow pattern of the calvarium, clivus and cervical spine, most commonly seen in the setting of anemia. Electronically Signed   By: Nelson Chimes M.D.   On: 03/22/2020 22:49   MR LUMBAR SPINE WO CONTRAST  Result Date: 03/19/2020 CLINICAL DATA:  Myelopathy concurrent with and due to lumbosacral intervertebral disc disorder. Acute left-sided low back pain with left-sided sciatica. Additional history provided by scanning technologist: Patient reports low back pain with pain in left leg, difficulty lifting left leg, symptoms for 2 months. EXAM: MRI LUMBAR SPINE WITHOUT CONTRAST TECHNIQUE: Multiplanar, multisequence MR imaging of the lumbar spine was performed. No intravenous contrast was administered. COMPARISON:  No pertinent prior exams available for comparison. FINDINGS: Segmentation: For the purposes of this dictation, five lumbar vertebrae are assumed and the caudal most  well-formed intervertebral disc is designated L5-S1. Alignment: Mild lumbar levocurvature. Trace grade 1 anterolisthesis at L4-L5. Vertebrae: Vertebral body height is maintained. No significant marrow edema or focal suspicious osseous lesion. Conus medullaris and cauda equina: Conus extends to the L2 level. No signal abnormality within the visualized distal spinal cord. Paraspinal and other soft tissues: Partially imaged enlarged fibroid uterus. Bilateral renal cysts. Paraspinal soft tissues within normal limits. Disc levels: Mild multilevel disc degeneration greatest at L4-L5. Congenitally narrow lumbar spinal canal. T11-T12: Imaged sagittally. Shallow disc bulge. No significant spinal canal or foraminal stenosis. T12-L1: No significant disc herniation or spinal canal stenosis. Facet arthrosis (greater on the left) with mild left neural foraminal narrowing. L1-L2: Facet arthrosis.  No significant disc herniation or stenosis. L2-L3: Disc bulge. Mild-to-moderate facet arthrosis with ligamentum flavum hypertrophy. Small right facet joint effusion. Mild bilateral subarticular narrowing without appreciable nerve root impingement. Central canal patent. Mild bilateral neural foraminal narrowing. L3-L4: Disc bulge. Moderate facet arthrosis with ligamentum flavum hypertrophy. Small bilateral facet joint effusions. Mild bilateral subarticular and central canal narrowing without appreciable nerve root impingement. Bilateral neural foraminal narrowing (mild right, moderate left). L4-L5: Disc bulge with endplate spurring. Moderate  facet arthrosis with ligamentum flavum hypertrophy. Small bilateral facet joint effusions. Bilateral subarticular narrowing with potential to affect either descending L5 nerve root. Moderately severe central canal stenosis. Moderate bilateral neural foraminal narrowing (greater on the left). L5-S1: Disc bulge with endplate spurring. Mild-to-moderate facet arthrosis on the left with ligamentum flavum  hypertrophy. Small left facet joint effusion. Mild left subarticular narrowing without appreciable nerve root impingement. Central canal patent. Bilateral neural foraminal narrowing (moderate right, mild/moderate left). 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 At L4-L5, there is trace anterolisthesis. Disc uncovering with disc bulge and endplate spurring. Moderate facet arthrosis with ligamentum flavum hypertrophy. Small bilateral facet joint effusions. Bilateral subarticular narrowing with potential to affect either descending L5 nerve root. Moderately severe central canal stenosis. Moderate bilateral neural foraminal narrowing (greater on the left). At L2-L3, there is multifactorial mild bilateral subarticular narrowing without appreciable nerve root impingement. Mild bilateral neural foraminal narrowing. Small right facet joint effusion. At L3-L4, there is multifactorial mild bilateral subarticular and central canal narrowing without appreciable nerve root impingement. Bilateral neural foraminal narrowing (mild right, moderate left). Small bilateral facet joint effusions. At L5-S1, there is multifactorial mild left subarticular narrowing without appreciable nerve root impingement. Bilateral neural foraminal narrowing (moderate right, mild/moderate left). Small left facet joint effusion. Partially imaged fibroid uterus. Electronically Signed   By: Kellie Simmering DO   On: 03/19/2020 12:52   DG Pelvis Portable  Result Date: 03/22/2020 CLINICAL DATA:  Pelvic and left leg pain. EXAM: PORTABLE PELVIS 1-2 VIEWS COMPARISON:  None. FINDINGS: There is no evidence of pelvic fracture or diastasis. No pelvic bone lesions are seen. Bilateral hip prostheses are seen in expected position. IMPRESSION: Negative. Electronically Signed   By: Marlaine Hind M.D.   On: 03/22/2020 21:41   DG Chest Portable 1 View  Result Date:  03/22/2020 CLINICAL DATA:  Left arm pain. EXAM: PORTABLE CHEST 1 VIEW COMPARISON:  February 16, 2009. FINDINGS: Stable cardiomegaly. No pneumothorax or pleural effusion is noted. Both lungs are clear. The visualized skeletal structures are unremarkable. IMPRESSION: No active disease. Electronically Signed   By: Marijo Conception M.D.   On: 03/22/2020 15:56   DG FEMUR MIN 2 VIEWS LEFT  Result Date: 03/22/2020 CLINICAL DATA:  Left flank pain. EXAM: LEFT FEMUR 2 VIEWS COMPARISON:  None. FINDINGS: Bipolar left hip prosthesis is seen in appropriate position. There is no evidence of fracture or dislocation. No other bone lesion seen involving the femur. Mild degenerative spurring is seen involving the knee joint. IMPRESSION: No acute findings. Mild left knee osteoarthritis and left hip prosthesis. Electronically Signed   By: Marlaine Hind M.D.   On: 03/22/2020 21:43      Subjective: Patient was seen and examined at bedside.  Overnight events noted.  Patient reports feeling better,  she has ambulated with slight limping but is stable to maintain balance.  Patient wants to be discharged.  Patient is cleared from orthopedics to be discharged.  Discharge Exam: Vitals:   03/23/20 2300 03/24/20 0627  BP: (!) 150/96 (!) 166/109  Pulse: 93 93  Resp: 17 17  Temp: 98.1 F (36.7 C) 98.2 F (36.8 C)  SpO2: 98% 98%   Vitals:   03/23/20 1546 03/23/20 2127 03/23/20 2300 03/24/20 0627  BP: (!) 159/96 (!) 143/89 (!) 150/96 (!) 166/109  Pulse: 84 87 93 93  Resp: 18 17 17 17   Temp: 97.7 F (36.5 C) 98 F (  36.7 C) 98.1 F (36.7 C) 98.2 F (36.8 C)  TempSrc:  Oral Oral Oral  SpO2: 98% 99% 98% 98%  Weight:      Height:        General: Pt is alert, awake, not in acute distress Cardiovascular: RRR, S1/S2 +, no rubs, no gallops Respiratory: CTA bilaterally, no wheezing, no rhonchi Abdominal: Soft, NT, ND, bowel sounds + Extremities: no edema, no cyanosis    The results of significant diagnostics from  this hospitalization (including imaging, microbiology, ancillary and laboratory) are listed below for reference.     Microbiology: Recent Results (from the past 240 hour(s))  SARS CORONAVIRUS 2 (TAT 6-24 HRS) Nasopharyngeal Nasopharyngeal Swab     Status: None   Collection Time: 03/22/20  8:32 PM   Specimen: Nasopharyngeal Swab  Result Value Ref Range Status   SARS Coronavirus 2 NEGATIVE NEGATIVE Final    Comment: (NOTE) SARS-CoV-2 target nucleic acids are NOT DETECTED.  The SARS-CoV-2 RNA is generally detectable in upper and lower respiratory specimens during the acute phase of infection. Negative results do not preclude SARS-CoV-2 infection, do not rule out co-infections with other pathogens, and should not be used as the sole basis for treatment or other patient management decisions. Negative results must be combined with clinical observations, patient history, and epidemiological information. The expected result is Negative.  Fact Sheet for Patients: SugarRoll.be  Fact Sheet for Healthcare Providers: https://www.woods-mathews.com/  This test is not yet approved or cleared by the [place] FDA and  has been authorized for detection and/or diagnosis of SARS-CoV-2 by FDA under an Emergency Use Authorization (EUA). This EUA will remain  in effect (meaning this test can be used) for the duration of the COVID-19 declaration under Se ction 564(b)(1) of the Act, 21 U.S.C. section 360bbb-3(b)(1), unless the authorization is terminated or revoked sooner.  Performed at [facility], [place] [address], [place], [place] [address]      Labs: BNP (last 3 results) No results for input(s): BNP in the last 8760 hours. Basic Metabolic Panel: Recent Labs  Lab 03/22/20 1421 03/23/20 0412 03/24/20 0619  NA 134* 138 136  K 4.0 3.6 4.0  CL 99 99 99  CO2 23 26 28   GLUCOSE 200* 279* 219*  BUN 14 16 17   CREATININE 0.94 1.02* 1.08*   CALCIUM 10.3 10.4* 10.4*  MG  --   --  1.7  PHOS  --   --  4.5   Liver Function Tests: Recent Labs  Lab 03/22/20 1421  AST 24  ALT 21  ALKPHOS 79  BILITOT 0.8  PROT 8.3*  ALBUMIN 4.5   No results for input(s): LIPASE, AMYLASE in the last 168 hours. No results for input(s): AMMONIA in the last 168 hours. CBC: Recent Labs  Lab 03/22/20 1421 03/23/20 0412  WBC 10.4 10.4  NEUTROABS 6.8  --   HGB 14.3 14.0  HCT 41.3 40.3  MCV 83.3 83.3  PLT 345 369   Cardiac Enzymes: No results for input(s): CKTOTAL, CKMB, CKMBINDEX, TROPONINI in the last 168 hours. BNP: Invalid input(s): POCBNP CBG: Recent Labs  Lab 03/23/20 1125 03/23/20 1145 03/23/20 1811 03/23/20 2129 03/24/20 0814  GLUCAP 211* 196* 259* 236* 257*   D-Dimer No results for input(s): DDIMER in the last 72 hours. Hgb A1c Recent Labs    03/22/20 1421  HGBA1C 9.3*   Lipid Profile No results for input(s): CHOL, HDL, LDLCALC, TRIG, CHOLHDL, LDLDIRECT in the last 72 hours. Thyroid function studies No  results for input(s): TSH, T4TOTAL, T3FREE, THYROIDAB in the last 72 hours.  Invalid input(s): FREET3 Anemia work up No results for input(s): VITAMINB12, FOLATE, FERRITIN, TIBC, IRON, RETICCTPCT in the last 72 hours. Urinalysis    Component Value Date/Time   COLORURINE YELLOW (A) 06/23/2017 1349   APPEARANCEUR CLEAR (A) 06/23/2017 1349   LABSPEC 1.025 06/23/2017 1349   PHURINE 5.0 06/23/2017 1349   GLUCOSEU NEGATIVE 06/23/2017 1349   HGBUR NEGATIVE 06/23/2017 1349   BILIRUBINUR NEGATIVE 06/23/2017 1349   BILIRUBINUR neg 08/14/2014 0842   KETONESUR NEGATIVE 06/23/2017 1349   PROTEINUR 100 (A) 06/23/2017 1349   UROBILINOGEN 0.2 08/14/2014 0842   NITRITE NEGATIVE 06/23/2017 1349   LEUKOCYTESUR NEGATIVE 06/23/2017 1349   Sepsis Labs Invalid input(s): PROCALCITONIN,  WBC,  LACTICIDVEN Microbiology Recent Results (from the past 240 hour(s))  SARS CORONAVIRUS 2 (TAT 6-24 HRS) Nasopharyngeal Nasopharyngeal  Swab     Status: None   Collection Time: 03/22/20  8:32 PM   Specimen: Nasopharyngeal Swab  Result Value Ref Range Status   SARS Coronavirus 2 NEGATIVE NEGATIVE Final    Comment: (NOTE) SARS-CoV-2 target nucleic acids are NOT DETECTED.  The SARS-CoV-2 RNA is generally detectable in upper and lower respiratory specimens during the acute phase of infection. Negative results do not preclude SARS-CoV-2 infection, do not rule out co-infections with other pathogens, and should not be used as the sole basis for treatment or other patient management decisions. Negative results must be combined with clinical observations, patient history, and epidemiological information. The expected result is Negative.  Fact Sheet for Patients: SugarRoll.be  Fact Sheet for Healthcare Providers: https://www.woods-mathews.com/  This test is not yet approved or cleared by the [place] FDA and  has been authorized for detection and/or diagnosis of SARS-CoV-2 by FDA under an Emergency Use Authorization (EUA). This EUA will remain  in effect (meaning this test can be used) for the duration of the COVID-19 declaration under Se ction 564(b)(1) of the Act, 21 U.S.C. section 360bbb-3(b)(1), unless the authorization is terminated or revoked sooner.  Performed at [facility], [place] [address] [place], [place] [address]      Time coordinating discharge: Over 30 minutes  SIGNED:   Shawna Clamp, MD  [facility] 03/24/2020, 10:52 AM Pager   If 7PM-7AM, please contact night-coverage [email or web address]

## 2020-03-24 NOTE — Plan of Care (Signed)
Pt Axox4. Calm and cooperative and able to voice her needs. Pt sitting on a chair and c/o of left leg pain. Tramadol prn administered per MD orders. Scheduled BP meds and blood sugar checked and covered. Pt had bowel movements twice this am. Pt husband at bedside and attentive to care. Pt impatient to go home. Pt started on po steroids and Neurontin. Discharge orders written and discharge instructions given. Pt gathered all belongings and IV left hand removed. Pt left via wheelchair. Husband following with belongings.    Problem: Education: Goal: Knowledge of General Education information will improve Description: Including pain rating scale, medication(s)/side effects and non-pharmacologic comfort measures Outcome: Progressing   Problem: Health Behavior/Discharge Planning: Goal: Ability to manage health-related needs will improve Outcome: Progressing   Problem: Clinical Measurements: Goal: Ability to maintain clinical measurements within normal limits will improve Outcome: Progressing Goal: Will remain free from infection Outcome: Progressing Goal: Diagnostic test results will improve Outcome: Progressing Goal: Respiratory complications will improve Outcome: Progressing Goal: Cardiovascular complication will be avoided Outcome: Progressing   Problem: Activity: Goal: Risk for activity intolerance will decrease Outcome: Progressing   Problem: Nutrition: Goal: Adequate nutrition will be maintained Outcome: Progressing   Problem: Coping: Goal: Level of anxiety will decrease Outcome: Progressing   Problem: Elimination: Goal: Will not experience complications related to bowel motility Outcome: Progressing Goal: Will not experience complications related to urinary retention Outcome: Progressing   Problem: Pain Managment: Goal: General experience of comfort will improve Outcome: Progressing   Problem: Safety: Goal: Ability to remain free from injury will improve Outcome:  Progressing   Problem: Skin Integrity: Goal: Risk for impaired skin integrity will decrease Outcome: Progressing

## 2020-03-24 NOTE — Discharge Instructions (Signed)
Advised to follow up with PCP  in one week. Advised to follow up Neurosurgery Dr. Lacinda Axon as scheduled. Advised to follow up Orthopaedics as scheduled. Advised to take Gabapentin 300 mg PO TID and medrol pack as directed.

## 2020-03-25 ENCOUNTER — Other Ambulatory Visit: Admission: RE | Admit: 2020-03-25 | Payer: Managed Care, Other (non HMO) | Source: Ambulatory Visit

## 2020-03-26 ENCOUNTER — Encounter: Admission: RE | Payer: Self-pay | Source: Home / Self Care

## 2020-03-26 ENCOUNTER — Inpatient Hospital Stay
Admission: RE | Admit: 2020-03-26 | Payer: Managed Care, Other (non HMO) | Source: Home / Self Care | Admitting: Orthopedic Surgery

## 2020-03-26 SURGERY — TOTAL HIP REVISION
Anesthesia: Choice | Site: Hip | Laterality: Left

## 2020-04-11 ENCOUNTER — Other Ambulatory Visit: Payer: Self-pay | Admitting: Orthopedic Surgery

## 2020-04-24 ENCOUNTER — Other Ambulatory Visit: Payer: Self-pay | Admitting: Neurosurgery

## 2020-04-26 ENCOUNTER — Encounter
Admission: RE | Admit: 2020-04-26 | Discharge: 2020-04-26 | Disposition: A | Discharge status: Home / Self Care | Payer: Managed Care, Other (non HMO) | Source: Ambulatory Visit | Attending: Neurosurgery | Admitting: Neurosurgery

## 2020-04-26 ENCOUNTER — Other Ambulatory Visit: Payer: Self-pay

## 2020-04-26 DIAGNOSIS — Z01812 Encounter for preprocedural laboratory examination: Secondary | ICD-10-CM | POA: Insufficient documentation

## 2020-04-26 DIAGNOSIS — Z20822 Contact with and (suspected) exposure to covid-19: Secondary | ICD-10-CM | POA: Diagnosis not present

## 2020-04-26 LAB — CBC WITH DIFFERENTIAL/PLATELET
Abs Immature Granulocytes: 0.03 10*3/uL (ref 0.00–0.07)
Basophils Absolute: 0 10*3/uL (ref 0.0–0.1)
Basophils Relative: 0 %
Eosinophils Absolute: 0.3 10*3/uL (ref 0.0–0.5)
Eosinophils Relative: 3 %
HCT: 41.5 % (ref 36.0–46.0)
Hemoglobin: 14.1 g/dL (ref 12.0–15.0)
Immature Granulocytes: 0 %
Lymphocytes Relative: 24 %
Lymphs Abs: 2.5 10*3/uL (ref 0.7–4.0)
MCH: 28.2 pg (ref 26.0–34.0)
MCHC: 34 g/dL (ref 30.0–36.0)
MCV: 83 fL (ref 80.0–100.0)
Monocytes Absolute: 0.5 10*3/uL (ref 0.1–1.0)
Monocytes Relative: 4 %
Neutro Abs: 7.2 10*3/uL (ref 1.7–7.7)
Neutrophils Relative %: 69 %
Platelets: 350 10*3/uL (ref 150–400)
RBC: 5 MIL/uL (ref 3.87–5.11)
RDW: 13.5 % (ref 11.5–15.5)
WBC: 10.5 10*3/uL (ref 4.0–10.5)
nRBC: 0 % (ref 0.0–0.2)

## 2020-04-26 LAB — URINALYSIS, ROUTINE W REFLEX MICROSCOPIC
Bacteria, UA: NONE SEEN
Bilirubin Urine: NEGATIVE
Glucose, UA: NEGATIVE mg/dL
Hgb urine dipstick: NEGATIVE
Ketones, ur: NEGATIVE mg/dL
Leukocytes,Ua: NEGATIVE
Nitrite: NEGATIVE
Protein, ur: 100 mg/dL — AB
Specific Gravity, Urine: 1.02 (ref 1.005–1.030)
pH: 5 (ref 5.0–8.0)

## 2020-04-26 LAB — COMPREHENSIVE METABOLIC PANEL
ALT: 22 U/L (ref 0–44)
AST: 18 U/L (ref 15–41)
Albumin: 4.4 g/dL (ref 3.5–5.0)
Alkaline Phosphatase: 79 U/L (ref 38–126)
Anion gap: 11 (ref 5–15)
BUN: 14 mg/dL (ref 6–20)
CO2: 26 mmol/L (ref 22–32)
Calcium: 10.6 mg/dL — ABNORMAL HIGH (ref 8.9–10.3)
Chloride: 100 mmol/L (ref 98–111)
Creatinine, Ser: 1.03 mg/dL — ABNORMAL HIGH (ref 0.44–1.00)
GFR, Estimated: >60 mL/min (ref >60)
Glucose, Bld: 148 mg/dL — ABNORMAL HIGH (ref 70–99)
Potassium: 3.2 mmol/L — ABNORMAL LOW (ref 3.5–5.1)
Sodium: 137 mmol/L (ref 135–145)
Total Bilirubin: 0.8 mg/dL (ref 0.3–1.2)
Total Protein: 8.2 g/dL — ABNORMAL HIGH (ref 6.5–8.1)

## 2020-04-26 LAB — SURGICAL PCR SCREEN
MRSA, PCR: NEGATIVE
Staphylococcus aureus: POSITIVE — AB

## 2020-04-26 LAB — SARS CORONAVIRUS 2 (TAT 6-24 HRS): SARS Coronavirus 2: NEGATIVE

## 2020-04-26 NOTE — Patient Instructions (Signed)
Your procedure is scheduled on: 04/29/20 and 05/03/20 Report to [place] must also stop at Admitting on 1st floor. To find out your arrival time please call [phone] between 1PM - 3PM on 04/26/20 and 05/02/20.  Remember: Instructions that are not followed completely may result in serious medical risk, up to and including death, or upon the discretion of your surgeon and anesthesiologist your surgery may need to be rescheduled.     _X__ 1. Do not eat food after midnight the night before your procedure.                 No gum chewing or hard candies.                  . Diabetics water only  __X__2.  On the morning of surgery brush your teeth with toothpaste and water, you                 may rinse your mouth with mouthwash if you wish.  Do not swallow any              toothpaste of mouthwash.     _X__ 3.  No Alcohol for 24 hours before or after surgery.   _X__ 4.  Do Not Smoke or use e-cigarettes For 24 Hours Prior to Your Surgery.                 Do not use any chewable tobacco products for at least 6 hours prior to                 surgery.  ____  5.  Bring all medications with you on the day of surgery if instructed.   __X__  6.  Notify your doctor if there is any change in your medical condition      (cold, fever, infections).     Do not wear jewelry, make-up, hairpins, clips or nail polish. Do not wear lotions, powders, deodorant or perfumes.  Do not shave 48 hours prior to surgery. Men may shave face and neck. Do not bring valuables to the hospital.    [facility] is not responsible for any belongings or valuables.  Contacts, dentures/partials or body piercings may not be worn into surgery. Bring a case for your contacts, glasses or hearing aids, a denture cup will be supplied. Leave your suitcase in the car. After surgery it may be brought to your room. For patients admitted to the hospital, discharge time is determined by  your treatment team.   Patients discharged the day of surgery will not be allowed to drive home.   Please read over the following fact sheets that you were given:   MRSA Information, chg, incentive spirometer  __X__ Take these medicines the morning of surgery with A SIP OF WATER:    1. gabapentin (NEURONTIN) 300 MG tablet  2. hydrALAZINE (APRESOLINE) 10 MG tablet  3. metoprolol succinate (TOPROL-XL) 25 MG 24 hr tablet  4. May take traMADol (ULTRAM) 50 MG tablet if needed  5.  6.  ____ Fleet Enema (as directed)   __X__ Use CHG Soap/SAGE wipes as directed  ____ Use inhalers on the day of surgery  __X__ Stop metformin/Janumet/Farxiga 2 days prior to surgery HOLD Saturday AND Sunday THEN Wednesday AND Thursday  YOU MAY RESTART Monday EVENING THEN STOP AGAIN ON Wednesday   ____ Take 1/2 of usual insulin dose the night before surgery. No insulin the  morning          of surgery.   ____ Stop Blood Thinners Coumadin/Plavix/Xarelto/Pleta/Pradaxa/Eliquis/Effient/Aspirin  on   Or contact your Surgeon, Cardiologist or Medical Doctor regarding  ability to stop your blood thinners  __X__ Stop Anti-inflammatories 7 days before surgery such as Advil, Ibuprofen, Motrin,  BC or Goodies Powder, Naprosyn, Naproxen, Aleve, Aspirin   HOLD MELOXICAM STARTING TODAY UNTIL AFTER 05/03/20  __X__ Stop all herbal supplements, fish oil or vitamin E until after surgery.    ____ Bring C-Pap to the hospital.

## 2020-04-28 MED ORDER — ORAL CARE MOUTH RINSE: 15.0000 mL | Freq: Once | OROMUCOSAL | Status: AC

## 2020-04-28 MED ORDER — CHLORHEXIDINE GLUCONATE 0.12 % MT SOLN: 15.0000 mL | Freq: Once | OROMUCOSAL | Status: AC

## 2020-04-28 MED ORDER — SODIUM CHLORIDE 0.9 % IV SOLN: INTRAVENOUS | Status: DC

## 2020-04-28 MED ORDER — FAMOTIDINE 20 MG PO TABS: 20.0000 mg | ORAL_TABLET | Freq: Once | ORAL | Status: AC

## 2020-04-28 MED ORDER — CEFAZOLIN SODIUM-DEXTROSE 2-4 GM/100ML-% IV SOLN
2.0000 g | INTRAVENOUS | Status: AC
  Administered 2020-04-29: 2 g via INTRAVENOUS

## 2020-04-28 NOTE — Progress Notes (Signed)
Pharmacy Antibiotic Note  Claire Hansen is a 60 y.o. female admitted on (Not on file) with surgical prophylaxis.  Pharmacy has been consulted for Cefazolin dosing.  TBW = 108.9 kg   Plan: Cefazolin 2 gm IV X 1 60 min pre-op ordered for 4/4 @ 0500.      No data recorded.  Recent Labs  Lab 04/26/20 0951  WBC 10.5  CREATININE 1.03*    Estimated Creatinine Clearance: 73.1 mL/min (A) (by C-G formula based on SCr of 1.03 mg/dL (H)).    Allergies  Allergen Reactions  . Shellfish Allergy Anaphylaxis and Nausea And Vomiting    Topical betadine is OK to use.    Antimicrobials this admission:   >>    >>   Dose adjustments this admission:   Microbiology results:  BCx:   UCx:    Sputum:    MRSA PCR:   Thank you for allowing pharmacy to be a part of this patient's care.  Taela Charbonneau D 04/28/2020 11:29 PM

## 2020-04-29 ENCOUNTER — Ambulatory Visit: Payer: Managed Care, Other (non HMO) | Admitting: Certified Registered Nurse Anesthetist

## 2020-04-29 ENCOUNTER — Ambulatory Visit
Admission: RE | Admit: 2020-04-29 | Discharge: 2020-04-29 | Disposition: A | Discharge status: Home / Self Care | Payer: Managed Care, Other (non HMO) | Attending: Neurosurgery | Admitting: Neurosurgery

## 2020-04-29 ENCOUNTER — Encounter
Admission: RE | Disposition: A | Discharge status: Home / Self Care | Payer: Self-pay | Source: Home / Self Care | Attending: Neurosurgery

## 2020-04-29 ENCOUNTER — Encounter: Payer: Self-pay | Admitting: Neurosurgery

## 2020-04-29 DIAGNOSIS — Z791 Long term (current) use of non-steroidal anti-inflammatories (NSAID): Secondary | ICD-10-CM | POA: Insufficient documentation

## 2020-04-29 DIAGNOSIS — G5732 Lesion of lateral popliteal nerve, left lower limb: Secondary | ICD-10-CM | POA: Insufficient documentation

## 2020-04-29 DIAGNOSIS — E1142 Type 2 diabetes mellitus with diabetic polyneuropathy: Secondary | ICD-10-CM | POA: Diagnosis not present

## 2020-04-29 DIAGNOSIS — Z91013 Allergy to seafood: Secondary | ICD-10-CM | POA: Insufficient documentation

## 2020-04-29 DIAGNOSIS — Z87892 Personal history of anaphylaxis: Secondary | ICD-10-CM | POA: Insufficient documentation

## 2020-04-29 DIAGNOSIS — Z79899 Other long term (current) drug therapy: Secondary | ICD-10-CM | POA: Diagnosis not present

## 2020-04-29 DIAGNOSIS — Z7984 Long term (current) use of oral hypoglycemic drugs: Secondary | ICD-10-CM | POA: Diagnosis not present

## 2020-04-29 HISTORY — PX: PERONEAL NERVE DECOMPRESSION: SHX2226

## 2020-04-29 LAB — GLUCOSE, CAPILLARY
Glucose-Capillary: 191 mg/dL — ABNORMAL HIGH (ref 70–99)
Glucose-Capillary: 210 mg/dL — ABNORMAL HIGH (ref 70–99)

## 2020-04-29 SURGERY — PERONEAL NERVE DECOMPRESSION
Anesthesia: General | Laterality: Left

## 2020-04-29 MED ORDER — PHENYLEPHRINE HCL (PRESSORS) 10 MG/ML IV SOLN
INTRAVENOUS | Status: DC | PRN
  Administered 2020-04-29 (×2): 200 ug via INTRAVENOUS

## 2020-04-29 MED ORDER — FENTANYL CITRATE (PF) 100 MCG/2ML IJ SOLN
INTRAMUSCULAR | Status: AC
  Filled 2020-04-29: qty 2

## 2020-04-29 MED ORDER — ACETAMINOPHEN 10 MG/ML IV SOLN
INTRAVENOUS | Status: AC
  Filled 2020-04-29: qty 100

## 2020-04-29 MED ORDER — CHLORHEXIDINE GLUCONATE 0.12 % MT SOLN
OROMUCOSAL | Status: AC
  Administered 2020-04-29: 15 mL via OROMUCOSAL
  Filled 2020-04-29: qty 15

## 2020-04-29 MED ORDER — PROPOFOL 10 MG/ML IV BOLUS
INTRAVENOUS | Status: DC | PRN
  Administered 2020-04-29: 150 mg via INTRAVENOUS

## 2020-04-29 MED ORDER — VASOPRESSIN 20 UNIT/ML IV SOLN
INTRAVENOUS | Status: AC
  Filled 2020-04-29: qty 1

## 2020-04-29 MED ORDER — CEFAZOLIN SODIUM-DEXTROSE 2-4 GM/100ML-% IV SOLN
INTRAVENOUS | Status: AC
  Filled 2020-04-29: qty 100

## 2020-04-29 MED ORDER — VASOPRESSIN 20 UNIT/ML IV SOLN
INTRAVENOUS | Status: DC | PRN
  Administered 2020-04-29: 2 [IU] via INTRAVENOUS

## 2020-04-29 MED ORDER — OXYCODONE HCL 5 MG/5ML PO SOLN
5.0000 mg | Freq: Once | ORAL | Status: DC | PRN
Start: 2020-04-29 — End: 2020-04-29

## 2020-04-29 MED ORDER — MIDAZOLAM HCL 2 MG/2ML IJ SOLN
INTRAMUSCULAR | Status: DC | PRN
  Administered 2020-04-29: 2 mg via INTRAVENOUS

## 2020-04-29 MED ORDER — ONDANSETRON HCL 4 MG/2ML IJ SOLN
INTRAMUSCULAR | Status: AC
  Filled 2020-04-29: qty 2

## 2020-04-29 MED ORDER — OXYCODONE HCL 5 MG PO TABS: 5.0000 mg | ORAL_TABLET | Freq: Once | ORAL | Status: DC | PRN

## 2020-04-29 MED ORDER — ACETAMINOPHEN 10 MG/ML IV SOLN
INTRAVENOUS | Status: DC | PRN
  Administered 2020-04-29: 1000 mg via INTRAVENOUS

## 2020-04-29 MED ORDER — EPHEDRINE 5 MG/ML INJ
INTRAVENOUS | Status: AC
  Filled 2020-04-29: qty 10

## 2020-04-29 MED ORDER — PROPOFOL 10 MG/ML IV BOLUS
INTRAVENOUS | Status: AC
  Filled 2020-04-29: qty 80

## 2020-04-29 MED ORDER — SUGAMMADEX SODIUM 200 MG/2ML IV SOLN
INTRAVENOUS | Status: DC | PRN
  Administered 2020-04-29: 400 mg via INTRAVENOUS

## 2020-04-29 MED ORDER — ROCURONIUM BROMIDE 10 MG/ML (PF) SYRINGE
PREFILLED_SYRINGE | INTRAVENOUS | Status: AC
  Filled 2020-04-29: qty 10

## 2020-04-29 MED ORDER — ONDANSETRON HCL 4 MG/2ML IJ SOLN
INTRAMUSCULAR | Status: DC | PRN
  Administered 2020-04-29: 4 mg via INTRAVENOUS

## 2020-04-29 MED ORDER — SUCCINYLCHOLINE CHLORIDE 200 MG/10ML IV SOSY
PREFILLED_SYRINGE | INTRAVENOUS | Status: AC
  Filled 2020-04-29: qty 10

## 2020-04-29 MED ORDER — FENTANYL CITRATE (PF) 100 MCG/2ML IJ SOLN
INTRAMUSCULAR | Status: DC | PRN
  Administered 2020-04-29 (×2): 50 ug via INTRAVENOUS

## 2020-04-29 MED ORDER — BUPIVACAINE HCL (PF) 0.5 % IJ SOLN
INTRAMUSCULAR | Status: DC | PRN
  Administered 2020-04-29: 6 mL

## 2020-04-29 MED ORDER — MEPERIDINE HCL 50 MG/ML IJ SOLN: 6.2500 mg | INTRAMUSCULAR | Status: DC | PRN

## 2020-04-29 MED ORDER — GLYCOPYRROLATE 0.2 MG/ML IJ SOLN
INTRAMUSCULAR | Status: AC
  Filled 2020-04-29: qty 1

## 2020-04-29 MED ORDER — PHENYLEPHRINE HCL (PRESSORS) 10 MG/ML IV SOLN
INTRAVENOUS | Status: AC
  Filled 2020-04-29: qty 1

## 2020-04-29 MED ORDER — TRAMADOL HCL 50 MG PO TABS: 50.0000 mg | ORAL_TABLET | Freq: Four times a day (QID) | ORAL | 0 refills | Status: DC | PRN

## 2020-04-29 MED ORDER — LIDOCAINE HCL (PF) 2 % IJ SOLN
INTRAMUSCULAR | Status: AC
  Filled 2020-04-29: qty 5

## 2020-04-29 MED ORDER — DEXAMETHASONE SODIUM PHOSPHATE 10 MG/ML IJ SOLN
INTRAMUSCULAR | Status: DC | PRN
  Administered 2020-04-29: 4 mg via INTRAVENOUS

## 2020-04-29 MED ORDER — FENTANYL CITRATE (PF) 100 MCG/2ML IJ SOLN: 25.0000 ug | INTRAMUSCULAR | Status: DC | PRN

## 2020-04-29 MED ORDER — MIDAZOLAM HCL 2 MG/2ML IJ SOLN
INTRAMUSCULAR | Status: AC
  Filled 2020-04-29: qty 2

## 2020-04-29 MED ORDER — LIDOCAINE HCL (CARDIAC) PF 100 MG/5ML IV SOSY
PREFILLED_SYRINGE | INTRAVENOUS | Status: DC | PRN
  Administered 2020-04-29: 100 mg via INTRAVENOUS

## 2020-04-29 MED ORDER — ROCURONIUM BROMIDE 100 MG/10ML IV SOLN
INTRAVENOUS | Status: DC | PRN
  Administered 2020-04-29: 40 mg via INTRAVENOUS

## 2020-04-29 MED ORDER — GLYCOPYRROLATE 0.2 MG/ML IJ SOLN
INTRAMUSCULAR | Status: DC | PRN
  Administered 2020-04-29: .2 mg via INTRAVENOUS

## 2020-04-29 MED ORDER — SUCCINYLCHOLINE CHLORIDE 20 MG/ML IJ SOLN
INTRAMUSCULAR | Status: DC | PRN
  Administered 2020-04-29: 110 mg via INTRAVENOUS

## 2020-04-29 MED ORDER — PROMETHAZINE HCL 25 MG/ML IJ SOLN: 6.2500 mg | INTRAMUSCULAR | Status: DC | PRN

## 2020-04-29 MED ORDER — DEXAMETHASONE SODIUM PHOSPHATE 10 MG/ML IJ SOLN
INTRAMUSCULAR | Status: AC
  Filled 2020-04-29: qty 1

## 2020-04-29 MED ORDER — EPHEDRINE SULFATE 50 MG/ML IJ SOLN
INTRAMUSCULAR | Status: DC | PRN
  Administered 2020-04-29 (×4): 10 mg via INTRAVENOUS

## 2020-04-29 MED ORDER — FAMOTIDINE 20 MG PO TABS
ORAL_TABLET | ORAL | Status: AC
  Administered 2020-04-29: 20 mg via ORAL
  Filled 2020-04-29: qty 1

## 2020-04-29 SURGICAL SUPPLY — 31 items
ADH SKN CLS APL DERMABOND .7 (GAUZE/BANDAGES/DRESSINGS) ×1
APL PRP STRL LF DISP 70% ISPRP (MISCELLANEOUS) ×1
CANISTER SUCT 1200ML W/VALVE (MISCELLANEOUS) IMPLANT
CHLORAPREP W/TINT 26 (MISCELLANEOUS) ×2 IMPLANT
CORD BIP STRL DISP 12FT (MISCELLANEOUS) ×2 IMPLANT
COVER WAND RF STERILE (DRAPES) ×2 IMPLANT
DERMABOND ADVANCED (GAUZE/BANDAGES/DRESSINGS) ×1
DERMABOND ADVANCED .7 DNX12 (GAUZE/BANDAGES/DRESSINGS) ×1 IMPLANT
DRAPE INCISE IOBAN 66X45 STRL (DRAPES) ×2 IMPLANT
DRAPE THYROID T SHEET (DRAPES) ×2 IMPLANT
DRSG TEGADERM 4X4.75 (GAUZE/BANDAGES/DRESSINGS) ×2 IMPLANT
DRSG TELFA 4X3 1S NADH ST (GAUZE/BANDAGES/DRESSINGS) ×2 IMPLANT
FORCEPS JEWEL BIP 4-3/4 STR (INSTRUMENTS) ×2 IMPLANT
GAUZE SPONGE 4X4 12PLY STRL (GAUZE/BANDAGES/DRESSINGS) ×2 IMPLANT
GLOVE SRG 8 PF TXTR STRL LF DI (GLOVE) ×1 IMPLANT
GLOVE SURG SYN 7.0 (GLOVE) IMPLANT
GLOVE SURG SYN 8.0 (GLOVE) ×2 IMPLANT
GLOVE SURG UNDER POLY LF SZ7 (GLOVE) IMPLANT
GLOVE SURG UNDER POLY LF SZ8 (GLOVE) ×2
GOWN STRL REUS W/ TWL XL LVL3 (GOWN DISPOSABLE) ×2 IMPLANT
GOWN STRL REUS W/TWL XL LVL3 (GOWN DISPOSABLE) ×4
KIT TURNOVER KIT A (KITS) ×2 IMPLANT
MANIFOLD NEPTUNE II (INSTRUMENTS) ×2 IMPLANT
NEEDLE HYPO 22GX1.5 SAFETY (NEEDLE) ×2 IMPLANT
NS IRRIG 500ML POUR BTL (IV SOLUTION) ×2 IMPLANT
PACK EXTREMITY ARMC (MISCELLANEOUS) ×2 IMPLANT
SUT MNCRL AB 3-0 PS2 27 (SUTURE) ×2 IMPLANT
SUT VIC AB 2-0 SH 27 (SUTURE) ×4
SUT VIC AB 2-0 SH 27XBRD (SUTURE) ×2 IMPLANT
SYR 10ML LL (SYRINGE) ×2 IMPLANT
TAPE CLOTH 3X10 WHT NS LF (GAUZE/BANDAGES/DRESSINGS) ×2 IMPLANT

## 2020-04-29 NOTE — Discharge Instructions (Addendum)
NEUROSURGERY DISCHARGE INSTRUCTIONS  Admission diagnosis: weakness of left foot r29.898  Operative procedure: Left peroneal Nerve Decompression  What to do after you leave the hospital:  Recommended diet: diabetic diet. Increase protein intake to promote wound healing.  Recommended activity: no lifting or strenuous exercise for 2 weeks. It is Ok to walk and bend leg but avoid lifting with leg and repeated bending.   Special Instructions  Keep incision area clean and dry. May shower in 2 days. No baths or pools for 6 weeks.  Please remove dressing tomorrow, no need to apply a bandage afterwards You have no sutures to remove, the skin is closed with adhesive  Please take pain medications as directed. Take a stool softener if on pain medications A refill of Tramadol has been provided, only take if Tylenol not working  Please Report any of the following: Nausea or Vomiting, Temperature is greater than 101.68F (38.1C) degrees, Dizziness, Abdominal Pain, Difficulty Breathing or Shortness of Breath, Inability to Eat, drink Fluids, or Take medications, Bleeding, swelling, or drainage from surgical incision sites, New numbness or weakness, and Bowel or bladder dysfunction to the neurosurgeon on call at [phone]  Additional Follow up appointments Please follow up with Dr Lacinda Axon in [place] clinic as scheduled in 2-3 weeks   Please see below for scheduled appointments:  Future Appointments  Date Time Provider [place]  05/01/2020  8:30 AM [facility] None     AMBULATORY SURGERY  DISCHARGE INSTRUCTIONS   1) The drugs that you were given will stay in your system until tomorrow so for the next 24 hours you should not:  A) Drive an automobile B) Make any legal decisions C) Drink any alcoholic beverage   2) You may resume regular meals tomorrow.  Today it is better to start with liquids and gradually work up to solid foods.  You may eat anything you prefer, but  it is better to start with liquids, then soup and crackers, and gradually work up to solid foods.   3) Please notify your doctor immediately if you have any unusual bleeding, trouble breathing, redness and pain at the surgery site, drainage, fever, or pain not relieved by medication.    4) Additional Instructions:        Please contact your physician with any problems or Same Day Surgery at [phone], Monday through Friday 6 am to 4 pm, or [place] at [facility] number at [phone].

## 2020-04-29 NOTE — Transfer of Care (Signed)
Immediate Anesthesia Transfer of Care Note  Patient: Claire Hansen  Procedure(s) Performed: PERONEAL NERVE DECOMPRESSION (Left )  Patient Location: PACU  Anesthesia Type:General  Level of Consciousness: awake, drowsy and patient cooperative  Airway & Oxygen Therapy: Patient Spontanous Breathing and Patient connected to face mask oxygen  Post-op Assessment: Report given to RN and Post -op Vital signs reviewed and stable  Post vital signs: Reviewed and stable  Last Vitals:  Vitals Value Taken Time  BP 153/86 04/29/20 0847  Temp    Pulse 88 04/29/20 0850  Resp 27 04/29/20 0850  SpO2 100 % 04/29/20 0850  Vitals shown include unvalidated device data.  Last Pain:  Vitals:   04/29/20 0627  PainSc: 7          Complications: No complications documented.

## 2020-04-29 NOTE — OR Nursing (Signed)
Per Dr. Lacinda Axon, [place] chat, patient does not need to void prior to discharge.

## 2020-04-29 NOTE — Op Note (Signed)
Operative Note   SURGERY DATE: 04/29/2020  PRE-OP DIAGNOSIS:  Left peroneal neuropathy   POST-OP DIAGNOSIS: Post-Op Diagnosis Codes: Left peroneal neuropathy   Procedure(s) with comments: Left peroneal nerve decompression   SURGEON:     * Malen Gauze, MD     ANESTHESIA: General    OPERATIVE FINDINGS: Compression at the fibular head on the left, fatty infiltrate within the nerve   Indications Claire Hansen presented to our clinic on 03/26/2020 with ongoing left foot weakness and numbness.  MRI did not show any obvious concerning stenosis and she was sent for nerve study.  This did reveal a sensory neuropathy as well as compression of the peroneal nerve at the left knee..  We did discuss that her diabetes could also be contributing as her A1c was noted to be greater than 9. Given this, we did discuss a peroneal nerve decompression to allow for healing on the left.  We discussed the risk of hematoma, infection, nerve damage, risk of anesthesia.  She would like to proceed with the decompression.    Procedure After obtaining informed consent, the patient was taken to the Operating Room where general anesthesia was induced and the patient intubated. Vascular access was obtained.  The patient was positioned supine with the left side elevated in the left leg flexed.  The planned incision was marked centimeter below the fibular head on the left on the lateral side of the leg.  Antibiotics were given.  A timeout was performed.  The area was prepped and draped in a sterile fashion.  Local anesthetic was instilled to the planned incision.  An approximately 4-5 cm incision was made sharply down to the subcutaneous tissue.  There blunt dissection was used to dissect through the fascial layer and the nerve was identified in the crossed laterally around the fibula.  Hair was seen to be mildly widened with some fatty infiltrate.  He was followed proximally posteriorly to the knee where he was seen to be  free.  Next, it was followed distally as it entered underneath the musculature.  Muscle fascia was incised and retracted medially.  The deep fascia was then transected and the nerve was followed deep to this.  The ligament was further divided to ensure the nerve was without compression.  At this point, the nerve appeared free throughout the course.  Hemostasis was obtained.  The wound was irrigated profusely.  The incision was closed with 2-0 Vicryl followed by a running Monocryl suture in the subcutaneous tissue.  Dermabond was placed on the skin.  The patient was awoken from general anesthesia and seemed to be at her neurologic baseline. She was taken to the PACU for recovery.   ESTIMATED BLOOD LOSS:   10 cc   SPECIMENS None   IMPLANT None     I performed the case in its entirety without assistance   Deetta Perla, [place]

## 2020-04-29 NOTE — Anesthesia Procedure Notes (Signed)
Procedure Name: Intubation Date/Time: 04/29/2020 7:29 AM Performed by: Lowry Bowl, CRNA Pre-anesthesia Checklist: Patient identified, Emergency Drugs available, Suction available and Patient being monitored Patient Re-evaluated:Patient Re-evaluated prior to induction Oxygen Delivery Method: Circle system utilized Preoxygenation: Pre-oxygenation with 100% oxygen Induction Type: IV induction Laryngoscope Size: 3 and McGraph Grade View: Grade II Tube type: Oral Tube size: 7.0 mm Number of attempts: 1 Airway Equipment and Method: Video-laryngoscopy and Stylet Placement Confirmation: ETT inserted through vocal cords under direct vision,  positive ETCO2 and breath sounds checked- equal and bilateral Secured at: 21 cm Tube secured with: Tape Dental Injury: Teeth and Oropharynx as per pre-operative assessment

## 2020-04-29 NOTE — H&P (Signed)
Claire Hansen is an 60 y.o. female.   Chief Complaint: Left foot weakness HPI: Claire Hansen is here for evaluation of left foot weakness. She does state that she is been dealing with more left thigh pain for the past 3 months. She was evaluated by orthopedics and found to have dislodged hardware and need of a hip revision surgery. However, approximate 3 weeks ago she did notice some severe pain in the thigh which led to weakness in the left foot. She does describe some diminished sensation on the lateral side of the leg and foot. She has not had any significant symptoms in the right leg. The pain was so severe that it prompted an ED visit where she was given a steroid taper and gabapentin 300 mg 3 times daily. She states with this, she has noted a dramatic improvement in the leg pain. She continues to have the foot weakness. She has not been to any recent physical therapy. Her hip surgery was delayed given these new findings. An MRI of the lumbar spine was obtained which did not show severe stenosis. A nerve study was done revealing a left peroneal neuropathy  She does have a history of diabetes and her recent A1c was noted to be 9.3.     Past Medical History:  Diagnosis Date  . Allergy   . Anxiety   . Asthma    pt denies having asthma  . Chronic osteoarthritis    knees  . Deviated septum   . Diabetes mellitus without complication ([place])   . Hyperlipidemia   . Hypertension   . Irregular menstrual cycle   . Lymphadenopathy   . Obesity   . Peri-menopause   . Pneumonia 2015    Past Surgical History:  Procedure Laterality Date  . BREAST SURGERY Right    Cyst removed  . CESAREAN SECTION     x2  . CHOLECYSTECTOMY    . DILATION AND CURETTAGE OF UTERUS     Treatment of Metorrhagia and Endometrial Ablation  . TONSILLECTOMY    . TOTAL HIP ARTHROPLASTY Right 11/05/2016   Procedure: TOTAL HIP ARTHROPLASTY ANTERIOR APPROACH;  Surgeon: Hessie Knows, MD;  Location: [facility] ORS;  Service:  Orthopedics;  Laterality: Right;  . TOTAL HIP ARTHROPLASTY Left 07/01/2017   Procedure: TOTAL HIP ARTHROPLASTY ANTERIOR APPROACH;  Surgeon: Hessie Knows, MD;  Location: [facility] ORS;  Service: Orthopedics;  Laterality: Left;    Family History  Problem Relation Age of Onset  . Asthma Son    Social History:  reports that she has never smoked. She has never used smokeless tobacco. She reports that she does not drink alcohol and does not use drugs.  Allergies:  Allergies  Allergen Reactions  . Shellfish Allergy Anaphylaxis and Nausea And Vomiting    Topical betadine is OK to use.    Medications Prior to Admission  Medication Sig Dispense Refill  . acetaminophen (TYLENOL) 500 MG tablet Take 1,000 mg by mouth every 8 (eight) hours as needed for mild pain or headache.    . gabapentin (NEURONTIN) 600 MG tablet Take 0.5 tablets (300 mg total) by mouth 3 (three) times daily. 90 tablet 1  . hydrALAZINE (APRESOLINE) 10 MG tablet Take 10 mg by mouth daily.    . hydrocortisone cream 0.5 % Apply 1 application topically daily as needed for itching ([place]).    . meloxicam (MOBIC) 15 MG tablet Take 15 mg by mouth daily as needed for pain.    . metFORMIN (GLUCOPHAGE-XR) 500 MG 24  hr tablet TAKE 1 TABLET BY MOUTH  DAILY WITH BREAKFAST (Patient taking differently: Take 500 mg by mouth 2 (two) times daily.) 90 tablet 1  . metoprolol succinate (TOPROL-XL) 25 MG 24 hr tablet Take 25 mg by mouth daily.    . montelukast (SINGULAIR) 10 MG tablet Take 1 tablet (10 mg total) by mouth daily. (Patient taking differently: Take 10 mg by mouth daily as needed (allergies).) 90 tablet 2  . Olmesartan-Amlodipine-HCTZ 40-10-25 MG TABS Take 1 tablet by mouth daily. 90 tablet 3  . Semaglutide (RYBELSUS) 7 MG TABS Take 7 mg by mouth daily.    Marland Kitchen spironolactone (ALDACTONE) 25 MG tablet Take 25 mg by mouth daily.    . traMADol (ULTRAM) 50 MG tablet Take 1-2 tablets (50-100 mg total) by mouth every 4 (four) hours as needed. (Patient  taking differently: Take 50-100 mg by mouth 3 (three) times daily as needed for moderate pain or severe pain (severe pain).) 60 tablet 0  . Vitamin D, Ergocalciferol, (DRISDOL) 1.25 MG (50000 UNIT) CAPS capsule Take 50,000 Units by mouth every Monday.      Results for orders placed or performed during the hospital encounter of 04/29/20 (from the past 48 hour(s))  Glucose, capillary     Status: Abnormal   Collection Time: 04/29/20  6:31 AM  Result Value Ref Range   Glucose-Capillary 191 (H) 70 - 99 mg/dL    Comment: Glucose reference range applies only to samples taken after fasting for at least 8 hours.   No results found.  Review of Systems General ROS: Negative Psychological ROS: Negative Ophthalmic ROS: Negative ENT ROS: Negative Hematological and Lymphatic ROS: Negative  Endocrine ROS: Negative Respiratory ROS: Negative Cardiovascular ROS: Negative Gastrointestinal ROS: Negative Genito-Urinary ROS: Negative Musculoskeletal ROS: Positive for back and thigh pain Neurological ROS: Positive for left foot weakness, numbness Dermatological ROS: Negative   Blood pressure (!) 193/111, pulse 100, resp. rate 12, last menstrual period 06/04/2016, SpO2 99 %. Physical Exam  General appearance: Alert, cooperative, in no acute distress Head: Normocephalic, atraumatic Eyes: Normal, EOM intact Oropharynx: Moist without lesions CV: Regular rate and rhythm Pulm: Clear to auscultation Back: No tenderness to palpation Ext: No edema in LE bilaterally  Neurologic exam:  Mental status: alertness: alert, affect: normal Speech: fluent and clear Motor:strength symmetric 5/5 in bilateral hip abduction, knee extension, knee flexion, plantar flexion, She has 5-5 in bilateral hip abduction She is 5 out of 5 in dorsiflexion and hip flexion on the right, she is 2 out of 5 and hip flexion on the left and 0 out of 5 in dorsiflexion Sensory: Diminished sensation noted over the lateral leg and foot on  the left.  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     Assessment/Plan Left peroneal neuropathy  - Proceed with decompression today  Deetta Perla, MD 04/29/2020, 6:40 AM

## 2020-04-29 NOTE — Anesthesia Postprocedure Evaluation (Signed)
Anesthesia Post Note  Patient: Claire Hansen  Procedure(s) Performed: PERONEAL NERVE DECOMPRESSION (Left )  Patient location during evaluation: PACU Anesthesia Type: General Level of consciousness: awake and alert and oriented Pain management: pain level controlled Vital Signs Assessment: post-procedure vital signs reviewed and stable Respiratory status: spontaneous breathing, nonlabored ventilation and respiratory function stable Cardiovascular status: blood pressure returned to baseline and stable Postop Assessment: no signs of nausea or vomiting Anesthetic complications: no   No complications documented.   Last Vitals:  Vitals:   04/29/20 0923 04/29/20 0933  BP: 126/83 130/66  Pulse: 90 86  Resp: (!) 21 18  Temp: 36.4 C (!) 36.3 C  SpO2: 93% 94%    Last Pain:  Vitals:   04/29/20 0933  TempSrc: Temporal  PainSc: 0-No pain                 Deneen Slager

## 2020-04-29 NOTE — Interval H&P Note (Signed)
History and Physical Interval Note:  04/29/2020 6:44 AM  Claire Hansen  has presented today for surgery, with the diagnosis of weakness of left foot r29.898.  The various methods of treatment have been discussed with the patient and family. After consideration of risks, benefits and other options for treatment, the patient has consented to  Procedure(s): PERONEAL NERVE DECOMPRESSION (Left) as a surgical intervention.  The patient's history has been reviewed, patient examined, no change in status, stable for surgery.  I have reviewed the patient's chart and labs.  Questions were answered to the patient's satisfaction.     Deetta Perla

## 2020-04-29 NOTE — Anesthesia Preprocedure Evaluation (Signed)
Anesthesia Evaluation  Patient identified by MRN, date of birth, ID band Patient awake    Reviewed: Allergy & Precautions, NPO status , Patient's Chart, lab work & pertinent test results  History of Anesthesia Complications Negative for: history of anesthetic complications  Airway Mallampati: II  TM Distance: >3 FB Neck ROM: Full    Dental no notable dental hx.    Pulmonary asthma , neg sleep apnea,    breath sounds clear to auscultation- rhonchi (-) wheezing      Cardiovascular hypertension, Pt. on medications (-) CAD, (-) Past MI, (-) Cardiac Stents and (-) CABG  Rhythm:Regular Rate:Normal - Systolic murmurs and - Diastolic murmurs    Neuro/Psych neg Seizures Anxiety negative neurological ROS     GI/Hepatic negative GI ROS, Neg liver ROS,   Endo/Other  diabetes, Oral Hypoglycemic Agents  Renal/GU negative Renal ROS     Musculoskeletal  (+) Arthritis ,   Abdominal (+) + obese,   Peds  Hematology negative hematology ROS (+)   Anesthesia Other Findings Past Medical History: No date: Allergy No date: Anxiety No date: Asthma     Comment:  pt denies having asthma No date: Chronic osteoarthritis     Comment:  knees No date: Deviated septum No date: Diabetes mellitus without complication ([facility]) No date: Hyperlipidemia No date: Hypertension No date: Irregular menstrual cycle No date: Lymphadenopathy No date: Obesity No date: Peri-menopause 2015: Pneumonia   Reproductive/Obstetrics                             Anesthesia Physical Anesthesia Plan  ASA: II  Anesthesia Plan: General   Post-op Pain Management:    Induction: Intravenous  PONV Risk Score and Plan: 2 and Ondansetron and Dexamethasone  Airway Management Planned: Oral ETT  Additional Equipment:   Intra-op Plan:   Post-operative Plan: Extubation in OR  Informed Consent: I have reviewed the patients History and  Physical, chart, labs and discussed the procedure including the risks, benefits and alternatives for the proposed anesthesia with the patient or authorized representative who has indicated his/her understanding and acceptance.     Dental advisory given  Plan Discussed with: CRNA and Anesthesiologist  Anesthesia Plan Comments:         Anesthesia Quick Evaluation

## 2020-04-30 ENCOUNTER — Encounter: Payer: Self-pay | Admitting: Neurosurgery

## 2020-04-30 NOTE — Progress Notes (Signed)
PATIENT DID NOT HAVE A NAME LISTED WITH VOICEMAIL, UNABLE TO LEAVE MESSAGE

## 2020-05-01 ENCOUNTER — Other Ambulatory Visit: Payer: Self-pay

## 2020-05-01 ENCOUNTER — Other Ambulatory Visit
Admission: RE | Admit: 2020-05-01 | Discharge: 2020-05-01 | Disposition: A | Discharge status: Home / Self Care | Payer: Managed Care, Other (non HMO) | Source: Ambulatory Visit | Attending: Orthopedic Surgery | Admitting: Orthopedic Surgery

## 2020-05-01 DIAGNOSIS — Z20822 Contact with and (suspected) exposure to covid-19: Secondary | ICD-10-CM | POA: Insufficient documentation

## 2020-05-01 DIAGNOSIS — Z01812 Encounter for preprocedural laboratory examination: Secondary | ICD-10-CM | POA: Insufficient documentation

## 2020-05-01 LAB — URINALYSIS, ROUTINE W REFLEX MICROSCOPIC
Bilirubin Urine: NEGATIVE
Glucose, UA: NEGATIVE mg/dL
Hgb urine dipstick: NEGATIVE
Ketones, ur: NEGATIVE mg/dL
Leukocytes,Ua: NEGATIVE
Nitrite: NEGATIVE
Protein, ur: 100 mg/dL — AB
Specific Gravity, Urine: 1.021 (ref 1.005–1.030)
pH: 5 (ref 5.0–8.0)

## 2020-05-01 LAB — TYPE AND SCREEN
ABO/RH(D): O POS
ABO/RH(D): O POS
Antibody Screen: NEGATIVE
Antibody Screen: NEGATIVE

## 2020-05-01 LAB — SARS CORONAVIRUS 2 (TAT 6-24 HRS): SARS Coronavirus 2: NEGATIVE

## 2020-05-03 ENCOUNTER — Encounter
Admission: RE | Disposition: A | Discharge status: Home Health | Payer: Self-pay | Source: Home / Self Care | Attending: Orthopedic Surgery

## 2020-05-03 ENCOUNTER — Inpatient Hospital Stay: Payer: Managed Care, Other (non HMO) | Admitting: Urgent Care

## 2020-05-03 ENCOUNTER — Inpatient Hospital Stay: Payer: Managed Care, Other (non HMO)

## 2020-05-03 ENCOUNTER — Encounter: Payer: Self-pay | Admitting: Orthopedic Surgery

## 2020-05-03 ENCOUNTER — Inpatient Hospital Stay
Admission: RE | Admit: 2020-05-03 | Discharge: 2020-05-07 | DRG: 467 | Disposition: A | Discharge status: Home Health | Payer: Managed Care, Other (non HMO) | Attending: Orthopedic Surgery | Admitting: Orthopedic Surgery

## 2020-05-03 ENCOUNTER — Other Ambulatory Visit: Payer: Self-pay

## 2020-05-03 DIAGNOSIS — Z20822 Contact with and (suspected) exposure to covid-19: Secondary | ICD-10-CM | POA: Diagnosis present

## 2020-05-03 DIAGNOSIS — E669 Obesity, unspecified: Secondary | ICD-10-CM | POA: Diagnosis present

## 2020-05-03 DIAGNOSIS — I10 Essential (primary) hypertension: Secondary | ICD-10-CM | POA: Diagnosis present

## 2020-05-03 DIAGNOSIS — Z6839 Body mass index (BMI) 39.0-39.9, adult: Secondary | ICD-10-CM | POA: Diagnosis not present

## 2020-05-03 DIAGNOSIS — M1990 Unspecified osteoarthritis, unspecified site: Secondary | ICD-10-CM | POA: Diagnosis present

## 2020-05-03 DIAGNOSIS — Z91013 Allergy to seafood: Secondary | ICD-10-CM | POA: Diagnosis not present

## 2020-05-03 DIAGNOSIS — Z79899 Other long term (current) drug therapy: Secondary | ICD-10-CM | POA: Diagnosis not present

## 2020-05-03 DIAGNOSIS — Z96649 Presence of unspecified artificial hip joint: Secondary | ICD-10-CM

## 2020-05-03 DIAGNOSIS — Z7982 Long term (current) use of aspirin: Secondary | ICD-10-CM

## 2020-05-03 DIAGNOSIS — Z419 Encounter for procedure for purposes other than remedying health state, unspecified: Secondary | ICD-10-CM

## 2020-05-03 DIAGNOSIS — Z7984 Long term (current) use of oral hypoglycemic drugs: Secondary | ICD-10-CM

## 2020-05-03 DIAGNOSIS — R000 Tachycardia, unspecified: Secondary | ICD-10-CM | POA: Diagnosis not present

## 2020-05-03 DIAGNOSIS — G8918 Other acute postprocedural pain: Secondary | ICD-10-CM

## 2020-05-03 DIAGNOSIS — Y793 Surgical instruments, materials and orthopedic devices (including sutures) associated with adverse incidents: Secondary | ICD-10-CM | POA: Diagnosis present

## 2020-05-03 DIAGNOSIS — T84031A Mechanical loosening of internal left hip prosthetic joint, initial encounter: Principal | ICD-10-CM | POA: Diagnosis present

## 2020-05-03 DIAGNOSIS — E785 Hyperlipidemia, unspecified: Secondary | ICD-10-CM | POA: Diagnosis present

## 2020-05-03 DIAGNOSIS — D62 Acute posthemorrhagic anemia: Secondary | ICD-10-CM | POA: Diagnosis not present

## 2020-05-03 DIAGNOSIS — E119 Type 2 diabetes mellitus without complications: Secondary | ICD-10-CM | POA: Diagnosis present

## 2020-05-03 HISTORY — DX: Presence of unspecified artificial hip joint: Z96.649

## 2020-05-03 HISTORY — PX: TOTAL HIP REVISION: SHX763

## 2020-05-03 LAB — CBC
HCT: 35.5 % — ABNORMAL LOW (ref 36.0–46.0)
Hemoglobin: 12 g/dL (ref 12.0–15.0)
MCH: 28.6 pg (ref 26.0–34.0)
MCHC: 33.8 g/dL (ref 30.0–36.0)
MCV: 84.7 fL (ref 80.0–100.0)
Platelets: 350 10*3/uL (ref 150–400)
RBC: 4.19 MIL/uL (ref 3.87–5.11)
RDW: 13.6 % (ref 11.5–15.5)
WBC: 20.9 10*3/uL — ABNORMAL HIGH (ref 4.0–10.5)
nRBC: 0 % (ref 0.0–0.2)

## 2020-05-03 LAB — GLUCOSE, CAPILLARY
Glucose-Capillary: 167 mg/dL — ABNORMAL HIGH (ref 70–99)
Glucose-Capillary: 164 mg/dL — ABNORMAL HIGH (ref 70–99)
Glucose-Capillary: 156 mg/dL — ABNORMAL HIGH (ref 70–99)
Glucose-Capillary: 228 mg/dL — ABNORMAL HIGH (ref 70–99)
Glucose-Capillary: 233 mg/dL — ABNORMAL HIGH (ref 70–99)

## 2020-05-03 LAB — CREATININE, SERUM
Creatinine, Ser: 1 mg/dL (ref 0.44–1.00)
GFR, Estimated: >60 mL/min (ref >60)

## 2020-05-03 SURGERY — TOTAL HIP REVISION
Anesthesia: Spinal | Site: Hip

## 2020-05-03 MED ORDER — ONDANSETRON HCL 4 MG PO TABS: 4.0000 mg | ORAL_TABLET | Freq: Four times a day (QID) | ORAL | Status: DC | PRN

## 2020-05-03 MED ORDER — OXYCODONE HCL 5 MG PO TABS
10.0000 mg | ORAL_TABLET | ORAL | Status: DC | PRN
  Administered 2020-05-03 – 2020-05-04 (×2): 10 mg via ORAL
  Filled 2020-05-03: qty 2

## 2020-05-03 MED ORDER — ENSURE MAX PROTEIN PO LIQD
11.0000 [oz_av] | Freq: Every day | ORAL | Status: DC
  Administered 2020-05-03 – 2020-05-06 (×2): 11 [oz_av] via ORAL
  Filled 2020-05-03: qty 330

## 2020-05-03 MED ORDER — ADULT MULTIVITAMIN W/MINERALS CH
1.0000 | ORAL_TABLET | Freq: Every day | ORAL | Status: DC
  Administered 2020-05-03 – 2020-05-07 (×5): 1 via ORAL
  Filled 2020-05-03 (×5): qty 1

## 2020-05-03 MED ORDER — METOCLOPRAMIDE HCL 10 MG PO TABS
5.0000 mg | ORAL_TABLET | Freq: Three times a day (TID) | ORAL | Status: DC | PRN
Start: 2020-05-03 — End: 2020-05-07

## 2020-05-03 MED ORDER — PROPOFOL 1000 MG/100ML IV EMUL
INTRAVENOUS | Status: AC
  Filled 2020-05-03: qty 100

## 2020-05-03 MED ORDER — ONDANSETRON HCL 4 MG/2ML IJ SOLN: 4.0000 mg | Freq: Four times a day (QID) | INTRAMUSCULAR | Status: DC | PRN

## 2020-05-03 MED ORDER — MIDAZOLAM HCL 5 MG/5ML IJ SOLN
INTRAMUSCULAR | Status: DC | PRN
  Administered 2020-05-03: 2 mg via INTRAVENOUS

## 2020-05-03 MED ORDER — BISACODYL 10 MG RE SUPP: 10.0000 mg | Freq: Every day | RECTAL | Status: DC | PRN

## 2020-05-03 MED ORDER — MENTHOL 3 MG MT LOZG
1.0000 | LOZENGE | OROMUCOSAL | Status: DC | PRN
  Filled 2020-05-03: qty 9

## 2020-05-03 MED ORDER — MAGNESIUM HYDROXIDE 400 MG/5ML PO SUSP: 30.0000 mL | Freq: Every day | ORAL | Status: DC | PRN

## 2020-05-03 MED ORDER — INSULIN ASPART 100 UNIT/ML ~~LOC~~ SOLN
0.0000 [IU] | Freq: Three times a day (TID) | SUBCUTANEOUS | Status: DC
  Administered 2020-05-03 – 2020-05-04 (×4): 5 [IU] via SUBCUTANEOUS
  Administered 2020-05-05: 3 [IU] via SUBCUTANEOUS
  Administered 2020-05-05: 5 [IU] via SUBCUTANEOUS
  Administered 2020-05-05 – 2020-05-06 (×2): 3 [IU] via SUBCUTANEOUS
  Administered 2020-05-06: 5 [IU] via SUBCUTANEOUS
  Administered 2020-05-06 – 2020-05-07 (×3): 3 [IU] via SUBCUTANEOUS
  Filled 2020-05-03 (×12): qty 1

## 2020-05-03 MED ORDER — PROMETHAZINE HCL 25 MG/ML IJ SOLN: 6.2500 mg | INTRAMUSCULAR | Status: DC | PRN

## 2020-05-03 MED ORDER — METHOCARBAMOL 500 MG PO TABS
500.0000 mg | ORAL_TABLET | Freq: Four times a day (QID) | ORAL | Status: DC | PRN
  Administered 2020-05-05: 500 mg via ORAL
  Filled 2020-05-03: qty 1

## 2020-05-03 MED ORDER — SEMAGLUTIDE 7 MG PO TABS: 7.0000 mg | ORAL_TABLET | Freq: Every day | ORAL | Status: DC

## 2020-05-03 MED ORDER — MAGNESIUM CITRATE PO SOLN
1.0000 | Freq: Once | ORAL | Status: DC | PRN
  Filled 2020-05-03: qty 296

## 2020-05-03 MED ORDER — ALUM & MAG HYDROXIDE-SIMETH 200-200-20 MG/5ML PO SUSP: 30.0000 mL | ORAL | Status: DC | PRN

## 2020-05-03 MED ORDER — IRBESARTAN 150 MG PO TABS
300.0000 mg | ORAL_TABLET | Freq: Every day | ORAL | Status: DC
  Administered 2020-05-05 – 2020-05-07 (×3): 300 mg via ORAL
  Filled 2020-05-03 (×4): qty 2

## 2020-05-03 MED ORDER — ACETAMINOPHEN 325 MG PO TABS: 325.0000 mg | ORAL_TABLET | Freq: Four times a day (QID) | ORAL | Status: DC | PRN

## 2020-05-03 MED ORDER — OLMESARTAN-AMLODIPINE-HCTZ 40-10-25 MG PO TABS: 1.0000 | ORAL_TABLET | Freq: Every day | ORAL | Status: DC

## 2020-05-03 MED ORDER — ORAL CARE MOUTH RINSE: 15.0000 mL | Freq: Once | OROMUCOSAL | Status: AC

## 2020-05-03 MED ORDER — HYDRALAZINE HCL 10 MG PO TABS
10.0000 mg | ORAL_TABLET | Freq: Every day | ORAL | Status: DC
  Administered 2020-05-05 – 2020-05-07 (×3): 10 mg via ORAL
  Filled 2020-05-03 (×4): qty 1

## 2020-05-03 MED ORDER — BUPIVACAINE HCL (PF) 0.5 % IJ SOLN
INTRAMUSCULAR | Status: DC | PRN
  Administered 2020-05-03: 3 mL via INTRATHECAL

## 2020-05-03 MED ORDER — TRAMADOL HCL 50 MG PO TABS
50.0000 mg | ORAL_TABLET | Freq: Four times a day (QID) | ORAL | Status: DC
  Administered 2020-05-03 – 2020-05-07 (×14): 50 mg via ORAL
  Filled 2020-05-03 (×15): qty 1

## 2020-05-03 MED ORDER — PHENOL 1.4 % MT LIQD
1.0000 | OROMUCOSAL | Status: DC | PRN
  Filled 2020-05-03: qty 177

## 2020-05-03 MED ORDER — ZOLPIDEM TARTRATE 5 MG PO TABS: 5.0000 mg | ORAL_TABLET | Freq: Every evening | ORAL | Status: DC | PRN

## 2020-05-03 MED ORDER — OXYCODONE HCL 5 MG PO TABS
5.0000 mg | ORAL_TABLET | ORAL | Status: DC | PRN
  Administered 2020-05-04 – 2020-05-07 (×5): 5 mg via ORAL
  Filled 2020-05-03 (×4): qty 1
  Filled 2020-05-03 (×2): qty 2
  Filled 2020-05-03: qty 1

## 2020-05-03 MED ORDER — GLUCERNA SHAKE PO LIQD
237.0000 mL | Freq: Two times a day (BID) | ORAL | Status: DC
  Administered 2020-05-04 – 2020-05-07 (×8): 237 mL via ORAL

## 2020-05-03 MED ORDER — ONDANSETRON HCL 4 MG/2ML IJ SOLN
INTRAMUSCULAR | Status: DC | PRN
  Administered 2020-05-03: 4 mg via INTRAVENOUS

## 2020-05-03 MED ORDER — PROPOFOL 500 MG/50ML IV EMUL
INTRAVENOUS | Status: DC | PRN
  Administered 2020-05-03: 75 ug/kg/min via INTRAVENOUS

## 2020-05-03 MED ORDER — METOCLOPRAMIDE HCL 5 MG/ML IJ SOLN: 5.0000 mg | Freq: Three times a day (TID) | INTRAMUSCULAR | Status: DC | PRN

## 2020-05-03 MED ORDER — SIMETHICONE 80 MG PO CHEW
80.0000 mg | CHEWABLE_TABLET | Freq: Four times a day (QID) | ORAL | Status: DC | PRN
  Administered 2020-05-04: 80 mg via ORAL
  Filled 2020-05-03 (×2): qty 1

## 2020-05-03 MED ORDER — MIDAZOLAM HCL 2 MG/2ML IJ SOLN
INTRAMUSCULAR | Status: AC
  Filled 2020-05-03: qty 2

## 2020-05-03 MED ORDER — HYDROMORPHONE HCL 1 MG/ML IJ SOLN: 0.5000 mg | INTRAMUSCULAR | Status: DC | PRN

## 2020-05-03 MED ORDER — GABAPENTIN 600 MG PO TABS
300.0000 mg | ORAL_TABLET | Freq: Three times a day (TID) | ORAL | Status: DC
  Administered 2020-05-03 – 2020-05-07 (×13): 300 mg via ORAL
  Filled 2020-05-03 (×13): qty 1

## 2020-05-03 MED ORDER — FENTANYL CITRATE (PF) 100 MCG/2ML IJ SOLN: 25.0000 ug | INTRAMUSCULAR | Status: DC | PRN

## 2020-05-03 MED ORDER — CHLORHEXIDINE GLUCONATE 0.12 % MT SOLN
15.0000 mL | Freq: Once | OROMUCOSAL | Status: AC
  Administered 2020-05-03: 15 mL via OROMUCOSAL

## 2020-05-03 MED ORDER — PROPOFOL 10 MG/ML IV BOLUS
INTRAVENOUS | Status: AC
  Filled 2020-05-03: qty 20

## 2020-05-03 MED ORDER — CHLORHEXIDINE GLUCONATE 0.12 % MT SOLN
OROMUCOSAL | Status: AC
  Filled 2020-05-03: qty 15

## 2020-05-03 MED ORDER — METHOCARBAMOL 1000 MG/10ML IJ SOLN
500.0000 mg | Freq: Four times a day (QID) | INTRAVENOUS | Status: DC | PRN
  Filled 2020-05-03: qty 5

## 2020-05-03 MED ORDER — BUPIVACAINE-EPINEPHRINE (PF) 0.25% -1:200000 IJ SOLN
INTRAMUSCULAR | Status: DC | PRN
  Administered 2020-05-03: 30 mL

## 2020-05-03 MED ORDER — ENOXAPARIN SODIUM 40 MG/0.4ML ~~LOC~~ SOLN
40.0000 mg | SUBCUTANEOUS | Status: DC
  Administered 2020-05-04 – 2020-05-07 (×4): 40 mg via SUBCUTANEOUS
  Filled 2020-05-03 (×4): qty 0.4

## 2020-05-03 MED ORDER — AMLODIPINE BESYLATE 10 MG PO TABS
10.0000 mg | ORAL_TABLET | Freq: Every day | ORAL | Status: DC
  Administered 2020-05-05 – 2020-05-07 (×3): 10 mg via ORAL
  Filled 2020-05-03 (×4): qty 1

## 2020-05-03 MED ORDER — DOCUSATE SODIUM 100 MG PO CAPS
100.0000 mg | ORAL_CAPSULE | Freq: Two times a day (BID) | ORAL | Status: DC
  Administered 2020-05-03 – 2020-05-07 (×8): 100 mg via ORAL
  Filled 2020-05-03 (×8): qty 1

## 2020-05-03 MED ORDER — CEFAZOLIN SODIUM-DEXTROSE 2-4 GM/100ML-% IV SOLN
2.0000 g | Freq: Four times a day (QID) | INTRAVENOUS | Status: AC
Start: 2020-05-03 — End: 2020-05-04
  Administered 2020-05-03 – 2020-05-04 (×3): 2 g via INTRAVENOUS
  Filled 2020-05-03 (×4): qty 100

## 2020-05-03 MED ORDER — ACETAMINOPHEN 500 MG PO TABS
1000.0000 mg | ORAL_TABLET | Freq: Four times a day (QID) | ORAL | Status: AC
  Administered 2020-05-03 – 2020-05-04 (×3): 1000 mg via ORAL
  Filled 2020-05-03 (×4): qty 2

## 2020-05-03 MED ORDER — PHENYLEPHRINE HCL (PRESSORS) 10 MG/ML IV SOLN
INTRAVENOUS | Status: DC | PRN
  Administered 2020-05-03 (×3): 50 ug via INTRAVENOUS

## 2020-05-03 MED ORDER — DIPHENHYDRAMINE HCL 12.5 MG/5ML PO ELIX: 12.5000 mg | ORAL_SOLUTION | ORAL | Status: DC | PRN

## 2020-05-03 MED ORDER — METOPROLOL SUCCINATE ER 25 MG PO TB24
25.0000 mg | ORAL_TABLET | Freq: Every day | ORAL | Status: DC
  Administered 2020-05-04 – 2020-05-07 (×4): 25 mg via ORAL
  Filled 2020-05-03 (×5): qty 1

## 2020-05-03 MED ORDER — PANTOPRAZOLE SODIUM 40 MG PO TBEC
40.0000 mg | DELAYED_RELEASE_TABLET | Freq: Every day | ORAL | Status: DC
  Administered 2020-05-03 – 2020-05-07 (×5): 40 mg via ORAL
  Filled 2020-05-03 (×5): qty 1

## 2020-05-03 MED ORDER — DEXTROSE 5 % IV SOLN
3.0000 g | INTRAVENOUS | Status: AC
  Administered 2020-05-03: 3 g via INTRAVENOUS
  Filled 2020-05-03: qty 30

## 2020-05-03 MED ORDER — FENTANYL CITRATE (PF) 100 MCG/2ML IJ SOLN
INTRAMUSCULAR | Status: AC
  Filled 2020-05-03: qty 2

## 2020-05-03 MED ORDER — HYDROCHLOROTHIAZIDE 25 MG PO TABS
25.0000 mg | ORAL_TABLET | Freq: Every day | ORAL | Status: DC
  Administered 2020-05-05 – 2020-05-07 (×3): 25 mg via ORAL
  Filled 2020-05-03 (×4): qty 1

## 2020-05-03 MED ORDER — MONTELUKAST SODIUM 10 MG PO TABS: 10.0000 mg | ORAL_TABLET | Freq: Every day | ORAL | Status: DC | PRN

## 2020-05-03 MED ORDER — METFORMIN HCL ER 500 MG PO TB24
500.0000 mg | ORAL_TABLET | Freq: Two times a day (BID) | ORAL | Status: DC
  Administered 2020-05-03 – 2020-05-07 (×8): 500 mg via ORAL
  Filled 2020-05-03 (×9): qty 1

## 2020-05-03 MED ORDER — DEXTROSE 5 % IV SOLN
3.0000 g | INTRAVENOUS | Status: DC
  Filled 2020-05-03: qty 3000

## 2020-05-03 MED ORDER — SODIUM CHLORIDE 0.9 % IV SOLN
INTRAVENOUS | Status: DC | PRN
  Administered 2020-05-03: 60 mL

## 2020-05-03 MED ORDER — SPIRONOLACTONE 25 MG PO TABS
25.0000 mg | ORAL_TABLET | Freq: Every day | ORAL | Status: DC
  Administered 2020-05-03 – 2020-05-07 (×4): 25 mg via ORAL
  Filled 2020-05-03 (×6): qty 1

## 2020-05-03 MED ORDER — FENTANYL CITRATE (PF) 100 MCG/2ML IJ SOLN
INTRAMUSCULAR | Status: DC | PRN
  Administered 2020-05-03: 25 ug via INTRAVENOUS
  Administered 2020-05-03: 50 ug via INTRAVENOUS
  Administered 2020-05-03: 25 ug via INTRAVENOUS

## 2020-05-03 MED ORDER — FAMOTIDINE 20 MG PO TABS: 20.0000 mg | ORAL_TABLET | Freq: Once | ORAL | Status: DC

## 2020-05-03 MED ORDER — SODIUM CHLORIDE 0.9 % IV SOLN: INTRAVENOUS | Status: DC

## 2020-05-03 SURGICAL SUPPLY — 53 items
APL PRP STRL LF DISP 70% ISPRP (MISCELLANEOUS) ×2
BLADE SAGITTAL WIDE XTHICK NO (BLADE) IMPLANT
CANISTER SUCT 1200ML W/VALVE (MISCELLANEOUS) ×3 IMPLANT
CANISTER SUCT 3000ML PPV (MISCELLANEOUS) ×6 IMPLANT
CANISTER WOUND CARE 500ML ATS (WOUND CARE) ×3 IMPLANT
CHLORAPREP W/TINT 26 (MISCELLANEOUS) ×3 IMPLANT
COVER BACK TABLE REUSABLE LG (DRAPES) ×3 IMPLANT
COVER WAND RF STERILE (DRAPES) ×3 IMPLANT
DRAPE 3/4 80X56 (DRAPES) ×6 IMPLANT
DRAPE C-ARMOR (DRAPES) IMPLANT
DRAPE INCISE IOBAN 66X60 STRL (DRAPES) ×6 IMPLANT
ELECT BLADE 6.5 EXT (BLADE) ×3 IMPLANT
ELECT CAUTERY BLADE 6.4 (BLADE) ×3 IMPLANT
ELECT REM PT RETURN 9FT ADLT (ELECTROSURGICAL) ×3
ELECTRODE REM PT RTRN 9FT ADLT (ELECTROSURGICAL) ×2 IMPLANT
GLOVE SURG ORTHO LTX SZ8 (GLOVE) ×3 IMPLANT
GLOVE SURG SYN 9.0  PF PI (GLOVE) ×2
GLOVE SURG SYN 9.0 PF PI (GLOVE) ×4 IMPLANT
GLOVE SURG UNDER LTX SZ8 (GLOVE) ×3 IMPLANT
GLOVE SURG UNDER POLY LF SZ9 (GLOVE) ×3 IMPLANT
GOWN STRL REUS W/ TWL LRG LVL3 (GOWN DISPOSABLE) ×2 IMPLANT
GOWN STRL REUS W/ TWL XL LVL3 (GOWN DISPOSABLE) ×2 IMPLANT
GOWN STRL REUS W/TWL LRG LVL3 (GOWN DISPOSABLE) ×3
GOWN STRL REUS W/TWL XL LVL3 (GOWN DISPOSABLE) ×3
HEAD FEMORAL 28MM SZ S (Head) ×3 IMPLANT
HEMOSTAT SURGICEL 2X3 (HEMOSTASIS) ×6 IMPLANT
HOLDER FOLEY CATH W/STRAP (MISCELLANEOUS) ×3 IMPLANT
HOOD PEEL AWAY FLYTE STAYCOOL (MISCELLANEOUS) ×6 IMPLANT
IV NS IRRIG 3000ML ARTHROMATIC (IV SOLUTION) ×3 IMPLANT
KIT PREVENA INCISION MGT 13 (CANNISTER) ×3 IMPLANT
KIT TURNOVER KIT A (KITS) ×3 IMPLANT
LINER DBL MOB SZ 0 52MM (Liner) ×3 IMPLANT
MANIFOLD NEPTUNE II (INSTRUMENTS) ×3 IMPLANT
NDL SAFETY ECLIPSE 18X1.5 (NEEDLE) ×2 IMPLANT
NEEDLE HYPO 18GX1.5 SHARP (NEEDLE) ×3
NS IRRIG 1000ML POUR BTL (IV SOLUTION) ×3 IMPLANT
OSTEOTOME THIN 10 3 (MISCELLANEOUS) ×3 IMPLANT
OSTEOTOME THIN 10 5 (MISCELLANEOUS) ×3 IMPLANT
OSTEOTOME THIN 8 5 (MISCELLANEOUS) ×3 IMPLANT
PACK HIP PROSTHESIS (MISCELLANEOUS) ×3 IMPLANT
PULSAVAC PLUS IRRIG FAN TIP (DISPOSABLE) ×3
STAPLER SKIN PROX 35W (STAPLE) ×3 IMPLANT
STEM FEM STD CEMENTLESS SZ5 (Stem) ×3 IMPLANT
SUT DVC 2 QUILL PDO  T11 36X36 (SUTURE) ×1
SUT DVC 2 QUILL PDO T11 36X36 (SUTURE) ×2 IMPLANT
SUT ETHIBOND #5 BRAIDED 30INL (SUTURE) IMPLANT
SUT V-LOC 90 ABS DVC 3-0 CL (SUTURE) ×6 IMPLANT
SUT VIC AB 1 CT1 36 (SUTURE) ×3 IMPLANT
SYR 20ML LL LF (SYRINGE) ×3 IMPLANT
TIP FAN IRRIG PULSAVAC PLUS (DISPOSABLE) ×2 IMPLANT
TIP IRRIG/SUCT HIGH CAPACITY (MISCELLANEOUS) ×3 IMPLANT
TOWEL OR 17X26 4PK STRL BLUE (TOWEL DISPOSABLE) ×3 IMPLANT
TRAY FOLEY MTR SLVR 16FR STAT (SET/KITS/TRAYS/PACK) ×3 IMPLANT

## 2020-05-03 NOTE — Evaluation (Signed)
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    Clinical Impression  Pt was pleasant and motivated to participate during the session and put forth good effort throughout the session.  Pt presented with significant LLE weakness and required mod A to manage her LLE with bed mobility tasks.  Pt was able to stand from an elevated surface with min A with good WB compliance and was able to take several small, effortful hop-to steps/shuffle steps to the [facility] and back again with good WB compliance.  Pt is very limited with her ability to ambulate overall, however, and has 40 steps to enter her 3rd floor apt.  Pt would be at high risk for LLE WB non-compliance among other safety concerns for herself and her caregivers if she were to attempt to return to her prior living situation at discharge.  Pt will benefit from PT services in a SNF setting upon discharge to safely address deficits listed in patient problem list for decreased caregiver assistance and eventual return to PLOF.     Follow Up Recommendations SNF;Supervision/Assistance - 24 hour    Equipment Recommendations  None recommended by PT    Recommendations for Other Services       Precautions / Restrictions Precautions Precautions: Fall;Anterior Hip Precaution Booklet Issued: Yes (comment) Restrictions Weight Bearing Restrictions: Yes LLE Weight Bearing: Touchdown weight bearing      Mobility  Bed Mobility Overal bed mobility: Needs Assistance Bed Mobility: Supine to Sit;Sit to Supine     Supine to sit: Mod assist Sit to supine: Mod assist   General bed  mobility comments: Mod A for LLE control with bed mobility tasks    Transfers Overall transfer level: Needs assistance Equipment used: Rolling walker (2 wheeled) Transfers: Sit to/from Stand Sit to Stand: Min assist;From elevated surface         General transfer comment: Mod multi-modal cues for sequencing for LLE WB compliance with pt's L foot placed on top of this PT's foot to ensure compliance; min A to advance the RW  Ambulation/Gait Ambulation/Gait assistance: Min assist Gait Distance (Feet): 2 Feet Assistive device: Rolling walker (2 wheeled) Gait Pattern/deviations: Step-to pattern Gait velocity: decreased   General Gait Details: Mod to max multi-modal cues for sequencing to ensure LLE WB compliance with pt able to take several small hop-to steps from bed to/from [facility]  Stairs            Wheelchair Mobility    Modified Rankin (Stroke Patients Only)       Balance Overall balance assessment: Needs assistance   Sitting balance-Leahy Scale: Normal     Standing balance support: Bilateral upper extremity supported;During functional activity Standing balance-Leahy Scale: Fair Standing balance comment: Heavy lean on the RW for support for LLE WB compliance but no LOB during functional tasks                             Pertinent Vitals/Pain Pain Assessment: No/denies pain    Home Living Family/patient expects to be discharged to:: Private residence Living Arrangements: Spouse/significant other;Children Available Help at Discharge: Family;Available 24  hours/day Type of Home: Apartment Home Access: Stairs to enter Entrance Stairs-Rails: Right;Left;Can reach both Entrance Stairs-Number of Steps: 40 (3rd floor with 2 x 20 steps to enter apt) Home Layout: One level Home Equipment: Walker - 2 wheels;Bedside commode      Prior Function Level of Independence: Independent         Comments: Ind amb community distances without an AD, no fall history,  Ind with bathing/dressing, assist with heavy household chores     Hand Dominance   Dominant Hand: Right    Extremity/Trunk Assessment   Upper Extremity Assessment Upper Extremity Assessment: Overall WFL for tasks assessed    Lower Extremity Assessment Lower Extremity Assessment: LLE deficits/detail;RLE deficits/detail RLE Deficits / Details: WNL RLE Sensation: WNL LLE Deficits / Details: BLE sensation to light touch intact with bilateral ankle AROM and strength at baseline (limited on the L secondary to peroneal nerve neuropathy s/p recent decompression); LLE hip flex <3/5, knee flex/ext >/= 3/5 LLE Sensation: WNL       Communication   Communication: No difficulties  Cognition Arousal/Alertness: Awake/alert Behavior During Therapy: WFL for tasks assessed/performed Overall Cognitive Status: Within Functional Limits for tasks assessed                                        General Comments      Exercises Total Joint Exercises Ankle Circles/Pumps: AROM;Strengthening;Both;10 reps (limited ROM on the L) Quad Sets: Strengthening;Both;10 reps Gluteal Sets: Strengthening;Both;10 reps Hip ABduction/ADduction: AAROM;Left;5 reps Straight Leg Raises: AAROM;Left;5 reps Long Arc Quad: AROM;Strengthening;Both;15 reps Knee Flexion: AROM;Strengthening;Both;15 reps Other Exercises Other Exercises: HEP education and review per handout Other Exercises: Extensive training/education on WB status and compliance with transfers from various height surfaces and during ambulation   Assessment/Plan    PT Assessment Patient needs continued PT services  PT Problem List Decreased strength;Decreased activity tolerance;Decreased balance;Decreased mobility;Decreased knowledge of use of DME;Decreased knowledge of precautions       PT Treatment Interventions DME instruction;Gait training;Stair training;Functional mobility training;Therapeutic activities;Therapeutic exercise;Balance  training;Patient/family education    PT Goals (Current goals can be found in the Care Plan section)  Acute Rehab PT Goals Patient Stated Goal: To walk like I could before PT Goal Formulation: With patient Time For Goal Achievement: 05/16/20 Potential to Achieve Goals: Good    Frequency BID   Barriers to discharge Inaccessible home environment      Co-evaluation               AM-PAC PT "6 Clicks" Mobility  Outcome Measure Help needed turning from your back to your side while in a flat bed without using bedrails?: A Lot Help needed moving from lying on your back to sitting on the side of a flat bed without using bedrails?: A Lot Help needed moving to and from a bed to a chair (including a wheelchair)?: A Lot Help needed standing up from a chair using your arms (e.g., wheelchair or bedside chair)?: A Little Help needed to walk in hospital room?: Total Help needed climbing 3-5 steps with a railing? : Total 6 Click Score: 11    End of Session Equipment Utilized During Treatment: Gait belt Activity Tolerance: Patient tolerated treatment well Patient left: in bed;with call bell/phone within reach;with bed alarm set;with family/visitor present;with SCD's reapplied Nurse Communication: Mobility status;Weight bearing status PT Visit Diagnosis: Other abnormalities of gait and mobility (R26.89);Muscle weakness (generalized) (M62.81)  Time: 1674-2552 PT Time Calculation (min) (ACUTE ONLY): 49 min   Charges:   PT Evaluation $PT Eval Moderate Complexity: 1 Mod PT Treatments $Therapeutic Exercise: 8-22 mins $Therapeutic Activity: 8-22 mins        D. Royetta Asal PT, DPT 05/03/20, 4:43 PM

## 2020-05-03 NOTE — Op Note (Signed)
05/03/2020  10:55 AM  PATIENT:  Claire Hansen  60 y.o. 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, initial encounter T84.031A   PROCEDURE:  Procedure(s): LEFT FEMORAL COMPONENT REVISION (Left) APPLICATION OF CELL SAVER (N/A)  SURGEON: Laurene Footman, MD  ASSISTANTS: None  ANESTHESIA:   spinal  EBL:  Total I/O In: 160 [Blood:160] Out: 400 [Blood:400]  BLOOD ADMINISTERED:150 CC CELLSAVER  DRAINS: Incisional wound VAC   LOCAL MEDICATIONS USED:  MARCAINE    and OTHER Exparel  SPECIMEN:  No Specimen  DISPOSITION OF SPECIMEN:  N/A  COUNTS:  YES  TOURNIQUET:  * No tourniquets in log *  IMPLANTS: Medacta SMS 5 stem standard, 52 mm Mpact DM liner with ceramic S 28 mm head  DICTATION: .Dragon Dictation patient was brought to the operating room and after adequate spinal anesthesia was obtained the patient was placed on the operative table with the Medacta attachment.  The left foot was placed to the Medacta attachment and hip prepped and draped in the usual sterile fashion.  After preop x-ray taken and appropriate patient identification and timeout procedure was completed the prior bikini incision was opened and the interval between the ATFL and the quads was developed with extensive scar tissue present.  When the anterior capsule was exposed T capsulotomy was performed with suture placed to retract the lateral flap and deep retractor placed.  The proximal stem and femoral head component were exposed and with traction the head was separated from the stem and the leg lowered and external rotation.  The stem was potted so approximately was loose but distally fixed so with thin flexible osteotomes and K wire to drill around this as well as a curette clear laterally the soft tissue attachments proximally were released as well as  distally.  It took a great deal of effort to get the stem out but when he came out the radiographically the proximal femur appeared intact and sequential broaching was then carried out to a 5 SMS.  The prior bipolar components were removed from the cup and trials placed with intraoperative x-ray taken.  After this was completed and determination of final components the hip was irrigated with pulsatile lavage 3 L and final components inserted and the hip reduced.  Throughout the case Exparel with Marcaine was infiltrated in the periarticular tissues to aid in postop analgesia.  Additionally Cell Saver was used during the procedure.  With final x-ray taken fibrillar was placed around the medial neck to help with bleeding postop and the wound was closed with a running heavy Quill for the deep fascia 3 OV lock subcutaneously and skin staples followed by Praveena.  PLAN OF CARE: Admit to inpatient   PATIENT DISPOSITION:  PACU - hemodynamically stable.

## 2020-05-03 NOTE — Plan of Care (Signed)

## 2020-05-03 NOTE — Anesthesia Procedure Notes (Addendum)
Spinal  Patient location during procedure: OR Start time: 05/03/2020 7:52 AM End time: 05/03/2020 7:53 AM Reason for block: surgical anesthesia Staffing Performed: anesthesiologist  Anesthesiologist: [name], MD Preanesthetic Checklist Completed: patient identified, IV checked, site marked, risks and benefits discussed, surgical consent, monitors and equipment checked, pre-op evaluation and timeout performed Spinal Block Patient position: sitting Prep: ChloraPrep Patient monitoring: heart rate, continuous pulse ox, blood pressure and cardiac monitor Approach: midline Location: L3-4 Injection technique: single-shot Needle Needle type: Whitacre and Introducer  Needle gauge: 24 G Needle length: 9 cm Assessment Sensory level: T10 Events: CSF return Additional Notes Negative paresthesia. Negative blood return. Positive free-flowing CSF. Expiration date of kit checked and confirmed. Patient tolerated procedure well, without complications.

## 2020-05-03 NOTE — Progress Notes (Signed)
Initial Nutrition Assessment  DOCUMENTATION CODES:   Obesity unspecified  INTERVENTION:   -MVI with minerals daily -Glucerna Shake po BID, each supplement provides 220 kcal and 10 grams of protein -Ensure Max po daily, each supplement provides 150 kcal and 30 grams of protein.   NUTRITION DIAGNOSIS:   Increased nutrient needs related to post-op healing as evidenced by estimated needs.  GOAL:   Patient will meet greater than or equal to 90% of their needs  MONITOR:   PO intake,Supplement acceptance,Labs,Weight trends,Skin,I & O's  REASON FOR ASSESSMENT:   Malnutrition Screening Tool    ASSESSMENT:   Claire Hansen is a 60 y.o. female here today for history and physical for left total hip revision of femoral component by Dr. Hessie Knows on 05/03/2020. Left total hip arthroplasty was performed in 2019. Patient complains of pain in her groin and lateral thigh since December that has been increasing. Pain is worse with weightbearing activity. X-rays of the left hip from December show signs of loosening of the femoral component. Patient's pain has been moderate to severe, 10 out of 10.  Pt admitted with loose femoral component s/p total lt hip arthroplasty.   4/8- s/p Procedure(s): LEFT FEMORAL COMPONENT REVISION (Left) APPLICATION OF CELL SAVER (N/A)  Reviewed I/O's: +660 ml x 24 hours  UOP: 200 ml x 24 hours  Spoke with pt and husband at bedside. She repots a general decline in health over the past 5-6 months secondary to multiple health issues (fibroids and hip fracture). Per pt, due to pain and multiple medication side effects, her intake has significantly decreased. Observed meal tray- pt consumed about 75% of lunch meal.   Pt reports progressive wt loss over the past 6 months. She shares her UBW is around 310#. Reviewed wt hx; pt has experienced a 2 % wt loss over the past month, which is not significant for time frame.   Discussed importance of good meal and supplement  intake to promote healing. Pt amenable to oral nutrition supplements.   Medications reviewed and include colace and 0.9% sodium chloride infusion @ 50 ml/hr.   Lab Results  Component Value Date   HGBA1C 9.3 (H) 03/22/2020   PTA DM medications are 500 mg metformin BID and 7 mg semaglutide daily.   Labs reviewed: CBGS: 154-164 (inpatient orders for glycemic control are 0-15 units insulin aspart TID with meals and 500 mg metformin BID).   NUTRITION - FOCUSED PHYSICAL EXAM:  Flowsheet Row Most Recent Value  Orbital Region No depletion  Upper Arm Region No depletion  Thoracic and Lumbar Region No depletion  Buccal Region No depletion  Temple Region No depletion  Clavicle Bone Region No depletion  Clavicle and Acromion Bone Region No depletion  Scapular Bone Region No depletion  Dorsal Hand No depletion  Patellar Region No depletion  Anterior Thigh Region No depletion  Posterior Calf Region No depletion  Edema (RD Assessment) Mild  Hair Reviewed  Eyes Reviewed  Mouth Reviewed  Skin Reviewed  Nails Reviewed       Diet Order:   Diet Order            Diet Carb Modified Fluid consistency: Thin; Room service appropriate? Yes  Diet effective now                 EDUCATION NEEDS:   Education needs have been addressed  Skin:  Skin Assessment: Skin Integrity Issues: Skin Integrity Issues:: Wound VAC Wound Vac: lt hip  Last BM:  05/02/20  Height:   Ht Readings from Last 1 Encounters:  05/03/20 5\' 5"  (1.651 m)    Weight:   Wt Readings from Last 1 Encounters:  05/03/20 108.9 kg    Ideal Body Weight:  56.8 kg  BMI:  Body mass index is 39.94 kg/m.  Estimated Nutritional Needs:   Kcal:  1800-2000  Protein:  115-130 grams  Fluid:  > 1.8 L    Loistine Chance, RD, LDN, [place] Registered Dietitian II Certified Diabetes Care and Education Specialist Please refer to [facility] for RD and/or RD on-call/weekend/after hours pager

## 2020-05-03 NOTE — Anesthesia Preprocedure Evaluation (Signed)
Anesthesia Evaluation  Patient identified by MRN, date of birth, ID band Patient awake    Reviewed: Allergy & Precautions, NPO status , Patient's Chart, lab work & pertinent test results  History of Anesthesia Complications Negative for: history of anesthetic complications  Airway Mallampati: II  TM Distance: >3 FB Neck ROM: Full    Dental  (+) Dental Advidsory Given, Teeth Intact   Pulmonary neg shortness of breath, asthma , neg sleep apnea, neg recent URI,    breath sounds clear to auscultation- rhonchi (-) wheezing      Cardiovascular hypertension, Pt. on medications (-) angina(-) CAD, (-) Past MI, (-) Cardiac Stents and (-) CABG (-) dysrhythmias (-) Valvular Problems/Murmurs Rhythm:Regular Rate:Normal - Systolic murmurs and - Diastolic murmurs    Neuro/Psych neg Seizures Anxiety negative neurological ROS     GI/Hepatic negative GI ROS, Neg liver ROS,   Endo/Other  diabetes, Oral Hypoglycemic Agents  Renal/GU negative Renal ROS     Musculoskeletal  (+) Arthritis ,   Abdominal (+) + obese,   Peds  Hematology negative hematology ROS (+)   Anesthesia Other Findings Past Medical History: No date: Allergy No date: Anxiety No date: Asthma     Comment:  pt denies having asthma No date: Chronic osteoarthritis     Comment:  knees No date: Deviated septum No date: Diabetes mellitus without complication (HCC) No date: Hyperlipidemia No date: Hypertension No date: Irregular menstrual cycle No date: Lymphadenopathy No date: Obesity No date: Peri-menopause 2015: Pneumonia   Reproductive/Obstetrics                             Anesthesia Physical  Anesthesia Plan  ASA: II  Anesthesia Plan: Spinal   Post-op Pain Management:    Induction: Intravenous  PONV Risk Score and Plan: 2 and TIVA and Propofol infusion  Airway Management Planned: Simple Face Mask  Additional Equipment:    Intra-op Plan:   Post-operative Plan: Extubation in OR  Informed Consent: I have reviewed the patients History and Physical, chart, labs and discussed the procedure including the risks, benefits and alternatives for the proposed anesthesia with the patient or authorized representative who has indicated his/her understanding and acceptance.     Dental advisory given  Plan Discussed with: CRNA and Anesthesiologist  Anesthesia Plan Comments:         Anesthesia Quick Evaluation

## 2020-05-03 NOTE — Transfer of Care (Signed)
Immediate Anesthesia Transfer of Care Note  Patient: Claire Hansen  Procedure(s) Performed: LEFT FEMORAL COMPONENT REVISION (Left Hip) APPLICATION OF CELL SAVER (N/A )  Patient Location: PACU  Anesthesia Type:Spinal  Level of Consciousness: awake, alert  and oriented  Airway & Oxygen Therapy: Patient Spontanous Breathing  Post-op Assessment: Report given to RN  Post vital signs: Reviewed and stable  Last Vitals:  Vitals Value Taken Time  BP 143/79 05/03/20 1059  Temp 36.5 C 05/03/20 1059  Pulse 76 05/03/20 1059  Resp 19 05/03/20 1059  SpO2 94 % 05/03/20 1059  Vitals shown include unvalidated device data.  Last Pain:  Vitals:   05/03/20 0619  TempSrc: Temporal  PainSc: 6          Complications: No complications documented.

## 2020-05-03 NOTE — Progress Notes (Signed)
Pt admitted to room 159 from PACU. Pt oriented to room and unit equipment. All questions and concerns addressed at this time.     05/03/20 1226  Vitals  Temp (!) 97.4 F (36.3 C)  BP (!) 154/91  MAP (mmHg) 107  BP Location Left Arm  BP Method Automatic  Patient Position (if appropriate) Lying  Pulse Rate 74  Resp 16  MEWS COLOR  MEWS Score Color Green  Oxygen Therapy  SpO2 96 %  O2 Device Room Air

## 2020-05-03 NOTE — H&P (Signed)
Chief Complaint  Patient presents with  . Pre-op Exam  femoral component of prosthetic left hip scheduled 05/03/20 with Dr. Rudene Christians    History of the Present Illness: Claire Hansen is a 60 y.o. female here today for history and physical for left total hip revision of femoral component by Dr. Hessie Knows on 05/03/2020. Left total hip arthroplasty was performed in 2019. Patient complains of pain in her groin and lateral thigh since December that has been increasing. Pain is worse with weightbearing activity. X-rays of the left hip from December show signs of loosening of the femoral component. Patient's pain has been moderate to severe, 10 out of 10.   I have reviewed past medical, surgical, social and family history, and allergies as documented in the EMR.  Past Medical History: Past Medical History:  Diagnosis Date  . Allergic state  . Arthritis  . Diabetes mellitus type 2, uncomplicated (CMS-HCC)  . Diabetes mellitus with renal manifestation (CMS-HCC) 11/06/2010  . Hyperlipidemia 08/13/2014  . Hypertension   Past Surgical History: Past Surgical History:  Procedure Laterality Date  . CESAREAN SECTION  . CHOLECYSTECTOMY  . COLONOSCOPY  . JOINT REPLACEMENT  . TONSILLECTOMY  . Total hip arthroplasty anterior approach Right 11/05/2016  Dr.Kameryn Davern  . Total hip arthroplasty anterior approach Left 07/01/2017  Dr.Kanyah Matsushima  . TUBAL LIGATION   Past Family History: Family History  Problem Relation Age of Onset  . No Known Problems Mother   Medications: Current Outpatient Medications Ordered in Epic  Medication Sig Dispense Refill  . acetaminophen (TYLENOL) 500 MG tablet Take 500 mg by mouth every 8 (eight) hours  . aspirin 81 MG EC tablet Take 81 mg by mouth once daily.  . clobetasoL (CORMAX) 0.05 % external solution Apply 1-2 drops to affected areas scalp 1-2 times daily until rash improved. Avoid face, groin, underarms.  . ergocalciferol, vitamin D2, 1,250 mcg (50,000 unit) capsule Take  50,000 Units by mouth once a week  . gabapentin (NEURONTIN) 600 MG tablet Take 0.5 tablets by mouth 3 (three) times daily  . hydrALAZINE (APRESOLINE) 10 MG tablet Take 1 tablet (10 mg total) by mouth 3 (three) times daily 270 tablet 1  . HYDROcodone-acetaminophen (NORCO) 5-325 mg tablet Take 1 tablet by mouth every 8 (eight) hours as needed for up to 30 doses 30 tablet 0  . loratadine (CLARITIN) 10 mg capsule Take 10 mg by mouth once daily  . meloxicam (MOBIC) 15 MG tablet Take 1 tablet by mouth once daily  . metFORMIN (GLUCOPHAGE-XR) 500 MG XR tablet metFORMIN HCl ER 500 MG Oral Tablet Extended Release 24 Hour QTY: 0 tablet Days: 0 Refills: 0 Written: 05/26/19 Patient Instructions: once daily  . metoprolol succinate (TOPROL-XL) 25 MG XL tablet Take 2 tablets (50 mg total) by mouth once daily 180 tablet 1  . montelukast (SINGULAIR) 10 mg tablet Take 1 tablet (10 mg total) by mouth once daily 90 tablet 3  . olmesartan-amLODIPine-hydrochlorothiazide (TRIBENZOR) 40-10-25 mg tablet Take 1 tablet by mouth once daily 30 tablet 0  . ONETOUCH VERIO TEST STRIPS test strip  . oxyCODONE (ROXICODONE) 5 MG immediate release tablet Take 1 tablet (5 mg total) by mouth every 4 (four) hours as needed for Pain 30 tablet 0  . rosuvastatin (CRESTOR) 10 MG tablet Take 10 mg by mouth every evening  . RYBELSUS 7 mg Tab TAKE ONE TABLET BY MOUTH EVERY MORNING ON AN EMPTY STOMACH WITH 4OZ OF WATER. WAIT AT LEAST 30 MINUTES BEFORE EATING DRINKING OR OTHER  MEDS  . spironolactone (ALDACTONE) 25 MG tablet Take 1 tablet (25 mg total) by mouth once daily 30 tablet 0  . tiZANidine (ZANAFLEX) 4 MG tablet Take 1/2 to 1 tablet at night before bed. May increase to 1 tablet every 8 hours for pain if needed and tolerated. 90 tablet 1  . traMADoL (ULTRAM) 50 mg tablet Take 1 tablet (50 mg total) by mouth every 8 (eight) hours as needed for Pain 20 tablet 0   No current Epic-ordered facility-administered medications on file.    Allergies: Allergies  Allergen Reactions  . Shellfish Containing Products Hives and Shortness Of Breath    Body mass index is 41.1 kg/m.  Review of Systems: A comprehensive 14 point ROS was performed, reviewed, and the pertinent orthopaedic findings are documented in the HPI.  Vitals:  04/24/20 1401  BP: 118/80    General Physical Examination:   General:  Well developed, well nourished, no apparent distress, normal affect, presents in a wheelchair.  HEENT: Head normocephalic, atraumatic, PERRL.   Abdomen: Soft, non tender, non distended, Bowel sounds present.  Heart: Examination of the heart reveals regular, rate, and rhythm. There is no murmur noted on ascultation. There is a normal apical pulse.  Lungs: Lungs are clear to auscultation. There is no wheeze, rhonchi, or crackles. There is normal expansion of bilateral chest walls.   Musculoskeletal Examination:  Patient has fairly good hip range of motion with internal X rotation with moderate discomfort in the groin and proximal lateral thigh. Normal sensation throughout the left lower extremity. She is noted to have a foot drop on the left lower extremity. She is able to actively extend the knee.  Radiographs:  AP pelvis and lateral x-rays of the left hip were reviewed by me today from 01/10/2020. These show prior total hip arthroplasty with radiolucent line laterally and anteriorly on the lateral view, with possible potting.   X-ray Impression  loosening of left femoral stem.   Assessment: ICD-10-CM  1. 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 60 year old female with left total hip arthroplasty with history exam and x-ray findings indicating loose femoral component. Pain is severe and debilitating. Risks, benefits, complications of a left total hip revision of the femoral component have been discussed with the patient. Patient has agreed and  consented procedure with Dr. Hessie Knows on 05/03/2020  Electronically signed by Feliberto Gottron, PA at 04/24/2020 2:29 PM EDT   Reviewed  H+P. No changes noted.

## 2020-05-04 LAB — GLUCOSE, CAPILLARY
Glucose-Capillary: 213 mg/dL — ABNORMAL HIGH (ref 70–99)
Glucose-Capillary: 233 mg/dL — ABNORMAL HIGH (ref 70–99)
Glucose-Capillary: 208 mg/dL — ABNORMAL HIGH (ref 70–99)
Glucose-Capillary: 209 mg/dL — ABNORMAL HIGH (ref 70–99)

## 2020-05-04 LAB — BASIC METABOLIC PANEL
Anion gap: 9 (ref 5–15)
BUN: 24 mg/dL — ABNORMAL HIGH (ref 6–20)
CO2: 25 mmol/L (ref 22–32)
Calcium: 9.1 mg/dL (ref 8.9–10.3)
Chloride: 101 mmol/L (ref 98–111)
Creatinine, Ser: 1.34 mg/dL — ABNORMAL HIGH (ref 0.44–1.00)
GFR, Estimated: 46 mL/min — ABNORMAL LOW (ref >60)
Glucose, Bld: 235 mg/dL — ABNORMAL HIGH (ref 70–99)
Potassium: 4 mmol/L (ref 3.5–5.1)
Sodium: 135 mmol/L (ref 135–145)

## 2020-05-04 LAB — CBC
HCT: 29.1 % — ABNORMAL LOW (ref 36.0–46.0)
Hemoglobin: 10.2 g/dL — ABNORMAL LOW (ref 12.0–15.0)
MCH: 29.4 pg (ref 26.0–34.0)
MCHC: 35.1 g/dL (ref 30.0–36.0)
MCV: 83.9 fL (ref 80.0–100.0)
Platelets: 289 10*3/uL (ref 150–400)
RBC: 3.47 MIL/uL — ABNORMAL LOW (ref 3.87–5.11)
RDW: 13.6 % (ref 11.5–15.5)
WBC: 9.8 10*3/uL (ref 4.0–10.5)
nRBC: 0 % (ref 0.0–0.2)

## 2020-05-04 MED ORDER — FE FUMARATE-B12-VIT C-FA-IFC PO CAPS
1.0000 | ORAL_CAPSULE | Freq: Two times a day (BID) | ORAL | Status: DC
  Administered 2020-05-04 – 2020-05-07 (×7): 1 via ORAL
  Filled 2020-05-04 (×10): qty 1

## 2020-05-04 NOTE — Progress Notes (Signed)
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    PT Comments    Pt was pleasant and motivated to participate during the session.  Pt required less physical assistance with transfers and ambulation and with cuing was able to maintain LLE WB status throughout the session.  Pt had difficulty advancing her LLE during gait training and was limited by both LLE weakness and foot drop.  Pt may benefit from an AFO to assist with advancing her LLE if DF does not improve s/p peroneal nerve decompression surgery 04/29/20.  Pt will benefit from PT services in a SNF setting upon discharge to safely address deficits listed in patient problem list for decreased caregiver assistance and eventual return to PLOF.     Follow Up Recommendations  SNF;Supervision/Assistance - 24 hour     Equipment Recommendations  None recommended by PT    Recommendations for Other Services       Precautions / Restrictions Precautions Precautions: Fall;Anterior Hip Precaution Booklet Issued: Yes (comment) Restrictions Weight Bearing Restrictions: Yes LLE Weight Bearing: Touchdown weight bearing Other Position/Activity Restrictions: Recent peroneal nerve decompression to the L lateral lower leg, watch incision    Mobility  Bed Mobility               General bed mobility comments: NT, pt in recliner    Transfers Overall transfer level: Needs assistance Equipment used: Rolling walker (2 wheeled) Transfers: Sit to/from Stand Sit to Stand: Min guard         General transfer comment: Mod verbal cues for  sequencing  Ambulation/Gait Ambulation/Gait assistance: Min guard Gait Distance (Feet): 3 Feet x 3 Assistive device: Rolling walker (2 wheeled) Gait Pattern/deviations: Step-to pattern Gait velocity: decreased   General Gait Details: Mod multi-modal cues for sequencing to ensure LLE WB compliance; verbal and visual demonstration on proper sequencing provided prior to practice    Stairs             Wheelchair Mobility    Modified Rankin (Stroke Patients Only)       Balance Overall balance assessment: Needs assistance   Sitting balance-Leahy Scale: Normal     Standing balance support: Bilateral upper extremity supported;During functional activity Standing balance-Leahy Scale: Good Standing balance comment: Heavy lean on the RW for support for LLE WB compliance but no LOB during functional tasks                            Cognition Arousal/Alertness: Awake/alert Behavior During Therapy: WFL for tasks assessed/performed Overall Cognitive Status: Within Functional Limits for tasks assessed                                        Exercises Total Joint Exercises Ankle Circles/Pumps: AROM;Strengthening;Both;10 reps (with manual resistance) Quad Sets: Strengthening;Both;10 reps Gluteal Sets: Strengthening;Both;10 reps Hip ABduction/ADduction: AAROM;Left;10 reps;Strengthening;Right (manual resistance on the RLE) Straight Leg Raises: AAROM;Left;10 reps Long Arc Quad: Strengthening;Both;10 reps (with manual resistance) Knee Flexion: Strengthening;Both;10 reps (with manual resistance) Other Exercises Other Exercises: HEP  education and review per handout Other Exercises: Anterior hip precaution education Other Exercises: Education/discussion with pt and spouse regarding L foot drop, prognosis, and AFO to facilitate safety with amb if needed    General Comments        Pertinent Vitals/Pain Pain Assessment: 0-10 Pain Score: 5  Pain Location:  L hip Pain Descriptors / Indicators: Sore Pain Intervention(s): Premedicated before session;Monitored during session    Home Living                      Prior Function            PT Goals (current goals can now be found in the care plan section) Progress towards PT goals: Progressing toward goals    Frequency    BID      PT Plan Current plan remains appropriate    Co-evaluation              AM-PAC PT "6 Clicks" Mobility   Outcome Measure  Help needed turning from your back to your side while in a flat bed without using bedrails?: A Lot Help needed moving from lying on your back to sitting on the side of a flat bed without using bedrails?: A Lot Help needed moving to and from a bed to a chair (including a wheelchair)?: A Lot Help needed standing up from a chair using your arms (e.g., wheelchair or bedside chair)?: A Little Help needed to walk in hospital room?: Total Help needed climbing 3-5 steps with a railing? : Total 6 Click Score: 11    End of Session Equipment Utilized During Treatment: Gait belt Activity Tolerance: Patient tolerated treatment well Patient left: with call bell/phone within reach;with family/visitor present;with SCD's reapplied;in chair;Other (comment) (SCD to RLE only secondary to L lower leg incision) Nurse Communication: Mobility status;Weight bearing status;Other (comment) (SCD to RLE only, pt found in chair but no chair alarm present) PT Visit Diagnosis: Other abnormalities of gait and mobility (R26.89);Muscle weakness (generalized) (M62.81)     Time: 1950-9326 PT Time Calculation (min) (ACUTE ONLY): 41 min  Charges:  $Gait Training: 8-22 mins $Therapeutic Exercise: 8-22 mins $Therapeutic Activity: 8-22 mins                     D. Scott Mariaclara Spear PT, DPT 05/04/20, 1:14 PM

## 2020-05-04 NOTE — Progress Notes (Addendum)
   Subjective: 1 Day Post-Op Procedure(s) (LRB): LEFT FEMORAL COMPONENT REVISION (Left) APPLICATION OF CELL SAVER (N/A) Patient reports pain as 7 on 0-10 scale.   Patient is well, and has had no acute complaints or problems Denies any CP, SOB, ABD pain. We will continue therapy today.   Objective: Vital signs in last 24 hours: Temp:  [97.4 F (36.3 C)-98.7 F (37.1 C)] 98.7 F (37.1 C) (04/09 0418) Pulse Rate:  [66-105] 105 (04/09 0418) Resp:  [15-22] 16 (04/09 0418) BP: (114-154)/(72-98) 128/79 (04/09 0418) SpO2:  [94 %-100 %] 97 % (04/09 0418)  Intake/Output from previous day: 04/08 0701 - 04/09 0700 In: 2456 [P.O.:240; I.V.:1748.6; Blood:160; IV Piggyback:307.3] Out: 1150 [Urine:750; Blood:400] Intake/Output this shift: Total I/O In: 956 [I.V.:648.6; IV Piggyback:307.3] Out: 550 [Urine:550]  Recent Labs    05/03/20 1252 05/04/20 0517  HGB 12.0 10.2*   Recent Labs    05/03/20 1252 05/04/20 0517  WBC 20.9* 9.8  RBC 4.19 3.47*  HCT 35.5* 29.1*  PLT 350 289   Recent Labs    05/03/20 1252 05/04/20 0517  NA  --  135  K  --  4.0  CL  --  101  CO2  --  25  BUN  --  24*  CREATININE 1.00 1.34*  GLUCOSE  --  235*  CALCIUM  --  9.1   No results for input(s): LABPT, INR in the last 72 hours.  EXAM General - Patient is Alert, Appropriate and Oriented Extremity - Neurovascular intact Sensation intact distally Intact pulses distally No cellulitis present Compartment soft  Lacks dorsiflexion left LE. preexisting  Dressing - dressing C/D/I and no drainage, prevena intact with 50cc drainage Motor Function - intact, toes well on exam.   Past Medical History:  Diagnosis Date  . Allergy   . Anxiety   . Asthma    pt denies having asthma  . Chronic osteoarthritis    knees  . Deviated septum   . Diabetes mellitus without complication ([place])   . Hyperlipidemia   . Hypertension   . Irregular menstrual cycle   . Lymphadenopathy   . Obesity   .  Peri-menopause   . Pneumonia 2015    Assessment/Plan:   1 Day Post-Op Procedure(s) (LRB): LEFT FEMORAL COMPONENT REVISION (Left) APPLICATION OF CELL SAVER (N/A) Active Problems:   S/P revision of total hip  Estimated body mass index is 39.94 kg/m as calculated from the following:   Height as of this encounter: 5\' 5"  (1.651 m).   Weight as of this encounter: 108.9 kg. Advance diet Up with therapy, toe-touch weightbearing left lower extremity x4 weeks Pain well controlled Vital signs are stable Acute post op blood loss anemia  - Hgb 10.2, start iron supplement.  Recheck labs in the morning Work on bowel movement Care management to assist with discharge  DVT Prophylaxis - Lovenox, TED hose and SCDs   T. Rachelle Hora, PA-C [place] 05/04/2020, 6:26 AM

## 2020-05-04 NOTE — Evaluation (Signed)
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   Clinical Impression   Pt was seen for OT evaluation this date. Prior to hospital admission, pt was ambulating with difficulty 2/2 LLE pain but able to manage 20+20 steps into her 3rd floor apartment with her son and spouse on either side of her and taking her time. Pt received seated on BSC after toilet, ready to get up with OT. MIN A for STS transfer from [facility] with cues for RW and increased effort. Spouse providing SBA. Pt required sequencing cues for RW to maintain TWB for LLE. Currently pt demonstrates impairments as described below (See OT problem list) which functionally limit her ability to perform ADL/self-care tasks and ADL mobility at baseline level of independence. Pt currently requires MOD A for LB ADL, MIN A for ADL transfers, and CGA-MIN A for ADL mobility with RW ~5'. Pt endorses significant L hip pain. Pt/spouse instructed in role of OT, follow up therapy options, precautions, falls prevention, AE/DME for ADL tasks including tub transfer bench. Pt would benefit from skilled OT services to address noted impairments and functional limitations (see below for any additional details) in order to maximize safety and independence while minimizing falls risk and caregiver burden. Upon hospital discharge, recommend STR to maximize pt safety and return to PLOF.    Follow Up Recommendations  SNF    Equipment Recommendations  Tub/shower bench    Recommendations for Other Services       Precautions / Restrictions Precautions Precautions: Fall;Anterior Hip Precaution Booklet  Issued: Yes (comment) Restrictions Weight Bearing Restrictions: Yes LLE Weight Bearing: Touchdown weight bearing Other Position/Activity Restrictions: Recent peroneal nerve decompression to the L lateral lower leg, watch incision      Mobility Bed Mobility               General bed mobility comments: NT, pt in recliner    Transfers Overall transfer level: Needs assistance Equipment used: Rolling walker (2 wheeled) Transfers: Sit to/from Stand Sit to Stand: Min assist         General transfer comment: Min A from [facility] with cues for RW and TWB    Balance Overall balance assessment: Needs assistance Sitting-balance support: No upper extremity supported Sitting balance-Leahy Scale: Normal     Standing balance support: Bilateral upper extremity supported;During functional activity Standing balance-Leahy Scale: Fair Standing balance comment: heavy BUE reliance on RW                           ADL either performed or assessed with clinical judgement   ADL Overall ADL's : Needs assistance/impaired                                       General ADL Comments: Pt requires MOD A for LB ADL tasks, Max A for pericare after toileting (spouse provided during session), MIN A for ADL transfers, cues for RW mgt     Vision Baseline Vision/History: Wears glasses Wears Glasses: Reading only Patient Visual Report: No change  from baseline       Perception     Praxis      Pertinent Vitals/Pain Pain Assessment: 0-10 Pain Score: 9  Pain Location: L hip with mobility, down to 4-5/10 with rest Pain Descriptors / Indicators: Sore Pain Intervention(s): Limited activity within patient's tolerance;Monitored during session;Premedicated before session;Repositioned     Hand Dominance Right   Extremity/Trunk Assessment Upper Extremity Assessment Upper Extremity Assessment: Overall WFL for tasks assessed   Lower Extremity Assessment Lower Extremity  Assessment: LLE deficits/detail LLE Deficits / Details: BLE sensation to light touch intact with bilateral ankle AROM and strength at baseline (limited on the L secondary to peroneal nerve neuropathy s/p recent decompression); LLE hip flex <3/5, knee flex/ext >/= 3/5, impaired DF LLE Sensation: WNL       Communication Communication Communication: No difficulties   Cognition Arousal/Alertness: Awake/alert Behavior During Therapy: WFL for tasks assessed/performed Overall Cognitive Status: Within Functional Limits for tasks assessed                                     General Comments       Exercises  Other Exercises: Pt/spouse instructed in role of OT, follow up therapy options, precautions, falls prevention, AE/DME for ADL tasks including tub transfer bench   Shoulder Instructions      Home Living Family/patient expects to be discharged to:: Private residence Living Arrangements: Spouse/significant other;Children Available Help at Discharge: Family;Available 24 hours/day Type of Home: Apartment Home Access: Stairs to enter [employer] of Steps: 40 (3rd floor with 2 x 20 steps to enter apt) Entrance Stairs-Rails: Right;Left;Can reach both Home Layout: One level     Bathroom Shower/Tub: Teacher, early years/pre: Standard     Home Equipment: Environmental consultant - 2 wheels;Bedside commode;Wheelchair - manual          Prior Functioning/Environment Level of Independence: Independent        Comments: Ind amb community distances without an AD, no fall history, Ind with bathing/dressing, assist with heavy household chores        OT Problem List: Decreased strength;Pain;Decreased range of motion;Impaired balance (sitting and/or standing);Decreased knowledge of use of DME or AE;Obesity;Decreased knowledge of precautions      OT Treatment/Interventions: Self-care/ADL training;Therapeutic exercise;Therapeutic activities;DME and/or AE  instruction;Patient/family education;Balance training    OT Goals(Current goals can be found in the care plan section) Acute Rehab OT Goals Patient Stated Goal: go home OT Goal Formulation: With patient/family Time For Goal Achievement: 05/18/20 Potential to Achieve Goals: Good ADL Goals Pt Will Perform Lower Body Dressing: sit to/from stand;with caregiver independent in assisting Pt Will Transfer to Toilet: with supervision;ambulating;bedside commode (BSC over toilet, LRAD For amb) Additional ADL Goal #1: Pt will perform morning ADL tasks with PRN MIN A from caregiver and AE as needed. Additional ADL Goal #2: Pt will verbalize plan to implement at least 1 learned falls prevention strategy in order to maximize safety/independence.  OT Frequency: Min 2X/week   Barriers to D/C: Inaccessible home environment          Co-evaluation              AM-PAC OT "6 Clicks" Daily Activity     Outcome Measure Help from another person eating meals?: None Help from another person taking care of personal grooming?: None Help from another person toileting, which includes using toliet, bedpan, or urinal?: A Little Help from another person  bathing (including washing, rinsing, drying)?: A Lot Help from another person to put on and taking off regular upper body clothing?: None Help from another person to put on and taking off regular lower body clothing?: A Lot 6 Click Score: 19   End of Session Equipment Utilized During Treatment: Gait belt;Rolling walker  Activity Tolerance: Patient tolerated treatment well Patient left: in chair;with call bell/phone within reach;with family/visitor present  OT Visit Diagnosis: Other abnormalities of gait and mobility (R26.89);Muscle weakness (generalized) (M62.81);Pain Pain - Right/Left: Left Pain - part of body: Hip                Time: 0931-1216 OT Time Calculation (min): 31 min Charges:  OT General Charges $OT Visit: 1 Visit OT Evaluation $OT Eval  Moderate Complexity: 1 Mod OT Treatments $Self Care/Home Management : 23-37 mins  Hanley Hays, MPH, MS, OTR/L ascom [phone] 05/04/20, 3:28 PM

## 2020-05-04 NOTE — Progress Notes (Signed)
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    PT Comments    Pt was sitting in recliner upon arriving. She agrees to PT session and is extremely motivated. She was able to stand to bariatric RW with min assist however struggles with maintaining proper wt bearing. She ambulated ~ 5 ft without LOB but poor safety and poor ability to advance LLE. MD/PA secure chat for order for AFO/PRAFO to treat LLE foot drop. No AROM L ankle dorsiflexion noted. Lengthy discussion with pt/pt's spouse about DC disposition and safety concerns with DC to home form acute hospital. Pt lives on third story apartment. She continues to want to DC to home but does state by end of session that she will continue to consider rehab as option. PT will continue efforts to progress as able per POC.    Follow Up Recommendations  SNF;Supervision/Assistance - 24 hour     Equipment Recommendations  None recommended by PT       Precautions / Restrictions Precautions Precautions: Fall;Anterior Hip Precaution Booklet Issued: Yes (comment) Restrictions Weight Bearing Restrictions: Yes LLE Weight Bearing: Touchdown weight bearing Other Position/Activity Restrictions: Recent peroneal nerve decompression to the L lateral lower leg, watch incision    Mobility  Bed Mobility    General bed mobility comments: in recliner pre/post session    Transfers Overall transfer level: Needs assistance Equipment used: Rolling walker (2 wheeled) Transfers: Sit to/from Stand Sit to Stand: Min assist         General transfer comment: Pt requires min assist however is  unable to properly adhere to TDWB  Ambulation/Gait Ambulation/Gait assistance: Min guard;Min assist Gait Distance (Feet): 5 Feet Assistive device: Rolling walker (2 wheeled) Gait Pattern/deviations: Step-to pattern Gait velocity: decreased   General Gait Details: Poor ability to maintaoin proper wt bearing. Distance limited by wt bearing and pain. flex posture. Pt lives on 3rd story apartment       Balance Overall balance assessment: Needs assistance Sitting-balance support: No upper extremity supported Sitting balance-Leahy Scale: Normal Sitting balance - Comments: no balance deficits in sitting   Standing balance support: Bilateral upper extremity supported;During functional activity Standing balance-Leahy Scale: Fair Standing balance comment: no balance deficits however unable to adhere to proper wt bearing restrictions      Cognition Arousal/Alertness: Awake/alert Behavior During Therapy: WFL for tasks assessed/performed Overall Cognitive Status: Within Functional Limits for tasks assessed      General Comments: Pt is A and O x 4 and motivated. Limited by wt bearing and inability to properly wt bear      Exercises Total Joint Exercises Ankle Circles/Pumps: AROM;Strengthening;Both;10 reps (with manual resistance) Quad Sets: Strengthening;Both;10 reps Gluteal Sets: Strengthening;Both;10 reps Hip ABduction/ADduction: AAROM;Left;10 reps;Strengthening;Right (manual resistance on the RLE) Straight Leg Raises: AAROM;Left;10 reps Long Arc Quad: Strengthening;Both;10 reps (with manual resistance) Knee Flexion: Strengthening;Both;10 reps (with manual resistance) Other Exercises Other Exercises: HEP education and review per handout Other Exercises: Anterior hip precaution education Other Exercises: Education/discussion with pt and spouse regarding L foot drop, prognosis, and AFO to facilitate safety with amb if needed Other Exercises: Pt/spouse instructed in role of OT, follow  up therapy  options, precautions, falls prevention, AE/DME for ADL tasks including tub transfer bench        Pertinent Vitals/Pain Pain Assessment:  (0 at rest/ 7/10 in standing/movements) Pain Score: 7  Pain Location: L hip with mobility Pain Descriptors / Indicators: Sore Pain Intervention(s): Limited activity within patient's tolerance;Monitored during session;Repositioned;Premedicated before session;Ice applied    Home Living Family/patient expects to be discharged to:: Private residence Living Arrangements: Spouse/significant other;Children Available Help at Discharge: Family;Available 24 hours/day Type of Home: Apartment Home Access: Stairs to enter Entrance Stairs-Rails: Right;Left;Can reach both Home Layout: One level Home Equipment: Walker - 2 wheels;Bedside commode;Wheelchair - manual      Prior Function Level of Independence: Independent      Comments: Ind amb community distances without an AD, no fall history, Ind with bathing/dressing, assist with heavy household chores   PT Goals (current goals can now be found in the care plan section) Acute Rehab PT Goals Patient Stated Goal: go home Progress towards PT goals: Progressing toward goals    Frequency    BID      PT Plan Current plan remains appropriate       AM-PAC PT "6 Clicks" Mobility   Outcome Measure  Help needed turning from your back to your side while in a flat bed without using bedrails?: A Lot Help needed moving from lying on your back to sitting on the side of a flat bed without using bedrails?: A Lot Help needed moving to and from a bed to a chair (including a wheelchair)?: A Lot Help needed standing up from a chair using your arms (e.g., wheelchair or bedside chair)?: A Lot Help needed to walk in hospital room?: Total Help needed climbing 3-5 steps with a railing? : Total 6 Click Score: 10    End of Session Equipment Utilized During Treatment: Gait belt Activity Tolerance: Patient  tolerated treatment well Patient left: in chair;with call bell/phone within reach;with chair alarm set;with family/visitor present Nurse Communication: Mobility status;Weight bearing status;Other (comment) PT Visit Diagnosis: Other abnormalities of gait and mobility (R26.89);Muscle weakness (generalized) (M62.81)     Time: 3382-5053 PT Time Calculation (min) (ACUTE ONLY): 44 min  Charges:  $Gait Training: 8-22 mins $Therapeutic Exercise: 8-22 mins $Therapeutic Activity: 23-37 mins                     Julaine Fusi PTA 05/04/20, 4:46 PM

## 2020-05-04 NOTE — Anesthesia Postprocedure Evaluation (Signed)
Anesthesia Post Note  Patient: Claire Hansen  Procedure(s) Performed: LEFT FEMORAL COMPONENT REVISION (Left Hip) APPLICATION OF CELL SAVER (N/A )  Patient location during evaluation: Nursing Unit Anesthesia Type: Spinal Level of consciousness: oriented and awake and alert Pain management: pain level controlled Vital Signs Assessment: post-procedure vital signs reviewed and stable Respiratory status: spontaneous breathing, respiratory function stable and patient connected to nasal cannula oxygen Cardiovascular status: blood pressure returned to baseline and stable Postop Assessment: no headache, no backache and no apparent nausea or vomiting Anesthetic complications: no   No complications documented.   Last Vitals:  Vitals:   05/03/20 1938 05/04/20 0418  BP: (!) 145/80 128/79  Pulse: (!) 102 (!) 105  Resp: 16 16  Temp: 36.9 C 37.1 C  SpO2: 98% 97%    Last Pain:  Vitals:   05/04/20 0628  TempSrc:   PainSc: 5                  Arita Miss

## 2020-05-04 NOTE — Plan of Care (Signed)
Patient had an uneventful shift. No changes in neurological and neurovascular assessments. Pain controlled with scheduled and PRN medications.Foley catheter removed @ 6 am and voiding trial commenced. Denies any needs at this time. All Safety measures maintained. Care continues.  Problem: Education: Goal: Knowledge of the prescribed therapeutic regimen will improve Outcome: Progressing Goal: Understanding of discharge needs will improve Outcome: Progressing Goal: Individualized Educational Video(s) Outcome: Progressing   Problem: Activity: Goal: Ability to avoid complications of mobility impairment will improve Outcome: Progressing Goal: Ability to tolerate increased activity will improve Outcome: Progressing   Problem: Clinical Measurements: Goal: Postoperative complications will be avoided or minimized Outcome: Progressing   Problem: Pain Management: Goal: Pain level will decrease with appropriate interventions Outcome: Progressing   Problem: Skin Integrity: Goal: Will show signs of wound healing Outcome: Progressing

## 2020-05-05 LAB — CBC
HCT: 25.3 % — ABNORMAL LOW (ref 36.0–46.0)
Hemoglobin: 8.5 g/dL — ABNORMAL LOW (ref 12.0–15.0)
MCH: 28.7 pg (ref 26.0–34.0)
MCHC: 33.6 g/dL (ref 30.0–36.0)
MCV: 85.5 fL (ref 80.0–100.0)
Platelets: 259 10*3/uL (ref 150–400)
RBC: 2.96 MIL/uL — ABNORMAL LOW (ref 3.87–5.11)
RDW: 13.8 % (ref 11.5–15.5)
WBC: 10.5 10*3/uL (ref 4.0–10.5)
nRBC: 0 % (ref 0.0–0.2)

## 2020-05-05 LAB — GLUCOSE, CAPILLARY
Glucose-Capillary: 220 mg/dL — ABNORMAL HIGH (ref 70–99)
Glucose-Capillary: 174 mg/dL — ABNORMAL HIGH (ref 70–99)
Glucose-Capillary: 166 mg/dL — ABNORMAL HIGH (ref 70–99)
Glucose-Capillary: 216 mg/dL — ABNORMAL HIGH (ref 70–99)

## 2020-05-05 MED ORDER — SODIUM CHLORIDE 0.9 % IV BOLUS
500.0000 mL | Freq: Once | INTRAVENOUS | Status: AC
  Administered 2020-05-05: 500 mL via INTRAVENOUS

## 2020-05-05 NOTE — Progress Notes (Addendum)
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    PT Comments    Pt ready for session.  In recliner.  Stood with min a x 1 and attempted gait but she is unable to maintain TTWB per MD orders.  Returned to sitting.  Session focused on transfers x 4 recliner <-> bed.  Extensive education and cues for transfers but by end of session she is able to transfer and overall maintain TTWB well with min a x 1.  She does better transferring to R vs L as her ability to move LLE remains poor and physical assist by writer is needed.  She is placed on RLE to increase length in attempts to make movement easier with little effect.  Discussed at length with pt and husband.  She wishes to go home but is open to SNF is necessary.  She has 2 flights of stairs to enter home which she is unable to manage traditionally with TTWB status.  Will try steps tomorrow seated with husband and son but EMS transport home and to/from appointments would be most appropriate if they chose home as seated will still be very cumbersome for pt.  Pt being wheelchair level at home is reasonable as family can provide support at home if stair barrier is overcome.   Follow Up Recommendations  SNF;Other (comment)     Equipment Recommendations  None recommended by PT    Recommendations for Other Services       Precautions / Restrictions Precautions Precautions: Fall;Anterior Hip Precaution Booklet Issued: Yes (comment) Restrictions Weight Bearing Restrictions: Yes LLE Weight Bearing: Touchdown weight bearing Other Position/Activity Restrictions: Recent peroneal nerve  decompression to the L lateral lower leg, watch incision    Mobility  Bed Mobility               General bed mobility comments: in recliner pre/post session    Transfers Overall transfer level: Needs assistance Equipment used: Rolling walker (2 wheeled) Transfers: Sit to/from Stand Sit to Stand: Min assist         General transfer comment: verbal cues for hand placements with min a for support/safety  Ambulation/Gait             General Gait Details: Poor ability to maintaoin proper wt bearing. Distance limited by wt bearing and pain. flex posture. Pt lives on 3rd story apartment.  limited to transfers only by Corporate investment banker [employer] (Stroke Patients Only)       Balance Overall balance assessment: Needs assistance Sitting-balance support: No upper extremity supported Sitting balance-Leahy Scale: Normal     Standing balance support: Bilateral upper extremity supported;During functional activity Standing balance-Leahy Scale: Fair Standing balance comment: no balance deficits however uses BUE extensively on walker for fupport                            Cognition Arousal/Alertness: Awake/alert Behavior During Therapy: WFL for tasks assessed/performed Overall Cognitive Status: Within Functional Limits for tasks assessed  Exercises Other Exercises Other Exercises: seated AROM RLE, AAROM LLE x 10    General Comments        Pertinent Vitals/Pain Pain Assessment: Faces Faces Pain Scale: Hurts even more Pain Location: L hip with mobility Pain Descriptors / Indicators: Sore Pain Intervention(s): Limited activity within patient's tolerance;Monitored during session;Repositioned    Home Living                      Prior Function            PT Goals (current goals can now be found in the care plan section) Progress towards PT  goals: Progressing toward goals    Frequency    BID      PT Plan Current plan remains appropriate    Co-evaluation              AM-PAC PT "6 Clicks" Mobility   Outcome Measure  Help needed turning from your back to your side while in a flat bed without using bedrails?: A Lot Help needed moving from lying on your back to sitting on the side of a flat bed without using bedrails?: A Lot Help needed moving to and from a bed to a chair (including a wheelchair)?: A Little Help needed standing up from a chair using your arms (e.g., wheelchair or bedside chair)?: A Little Help needed to walk in hospital room?: Total Help needed climbing 3-5 steps with a railing? : Total 6 Click Score: 12    End of Session Equipment Utilized During Treatment: Gait belt Activity Tolerance: Patient tolerated treatment well Patient left: in chair;with call bell/phone within reach;with chair alarm set;with family/visitor present Nurse Communication: Mobility status;Weight bearing status;Other (comment) PT Visit Diagnosis: Other abnormalities of gait and mobility (R26.89);Muscle weakness (generalized) (M62.81)     Time: 0917-1000 PT Time Calculation (min) (ACUTE ONLY): 43 min  Charges:  $Therapeutic Exercise: 8-22 mins $Therapeutic Activity: 23-37 mins                    Chesley Noon, PTA 05/05/20, 10:43 AM

## 2020-05-05 NOTE — Progress Notes (Signed)
   Subjective: 2 Days Post-Op Procedure(s) (LRB): LEFT FEMORAL COMPONENT REVISION (Left) APPLICATION OF CELL SAVER (N/A) Patient reports pain as 5 on 0-10 scale.   Patient is well, and has had no acute complaints or problems  HR 110, admits to not drinking a lot of fluids. Denies any CP, SOB, ABD pain. We will continue therapy today.   Objective: Vital signs in last 24 hours: Temp:  [98.1 F (36.7 C)-99.5 F (37.5 C)] 98.1 F (36.7 C) (04/10 0841) Pulse Rate:  [98-110] 110 (04/10 0841) Resp:  [16-20] 20 (04/10 0841) BP: (111-138)/(66-113) 111/66 (04/10 0841) SpO2:  [96 %-100 %] 100 % (04/10 0841)  Intake/Output from previous day: 04/09 0701 - 04/10 0700 In: 720 [P.O.:720] Out: 350 [Urine:350] Intake/Output this shift: No intake/output data recorded.  Recent Labs    05/03/20 1252 05/04/20 0517 05/05/20 0413  HGB 12.0 10.2* 8.5*   Recent Labs    05/04/20 0517 05/05/20 0413  WBC 9.8 10.5  RBC 3.47* 2.96*  HCT 29.1* 25.3*  PLT 289 259   Recent Labs    05/03/20 1252 05/04/20 0517  NA  --  135  K  --  4.0  CL  --  101  CO2  --  25  BUN  --  24*  CREATININE 1.00 1.34*  GLUCOSE  --  235*  CALCIUM  --  9.1   No results for input(s): LABPT, INR in the last 72 hours.  EXAM General - Patient is Alert, Appropriate and Oriented Extremity - Neurovascular intact Sensation intact distally Intact pulses distally No cellulitis present Compartment soft  Lacks dorsiflexion left LE. preexisting  Dressing - dressing C/D/I and no drainage, prevena intact with 50cc drainage Motor Function - intact, toes well on exam.   Past Medical History:  Diagnosis Date  . Allergy   . Anxiety   . Asthma    pt denies having asthma  . Chronic osteoarthritis    knees  . Deviated septum   . Diabetes mellitus without complication ([place])   . Hyperlipidemia   . Hypertension   . Irregular menstrual cycle   . Lymphadenopathy   . Obesity   . Peri-menopause   . Pneumonia 2015     Assessment/Plan:   2 Days Post-Op Procedure(s) (LRB): LEFT FEMORAL COMPONENT REVISION (Left) APPLICATION OF CELL SAVER (N/A) Active Problems:   S/P revision of total hip  Estimated body mass index is 39.94 kg/m as calculated from the following:   Height as of this encounter: 5\' 5"  (1.651 m).   Weight as of this encounter: 108.9 kg. Advance diet Up with therapy, toe-touch weightbearing left lower extremity x4 weeks Pain well controlled Tachycardia - continue with metoprolol. 500cc bolus. No CP/SOB. Continue to monitor Acute post op blood loss anemia  - Hgb 8.5, Continue with iron supplement.  Recheck labs in the morning. Transfuse if Hgb <7 Care management to assist with discharge. Patient prefers home with HHPT, hopefully by Monday she will be ready.  DVT Prophylaxis - Lovenox, TED hose and SCDs   T. Rachelle Hora, PA-C [place] 05/05/2020, 8:59 AM

## 2020-05-05 NOTE — Progress Notes (Signed)
Orthopedic Tech Progress Note Patient Details:  DEVONNE KITCHEN 26-Jul-1960 [id number] Ordered pts AFO Patient ID: Elveria Rising, female   DOB: 07-10-1960, 60 y.o.   MRN: [id number]   Tammy Sours 05/05/2020, 2:39 PM

## 2020-05-05 NOTE — Plan of Care (Signed)
Patient had an uneventful shift. No changes in neurological and neurovascular assessments. Pain controlled with scheduled medications. Vital signs within normal range.Denies any needs at this time. All Safety measures maintained. Care continues.  Problem: Education: Goal: Knowledge of the prescribed therapeutic regimen will improve Outcome: Progressing Goal: Understanding of discharge needs will improve Outcome: Progressing Goal: Individualized Educational Video(s) Outcome: Progressing   Problem: Activity: Goal: Ability to avoid complications of mobility impairment will improve Outcome: Progressing Goal: Ability to tolerate increased activity will improve Outcome: Progressing   Problem: Clinical Measurements: Goal: Postoperative complications will be avoided or minimized Outcome: Progressing   Problem: Pain Management: Goal: Pain level will decrease with appropriate interventions Outcome: Progressing   Problem: Skin Integrity: Goal: Will show signs of wound healing Outcome: Progressing

## 2020-05-05 NOTE — Progress Notes (Signed)
Ankle foot orthosis ordered by secretary 2:52 PM

## 2020-05-06 ENCOUNTER — Encounter: Payer: Self-pay | Admitting: Orthopedic Surgery

## 2020-05-06 LAB — CBC
HCT: 26.3 % — ABNORMAL LOW (ref 36.0–46.0)
Hemoglobin: 9 g/dL — ABNORMAL LOW (ref 12.0–15.0)
MCH: 29 pg (ref 26.0–34.0)
MCHC: 34.2 g/dL (ref 30.0–36.0)
MCV: 84.8 fL (ref 80.0–100.0)
Platelets: 290 10*3/uL (ref 150–400)
RBC: 3.1 MIL/uL — ABNORMAL LOW (ref 3.87–5.11)
RDW: 13.8 % (ref 11.5–15.5)
WBC: 10.3 10*3/uL (ref 4.0–10.5)
nRBC: 0 % (ref 0.0–0.2)

## 2020-05-06 LAB — GLUCOSE, CAPILLARY
Glucose-Capillary: 178 mg/dL — ABNORMAL HIGH (ref 70–99)
Glucose-Capillary: 211 mg/dL — ABNORMAL HIGH (ref 70–99)
Glucose-Capillary: 162 mg/dL — ABNORMAL HIGH (ref 70–99)
Glucose-Capillary: 186 mg/dL — ABNORMAL HIGH (ref 70–99)

## 2020-05-06 NOTE — NC FL2 (Signed)
[place] LEVEL OF CARE SCREENING TOOL     IDENTIFICATION  Patient Name: Claire Hansen Birthdate: 1960/05/11 Sex: female Admission Date (Current Location): 05/03/2020  [place] and [place] Number:  Engineering geologist and Address:  [facility], [address], [place], [place] [address]      Provider Number: [id number]  Attending Physician Name and Address:  Hessie Knows, MD  Relative Name and Phone Number:  Casimiro Needle [phone]    Current Level of Care: Hospital Recommended Level of Care: [place] Prior Approval Number:    Date Approved/Denied:   PASRR Number: [id number]  Discharge Plan: SNF    Current Diagnoses: Patient Active Problem List   Diagnosis Date Noted  . 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  . Leg pain, anterior, left 03/22/2020  . Primary localized osteoarthritis of left hip 07/01/2017  . Primary localized osteoarthritis of right hip 11/05/2016  . Hyperlipidemia 08/13/2014  . History of asthma 08/13/2014  . Resistant hypertension 08/13/2014  . Microalbuminuria 08/13/2014  . Morbid obesity with BMI of 45.0-49.9, adult ([place]) 08/13/2014  . Osteoarthritis of right knee 08/13/2014  . Allergic rhinitis, seasonal 08/13/2014  . Diabetes mellitus with renal manifestation ([place]) 11/06/2010    Orientation RESPIRATION BLADDER Height & Weight     Self,Time,Situation,Place  Normal Continent Weight: 108.9 kg Height:  5\' 5"  (165.1 cm)  BEHAVIORAL SYMPTOMS/MOOD NEUROLOGICAL BOWEL NUTRITION STATUS      Continent Diet (Carb Modified)  AMBULATORY STATUS COMMUNICATION OF NEEDS Skin   Extensive Assist Verbally Surgical wounds                       Personal Care Assistance Level of Assistance  Bathing,Dressing Bathing Assistance: Maximum assistance   Dressing Assistance: Maximum assistance     Functional Limitations Info             SPECIAL CARE FACTORS FREQUENCY  PT (By licensed PT)      PT Frequency: 5 times per week              Contractures Contractures Info: Not present    Additional Factors Info  Code Status,Allergies,Insulin Sliding Scale Code Status Info: FUll code Allergies Info: [place]   Insulin Sliding Scale Info: CBG 121 - 150: 2 units   CBG 151 - 200: 3 units   CBG 201 - 250: 5 units   CBG 251 - 300: 8 units   CBG 301 - 350: 11 units   CBG 351 - 400: 15 units       Current Medications (05/06/2020):  This is the current hospital active medication list Current Facility-Administered Medications  Medication Dose Route Frequency Provider Last Rate Last Admin  . 0.9 %  sodium chloride infusion   Intravenous Continuous Alvin Critchley, MD 50 mL/hr at 05/04/20 0346 Infusion Verify at 05/04/20 0346  . acetaminophen (TYLENOL) tablet 325-650 mg  325-650 mg Oral Q6H PRN Hessie Knows, MD      . alum & mag hydroxide-simeth (MAALOX/MYLANTA) 200-200-20 MG/5ML suspension 30 mL  30 mL Oral Q4H PRN Hessie Knows, MD      . irbesartan Levy Sjogren) tablet 300 mg  300 mg Oral Daily Hessie Knows, MD   300 mg at 05/05/20 1050   And  . amLODipine (NORVASC) tablet 10 mg  10 mg Oral Daily Hessie Knows, MD   10 mg at 05/05/20 1049   And  . hydrochlorothiazide (HYDRODIURIL) tablet 25 mg  25 mg Oral Daily Rudene Christians,  Legrand Como, MD   25 mg at 05/05/20 1050  . bisacodyl (DULCOLAX) suppository 10 mg  10 mg Rectal Daily PRN Hessie Knows, MD      . diphenhydrAMINE (BENADRYL) 12.5 MG/5ML elixir 12.5-25 mg  12.5-25 mg Oral Q4H PRN Hessie Knows, MD      . docusate sodium (COLACE) capsule 100 mg  100 mg Oral BID Hessie Knows, MD   100 mg at 05/05/20 2138  . enoxaparin (LOVENOX) injection 40 mg  40 mg Subcutaneous Q24H Hessie Knows, MD   40 mg at 05/05/20 1051  . famotidine (PEPCID) tablet 20 mg  20 mg Oral Once Honor Loh E, NP      . feeding supplement (GLUCERNA SHAKE) (GLUCERNA SHAKE) liquid 237 mL  237 mL Oral BID BM Hessie Knows, MD   237 mL at 05/05/20 1435  . ferrous  [id number] C-folic acid (TRINSICON / FOLTRIN) capsule 1 capsule  1 capsule Oral BID Duanne Guess, PA-C   1 capsule at 05/05/20 2139  . gabapentin (NEURONTIN) tablet 300 mg  300 mg Oral TID Hessie Knows, MD   300 mg at 05/06/20 0806  . hydrALAZINE (APRESOLINE) tablet 10 mg  10 mg Oral Daily Hessie Knows, MD   10 mg at 05/05/20 1114  . HYDROmorphone (DILAUDID) injection 0.5-1 mg  0.5-1 mg Intravenous Q4H PRN Hessie Knows, MD      . insulin aspart (novoLOG) injection 0-15 Units  0-15 Units Subcutaneous TID WC Hessie Knows, MD   3 Units at 05/06/20 0806  . magnesium citrate solution 1 Bottle  1 Bottle Oral Once PRN Hessie Knows, MD      . magnesium hydroxide (MILK OF MAGNESIA) suspension 30 mL  30 mL Oral Daily PRN Hessie Knows, MD      . menthol-cetylpyridinium (CEPACOL) lozenge 3 mg  1 lozenge Oral PRN Hessie Knows, MD       Or  . phenol (CHLORASEPTIC) mouth spray 1 spray  1 spray Mouth/Throat PRN Hessie Knows, MD      . metFORMIN (GLUCOPHAGE-XR) 24 hr tablet 500 mg  500 mg Oral BID Hessie Knows, MD   500 mg at 05/06/20 0805  . methocarbamol (ROBAXIN) tablet 500 mg  500 mg Oral Q6H PRN Hessie Knows, MD   500 mg at 05/05/20 0535   Or  . methocarbamol (ROBAXIN) 500 mg in dextrose 5 % 50 mL IVPB  500 mg Intravenous Q6H PRN Hessie Knows, MD      . metoCLOPramide (REGLAN) tablet 5-10 mg  5-10 mg Oral Q8H PRN Hessie Knows, MD       Or  . metoCLOPramide (REGLAN) injection 5-10 mg  5-10 mg Intravenous Q8H PRN Hessie Knows, MD      . metoprolol succinate (TOPROL-XL) 24 hr tablet 25 mg  25 mg Oral Daily Hessie Knows, MD   25 mg at 05/05/20 1225  . montelukast (SINGULAIR) tablet 10 mg  10 mg Oral Daily PRN Hessie Knows, MD      . multivitamin with minerals tablet 1 tablet  1 tablet Oral Daily Hessie Knows, MD   1 tablet at 05/05/20 1050  . ondansetron (ZOFRAN) tablet 4 mg  4 mg Oral Q6H PRN Hessie Knows, MD       Or  . ondansetron [facility]) injection 4 mg  4 mg Intravenous Q6H  PRN Hessie Knows, MD      . oxyCODONE (Oxy IR/ROXICODONE) immediate release tablet 10-15 mg  10-15 mg Oral Q4H PRN Hessie Knows, MD   10 mg at  05/04/20 0543  . oxyCODONE (Oxy IR/ROXICODONE) immediate release tablet 5-10 mg  5-10 mg Oral Q4H PRN Hessie Knows, MD   5 mg at 05/05/20 2143  . pantoprazole (PROTONIX) EC tablet 40 mg  40 mg Oral Daily Hessie Knows, MD   40 mg at 05/05/20 1050  . protein supplement (ENSURE MAX) liquid  11 oz Oral QHS Hessie Knows, MD   11 oz at 05/03/20 2139  . simethicone (MYLICON) chewable tablet 80 mg  80 mg Oral QID PRN Hessie Knows, MD   80 mg at 05/04/20 0622  . spironolactone (ALDACTONE) tablet 25 mg  25 mg Oral Daily Hessie Knows, MD   25 mg at 05/05/20 1050  . traMADol (ULTRAM) tablet 50 mg  50 mg Oral Q6H Hessie Knows, MD   50 mg at 05/06/20 0816  . zolpidem (AMBIEN) tablet 5 mg  5 mg Oral QHS PRN Hessie Knows, MD         Discharge Medications: Please see discharge summary for a list of discharge medications.  Relevant Imaging Results:  Relevant Lab Results:   Additional Information SS [id number]  Su Hilt, RN

## 2020-05-06 NOTE — Plan of Care (Signed)
Patient had an uneventful shift. No changes in neurological and neurovascular assessments. Pain controlled with PRN and scheduled  medications. Vital signs within normal range.Denies any needs at this time. All Safety measures maintained. Care continues.  Problem: Education: Goal: Knowledge of the prescribed therapeutic regimen will improve Outcome: Progressing Goal: Understanding of discharge needs will improve Outcome: Progressing Goal: Individualized Educational Video(s) Outcome: Progressing   Problem: Activity: Goal: Ability to avoid complications of mobility impairment will improve Outcome: Progressing Goal: Ability to tolerate increased activity will improve Outcome: Progressing   Problem: Clinical Measurements: Goal: Postoperative complications will be avoided or minimized Outcome: Progressing   Problem: Pain Management: Goal: Pain level will decrease with appropriate interventions Outcome: Progressing   Problem: Skin Integrity: Goal: Will show signs of wound healing Outcome: Progressing

## 2020-05-06 NOTE — Progress Notes (Signed)
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    PT Comments    Pt sitting EOB upon arrival.  Was helped to commode by family.  AFO arrived and donned for session.  Session focused on standing exercises for LLE.  Movements small and effortful but improved with AFO that keep foot in a more neutral position to allow for movement.  She self initiates frequent and appropriate rest breaks when needed.  Gait remains limited by inability to maintain TTWB outside of transfers.  Discussed at length with pt, husband and son discharge planning.  MD prefers SNF.  Pt is agreeable but still prefers home.  Discussed primary barrier being 3rd floor apartment and inability to manage stairs.  Family voiced concerns over care but seemed willing to give SNF a try.     Follow Up Recommendations  SNF;Other (comment)     Equipment Recommendations  None recommended by PT    Recommendations for Other Services       Precautions / Restrictions Precautions Precautions: Fall;Anterior Hip Precaution Booklet Issued: Yes (comment) Restrictions Weight Bearing Restrictions: Yes LLE Weight Bearing: Touchdown weight bearing Other Position/Activity Restrictions: Recent peroneal nerve decompression to the L lateral lower leg, watch incision    Mobility  Bed Mobility               General bed mobility comments: sitting EOB upon arrival    Transfers Overall transfer level: Needs assistance Equipment used: Rolling walker (2 wheeled) Transfers: Sit to/from Stand Sit to Stand: Min assist         General transfer comment: verbal cues for  hand placements with min a for support/safety, increased time  Ambulation/Gait Ambulation/Gait assistance: Min guard;Min assist Gait Distance (Feet): 3 Feet Assistive device: Rolling walker (2 wheeled) Gait Pattern/deviations: Step-to pattern Gait velocity: decreased   General Gait Details: Poor ability to maintaoin proper wt bearing. Distance limited by wt bearing and pain. flex posture. Pt lives on 3rd story apartment.  limited to transfers only by Corporate investment banker Rankin (Stroke Patients Only)       Balance Overall balance assessment: Needs assistance Sitting-balance support: No upper extremity supported Sitting balance-Leahy Scale: Normal     Standing balance support: Bilateral upper extremity supported;During functional activity Standing balance-Leahy Scale: Fair Standing balance comment: no balance deficits however uses BUE extensively on walker for fupport                            Cognition Arousal/Alertness: Awake/alert Behavior During Therapy: WFL for tasks assessed/performed Overall Cognitive Status: Within Functional Limits for tasks assessed                                        Exercises Other Exercises Other Exercises: standing 3 x 10 ab/add, marches, SLR with LLE only.  Small effortful movements.    General Comments  Pertinent Vitals/Pain Pain Assessment: Faces Faces Pain Scale: Hurts little more Pain Location: L hip with mobility Pain Descriptors / Indicators: Sore Pain Intervention(s): Limited activity within patient's tolerance;Monitored during session;Repositioned    Home Living                      Prior Function            PT Goals (current goals can now be found in the care plan section) Progress towards PT goals: Progressing toward goals    Frequency    BID      PT Plan Current plan remains appropriate    Co-evaluation               AM-PAC PT "6 Clicks" Mobility   Outcome Measure  Help needed turning from your back to your side while in a flat bed without using bedrails?: A Lot Help needed moving from lying on your back to sitting on the side of a flat bed without using bedrails?: A Lot Help needed moving to and from a bed to a chair (including a wheelchair)?: A Little Help needed standing up from a chair using your arms (e.g., wheelchair or bedside chair)?: A Little Help needed to walk in hospital room?: A Lot Help needed climbing 3-5 steps with a railing? : Total 6 Click Score: 13    End of Session Equipment Utilized During Treatment: Gait belt Activity Tolerance: Patient tolerated treatment well Patient left: in chair;with call bell/phone within reach;with chair alarm set;with family/visitor present Nurse Communication: Mobility status;Weight bearing status;Other (comment) PT Visit Diagnosis: Other abnormalities of gait and mobility (R26.89);Muscle weakness (generalized) (M62.81)     Time: 4492-0100 PT Time Calculation (min) (ACUTE ONLY): 42 min  Charges:  $Therapeutic Exercise: 23-37 mins $Therapeutic Activity: 8-22 mins                    Chesley Noon, PTA 05/06/20, 10:30 AM

## 2020-05-06 NOTE — Progress Notes (Signed)
   Subjective: 3 Days Post-Op Procedure(s) (LRB): LEFT FEMORAL COMPONENT REVISION (Left) APPLICATION OF CELL SAVER (N/A) Patient reports pain as mild.   Patient is well, and has had no acute complaints or problems  HR 103, stable Denies any CP, SOB, ABD pain. We will continue therapy today.   Objective: Vital signs in last 24 hours: Temp:  [98.1 F (36.7 C)-98.8 F (37.1 C)] 98.4 F (36.9 C) (04/11 0737) Pulse Rate:  [92-110] 103 (04/11 0737) Resp:  [16-20] 19 (04/11 0737) BP: (111-153)/(66-84) 148/84 (04/11 0737) SpO2:  [97 %-100 %] 99 % (04/11 0737)  Intake/Output from previous day: 04/10 0701 - 04/11 0700 In: 600 [P.O.:600] Out: 600 [Urine:600] Intake/Output this shift: No intake/output data recorded.  Recent Labs    05/03/20 1252 05/04/20 0517 05/05/20 0413  HGB 12.0 10.2* 8.5*   Recent Labs    05/04/20 0517 05/05/20 0413  WBC 9.8 10.5  RBC 3.47* 2.96*  HCT 29.1* 25.3*  PLT 289 259   Recent Labs    05/03/20 1252 05/04/20 0517  NA  --  135  K  --  4.0  CL  --  101  CO2  --  25  BUN  --  24*  CREATININE 1.00 1.34*  GLUCOSE  --  235*  CALCIUM  --  9.1   No results for input(s): LABPT, INR in the last 72 hours.  EXAM General - Patient is Alert, Appropriate and Oriented Extremity - Neurovascular intact Sensation intact distally Intact pulses distally No cellulitis present Compartment soft  Lacks dorsiflexion left LE. preexisting  Dressing - dressing C/D/I and no drainage, prevena intact with 100cc drainage Motor Function - intact, toes well on exam.   Past Medical History:  Diagnosis Date  . Allergy   . Anxiety   . Asthma    pt denies having asthma  . Chronic osteoarthritis    knees  . Deviated septum   . Diabetes mellitus without complication ([place])   . Hyperlipidemia   . Hypertension   . Irregular menstrual cycle   . Lymphadenopathy   . Obesity   . Peri-menopause   . Pneumonia 2015    Assessment/Plan:   3 Days Post-Op  Procedure(s) (LRB): LEFT FEMORAL COMPONENT REVISION (Left) APPLICATION OF CELL SAVER (N/A) Active Problems:   S/P revision of total hip  Estimated body mass index is 39.94 kg/m as calculated from the following:   Height as of this encounter: 5\' 5"  (1.651 m).   Weight as of this encounter: 108.9 kg. Advance diet Up with therapy, toe-touch weightbearing left lower extremity x4 weeks Pain well controlled Tachycardia - continue with metoprolol. Stable. No CP/SOB. Continue to monitor HR Acute post op blood loss anemia  - Hgb 8.5, Continue with iron supplement.  Am labs pending. Transfuse if Hgb <7 Care management to assist with discharge. Patient prefers home with HHPT, PT recommending SNF  DVT Prophylaxis - Lovenox, TED hose and SCDs   T. Rachelle Hora, PA-C [place] 05/06/2020, 7:54 AM

## 2020-05-06 NOTE — Progress Notes (Signed)
Patient lives at home with her spouse in an apartment on the 3rd floor with multiple steps to enter, no elevator available She has a RW and a BSC at home. She has not done well with PTY and will need to go to SNF for short term rehab, PASSR number [id number] A Bed Search sent, FL2 completed, will review bed offers with patient once obtained

## 2020-05-06 NOTE — Progress Notes (Signed)
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    PT Comments    Pt in chair.  Husband in attendance.  Upon arrival they stated they have decided to go home vs SNF.  Extensive discussions have been had in regards to MD recommendations and preference for SNF.  Stairs remain barrier but she plans to have son and friends carry her up in a wheelchair to access home and appointments.  Session focused on standing tolerance and standing exercises.  AFO and shoes donned for session.  Pt was given a gait belt for increased safety in the home.  Discussed choice with TOC and will message MD regarding their decision.  SNF remains appropriate given stairs but pt and family choice is home at this time.  Pt does seem to have some cognitive deficits that are not initially obvious when talking to her.  After 2 days of discussion she remains unable to grasp why she cannot maintain WB with 2 flights of stairs and asks often why she cannot despite multiple lengthy discussions and demonstrations of technique.  Husband however is very supportive and has been transferring pt while her to/from commode and bed.     Follow Up Recommendations  SNF;Other (comment)     Equipment Recommendations  None recommended by PT    Recommendations for Other Services       Precautions / Restrictions Precautions Precautions: Fall;Anterior Hip Precaution Booklet Issued: Yes (comment) Restrictions Weight Bearing Restrictions: Yes LLE Weight Bearing: Touchdown weight bearing Other Position/Activity Restrictions: Recent peroneal nerve decompression to the L  lateral lower leg, watch incision    Mobility  Bed Mobility               General bed mobility comments: sitting EOB upon arrival    Transfers Overall transfer level: Needs assistance Equipment used: Rolling walker (2 wheeled) Transfers: Sit to/from Stand Sit to Stand: Min assist         General transfer comment: verbal cues for hand placements with min a for support/safety, increased time  Ambulation/Gait         Gait velocity: decreased       Stairs             Wheelchair Mobility    Modified Rankin (Stroke Patients Only)       Balance Overall balance assessment: Needs assistance Sitting-balance support: No upper extremity supported Sitting balance-Leahy Scale: Normal Sitting balance - Comments: no balance deficits in sitting   Standing balance support: Bilateral upper extremity supported;During functional activity Standing balance-Leahy Scale: Fair Standing balance comment: no balance deficits however uses BUE extensively on walker for fupport                            Cognition Arousal/Alertness: Awake/alert Behavior During Therapy: WFL for tasks assessed/performed Overall Cognitive Status: Within Functional Limits for tasks assessed                                 General Comments: talking with pt more over the past  2 days she does seem to have some cognitive deficits that are not initially obvious      Exercises Other Exercises Other Exercises: standing 3 x 10 ab/add, marches, SLR with LLE only.  Small effortful movements. Other Exercises: static standing up to 3 minutes    General Comments        Pertinent Vitals/Pain Pain Assessment: Faces Faces Pain Scale: Hurts a little bit Pain Location: L hip with mobility Pain Intervention(s): Limited activity within patient's tolerance;Monitored during session;Repositioned    Home Living                      Prior Function            PT Goals  (current goals can now be found in the care plan section) Progress towards PT goals: Progressing toward goals    Frequency    BID      PT Plan Current plan remains appropriate;Other (comment)    Co-evaluation              AM-PAC PT "6 Clicks" Mobility   Outcome Measure  Help needed turning from your back to your side while in a flat bed without using bedrails?: A Lot Help needed moving from lying on your back to sitting on the side of a flat bed without using bedrails?: A Lot Help needed moving to and from a bed to a chair (including a wheelchair)?: A Little Help needed standing up from a chair using your arms (e.g., wheelchair or bedside chair)?: A Little Help needed to walk in hospital room?: A Lot Help needed climbing 3-5 steps with a railing? : Total 6 Click Score: 13    End of Session Equipment Utilized During Treatment: Gait belt Activity Tolerance: Patient tolerated treatment well Patient left: in chair;with call bell/phone within reach;with chair alarm set;with family/visitor present Nurse Communication: Mobility status;Weight bearing status;Other (comment) PT Visit Diagnosis: Other abnormalities of gait and mobility (R26.89);Muscle weakness (generalized) (M62.81)     Time: 9373-4287 PT Time Calculation (min) (ACUTE ONLY): 23 min  Charges:  $Therapeutic Exercise: 8-22 mins $Therapeutic Activity: 8-22 mins                    Chesley Noon, PTA 05/06/20, 2:32 PM

## 2020-05-07 LAB — GLUCOSE, CAPILLARY
Glucose-Capillary: 166 mg/dL — ABNORMAL HIGH (ref 70–99)
Glucose-Capillary: 173 mg/dL — ABNORMAL HIGH (ref 70–99)

## 2020-05-07 MED ORDER — TRAMADOL HCL 50 MG PO TABS: 50.0000 mg | ORAL_TABLET | Freq: Four times a day (QID) | ORAL | 0 refills | Status: DC | PRN

## 2020-05-07 MED ORDER — ENOXAPARIN SODIUM 40 MG/0.4ML ~~LOC~~ SOLN: 40.0000 mg | SUBCUTANEOUS | 0 refills | Status: DC

## 2020-05-07 MED ORDER — METHOCARBAMOL 500 MG PO TABS: 500.0000 mg | ORAL_TABLET | Freq: Four times a day (QID) | ORAL | 0 refills | Status: DC | PRN

## 2020-05-07 MED ORDER — OXYCODONE HCL 5 MG PO TABS: 5.0000 mg | ORAL_TABLET | ORAL | 0 refills | Status: DC | PRN

## 2020-05-07 MED ORDER — DOCUSATE SODIUM 100 MG PO CAPS: 100.0000 mg | ORAL_CAPSULE | Freq: Two times a day (BID) | ORAL | 0 refills | Status: DC

## 2020-05-07 MED ORDER — FE FUMARATE-B12-VIT C-FA-IFC PO CAPS: 1.0000 | ORAL_CAPSULE | Freq: Two times a day (BID) | ORAL | 0 refills | Status: DC

## 2020-05-07 NOTE — TOC Progression Note (Signed)
Transition of Care [facility]) - Progression Note    Patient Details  Name: Claire Hansen MRN: [id number] Date of Birth: 01-13-1961  Transition of Care [facility]) CM/SW [place], RN Phone Number: 05/07/2020, 2:58 PM  Clinical Narrative:    Spoke to EMS director, Gwyndolyn Saxon who confirmed [facility] would not be able to get patient up the stairs on the stretcher however they would take her to the home and call for assistance getting up the stairs. Husband is in agreeance.    Expected Discharge Plan: [place] Barriers to Discharge: Continued Medical Work up  Expected Discharge Plan and Services Expected Discharge Plan: [place]   Discharge Planning Services: CM Consult   Living arrangements for the past 2 months: Apartment Expected Discharge Date: 05/07/20               DME Arranged: N/A         HH Arranged: NA           Social Determinants of Health (SDOH) Interventions    Readmission Risk Interventions No flowsheet data found.

## 2020-05-07 NOTE — TOC Progression Note (Signed)
Transition of Care [facility]) - Progression Note    Patient Details  Name: Claire Hansen MRN: [id number] Date of Birth: 1960/11/06  Transition of Care [facility]) CM/SW [place], RN Phone Number: 05/07/2020, 1:02 PM  Clinical Narrative:    Patient has notified me that they can not afford the copay to go to SNF and wants to go home, she is aware that she will not have [place] services at home, she will call to get Out patient PT arranged Will use EMS to transport for safety    Expected Discharge Plan: [place] Barriers to Discharge: Continued Medical Work up  Expected Discharge Plan and Services Expected Discharge Plan: [place]   Discharge Planning Services: CM Consult   Living arrangements for the past 2 months: Apartment Expected Discharge Date: 05/07/20               DME Arranged: N/A         HH Arranged: NA           Social Determinants of Health (SDOH) Interventions    Readmission Risk Interventions No flowsheet data found.

## 2020-05-07 NOTE — Progress Notes (Signed)
Pt d/c'd home, provided discharge education and discharge instructions. Discharge paperwork given to  patient and spouse, patient and spouse verbalized understanding of discharge education.  PIV removed, prevena woundvac in place,  Pt off unit via WC accompanied by EMS personal and spouse.  Claire Hansen [name] 3:43 PM

## 2020-05-07 NOTE — TOC Progression Note (Signed)
Transition of Care [facility]) - Progression Note    Patient Details  Name: Claire Hansen MRN: [id number] Date of Birth: 1960-07-27  Transition of Care [facility]) CM/SW [place], RN Phone Number: 05/07/2020, 3:30 PM  Clinical Narrative:    Ems came to pick up the patient but was not able to take her up 3 flights of steps, the patient did not want to pay any amount for EMS so they did not take the patient, EMS wheeled the patient out in a wheelchair to get into her sons private vehicle and the patient and family will get the patient up the stairs into her apartment.  The patient and her husband were bother strongly encouraged to go to Rehab for safety concerns, I explained that if there was a fire then it would pose a big life threat on being able to get out of the home, Due to the cost of the copay they declined, I reexplained to them that they will not have Home health and Made sure that Dr Rudene Christians was also aware. The patient and her husband stated understanding   Expected Discharge Plan: Skilled Nursing Facility Barriers to Discharge: Continued Medical Work up  Expected Discharge Plan and Services Expected Discharge Plan: [place]   Discharge Planning Services: CM Consult   Living arrangements for the past 2 months: Apartment Expected Discharge Date: 05/07/20               DME Arranged: N/A         HH Arranged: NA           Social Determinants of Health (SDOH) Interventions    Readmission Risk Interventions No flowsheet data found.

## 2020-05-07 NOTE — Progress Notes (Signed)
   Subjective: 4 Days Post-Op Procedure(s) (LRB): LEFT FEMORAL COMPONENT REVISION (Left) APPLICATION OF CELL SAVER (N/A) Patient reports pain as mild.   Patient is well, and has had no acute complaints or problems  HR 90, improved Denies any CP, SOB, ABD pain. We will continue therapy today.   Objective: Vital signs in last 24 hours: Temp:  [98.2 F (36.8 C)-98.4 F (36.9 C)] 98.3 F (36.8 C) (04/12 0523) Pulse Rate:  [90-103] 90 (04/12 0523) Resp:  [17-19] 17 (04/12 0523) BP: (133-162)/(70-86) 162/86 (04/12 0523) SpO2:  [95 %-100 %] 100 % (04/12 0523)  Intake/Output from previous day: 04/11 0701 - 04/12 0700 In: 540 [P.O.:540] Out: -  Intake/Output this shift: No intake/output data recorded.  Recent Labs    05/05/20 0413 05/06/20 0813  HGB 8.5* 9.0*   Recent Labs    05/05/20 0413 05/06/20 0813  WBC 10.5 10.3  RBC 2.96* 3.10*  HCT 25.3* 26.3*  PLT 259 290   No results for input(s): NA, K, CL, CO2, BUN, CREATININE, GLUCOSE, CALCIUM in the last 72 hours. No results for input(s): LABPT, INR in the last 72 hours.  EXAM General - Patient is Alert, Appropriate and Oriented Extremity - Neurovascular intact Sensation intact distally Intact pulses distally No cellulitis present Compartment soft  Lacks dorsiflexion left LE. preexisting  Dressing - dressing C/D/I and no drainage, prevena intact with 100cc drainage Motor Function - intact, toes well on exam.   Past Medical History:  Diagnosis Date  . Allergy   . Anxiety   . Asthma    pt denies having asthma  . Chronic osteoarthritis    knees  . Deviated septum   . Diabetes mellitus without complication ([place])   . Hyperlipidemia   . Hypertension   . Irregular menstrual cycle   . Lymphadenopathy   . Obesity   . Peri-menopause   . Pneumonia 2015    Assessment/Plan:   4 Days Post-Op Procedure(s) (LRB): LEFT FEMORAL COMPONENT REVISION (Left) APPLICATION OF CELL SAVER (N/A) Active Problems:   S/P  revision of total hip  Estimated body mass index is 39.94 kg/m as calculated from the following:   Height as of this encounter: 5\' 5"  (1.651 m).   Weight as of this encounter: 108.9 kg. Advance diet Up with therapy, toe-touch weightbearing left lower extremity x4 weeks Pain well controlled Tachycardia -resolved  Acute post op blood loss anemia  - Hgb 9.0, Continue with iron supplement.  Care management to assist with discharge to home with HHPT today DVT Prophylaxis - Lovenox, TED hose and SCDs   T. Rachelle Hora, PA-C [place] 05/07/2020, 7:26 AM

## 2020-05-07 NOTE — Discharge Instructions (Signed)
ANTERIOR APPROACH TOTAL HIP REVISION POSTOPERATIVE DIRECTIONS   Hip Rehabilitation, Guidelines Following Surgery  The results of a hip operation are greatly improved after range of motion and muscle strengthening exercises. Follow all safety measures which are given to protect your hip. If any of these exercises cause increased pain or swelling in your joint, decrease the amount until you are comfortable again. Then slowly increase the exercises. Call your caregiver if you have problems or questions.   HOME CARE INSTRUCTIONS  Remove items at home which could result in a fall. This includes throw rugs or furniture in walking pathways.   ICE to the affected hip every three hours for 30 minutes at a time and then as needed for pain and swelling.  Continue to use ice on the hip for pain and swelling from surgery. You may notice swelling that will progress down to the foot and ankle.  This is normal after surgery.  Elevate the leg when you are not up walking on it.    Continue to use the breathing machine which will help keep your temperature down.  It is common for your temperature to cycle up and down following surgery, especially at night when you are not up moving around and exerting yourself.  The breathing machine keeps your lungs expanded and your temperature down.  Do not place pillow under knee, focus on keeping the knee straight while resting  DIET You may resume your previous home diet once your are discharged from the hospital.  DRESSING / WOUND CARE / SHOWERING Please remove provena negative pressure dressing on 05/14/2020 and apply honey comb dressing. Keep dressing clean and dry at all times.   ACTIVITY Walk with your walker as instructed. Use walker as long as suggested by your caregivers. Avoid periods of inactivity such as sitting longer than an hour when not asleep. This helps prevent blood clots.  You may resume a sexual relationship in one month or when given the OK by your  doctor.  You may return to work once you are cleared by your doctor.  Do not drive a car for 6 weeks or until released by you surgeon.  Do not drive while taking narcotics.  WEIGHT BEARING Weight bearing as tolerated. Use walker/cane as needed for at least 4 weeks post op.  POSTOPERATIVE CONSTIPATION PROTOCOL Constipation - defined medically as fewer than three stools per week and severe constipation as less than one stool per week.  One of the most common issues patients have following surgery is constipation.  Even if you have a regular bowel pattern at home, your normal regimen is likely to be disrupted due to multiple reasons following surgery.  Combination of anesthesia, postoperative narcotics, change in appetite and fluid intake all can affect your bowels.  In order to avoid complications following surgery, here are some recommendations in order to help you during your recovery period.  Colace (docusate) - Pick up an over-the-counter form of Colace or another stool softener and take twice a day as long as you are requiring postoperative pain medications.  Take with a full glass of water daily.  If you experience loose stools or diarrhea, hold the colace until you stool forms back up.  If your symptoms do not get better within 1 week or if they get worse, check with your doctor.  Dulcolax (bisacodyl) - Pick up over-the-counter and take as directed by the product packaging as needed to assist with the movement of your bowels.  Take with a  full glass of water.  Use this product as needed if not relieved by Colace only.   MiraLax (polyethylene glycol) - Pick up over-the-counter to have on hand.  MiraLax is a solution that will increase the amount of water in your bowels to assist with bowel movements.  Take as directed and can mix with a glass of water, juice, soda, coffee, or tea.  Take if you go more than two days without a movement. Do not use MiraLax more than once per day. Call your doctor  if you are still constipated or irregular after using this medication for 7 days in a row.  If you continue to have problems with postoperative constipation, please contact the office for further assistance and recommendations.  If you experience "the worst abdominal pain ever" or develop nausea or vomiting, please contact the office immediatly for further recommendations for treatment.  ITCHING  If you experience itching with your medications, try taking only a single pain pill, or even half a pain pill at a time.  You can also use Benadryl over the counter for itching or also to help with sleep.   TED HOSE STOCKINGS Wear the elastic stockings on both legs for six weeks following surgery during the day but you may remove then at night for sleeping.  MEDICATIONS See your medication summary on the "After Visit Summary" that the nursing staff will review with you prior to discharge.  You may have some home medications which will be placed on hold until you complete the course of blood thinner medication.  It is important for you to complete the blood thinner medication as prescribed by your surgeon.  Continue your approved medications as instructed at time of discharge.  PRECAUTIONS If you experience chest pain or shortness of breath - call 911 immediately for transfer to the hospital emergency department.  If you develop a fever greater that 101 F, purulent drainage from wound, increased redness or drainage from wound, foul odor from the wound/dressing, or calf pain - CONTACT YOUR SURGEON.                                                   FOLLOW-UP APPOINTMENTS Make sure you keep all of your appointments after your operation with your surgeon and caregivers. You should call the office at the above phone number and make an appointment for approximately two weeks after the date of your surgery or on the date instructed by your surgeon outlined in the "After Visit Summary".  RANGE OF MOTION AND  STRENGTHENING EXERCISES  These exercises are designed to help you keep full movement of your hip joint. Follow your caregiver's or physical therapist's instructions. Perform all exercises about fifteen times, three times per day or as directed. Exercise both hips, even if you have had only one joint replacement. These exercises can be done on a training (exercise) mat, on the floor, on a table or on a bed. Use whatever works the best and is most comfortable for you. Use music or television while you are exercising so that the exercises are a pleasant break in your day. This will make your life better with the exercises acting as a break in routine you can look forward to.  Lying on your back, slowly slide your foot toward your buttocks, raising your knee up off the floor. Then  slowly slide your foot back down until your leg is straight again.  Lying on your back spread your legs as far apart as you can without causing discomfort.  Lying on your side, raise your upper leg and foot straight up from the floor as far as is comfortable. Slowly lower the leg and repeat.  Lying on your back, tighten up the muscle in the front of your thigh (quadriceps muscles). You can do this by keeping your leg straight and trying to raise your heel off the floor. This helps strengthen the largest muscle supporting your knee.  Lying on your back, tighten up the muscles of your buttocks both with the legs straight and with the knee bent at a comfortable angle while keeping your heel on the floor.   IF YOU ARE TRANSFERRED TO A SKILLED REHAB FACILITY If the patient is transferred to a skilled rehab facility following release from the hospital, a list of the current medications will be sent to the facility for the patient to continue.  When discharged from the skilled rehab facility, please have the facility set up the patient's [place] prior to being released. Also, the skilled facility will be responsible for  providing the patient with their medications at time of release from the facility to include their pain medication, the muscle relaxants, and their blood thinner medication. If the patient is still at the rehab facility at time of the two week follow up appointment, the skilled rehab facility will also need to assist the patient in arranging follow up appointment in our office and any transportation needs.  MAKE SURE YOU:  Understand these instructions.  Get help right away if you are not doing well or get worse.    Pick up stool softner and laxative for home use following surgery while on pain medications. Continue to use ice for pain and swelling after surgery. Do not use any lotions or creams on the incision until instructed by your surgeon.

## 2020-05-07 NOTE — Progress Notes (Signed)
OT Cancellation Note  Patient Details Name: Claire Hansen MRN: [id number] DOB: 1960/03/01   Cancelled Treatment:    Reason Eval/Treat Not Completed: Patient at procedure or test/ unavailable  Pt with case mgt and EMS at this time potentially prepping to discharge. Will hold OT services at this time and f/u at later date/time as able/appropriate/pt available. Thank you.  Gerrianne Scale, [place], OTR/L ascom [phone] 05/07/20, 2:48 PM

## 2020-05-07 NOTE — Progress Notes (Signed)
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    PT Comments    Pt was sitting in recliner with supportive spouse at bedside upon arriving. She agrees to PT session." I'm doing much better. Reviewed importance of maintaining  precautions and proper wt bearing restrictions. She states understanding however struggles to adhere to wt bearing restrictions. Lengthy discussed/recommendation for SNF at DC. She has 3 FOS to enter/exit home.  Pt is considering rehab as option but prefers to DC to home. Acute PT will continue to follow and progress as able per POC. Will return later this date per current POC.    Follow Up Recommendations  SNF     Equipment Recommendations  None recommended by PT       Precautions / Restrictions Precautions Precautions: Fall;Anterior Hip Precaution Booklet Issued: Yes (comment) Restrictions Weight Bearing Restrictions: Yes LLE Weight Bearing: Touchdown weight bearing    Mobility  Bed Mobility  General bed mobility comments: Pt was in recliner pre/post session    Transfers Overall transfer level: Needs assistance Equipment used: Rolling walker (2 wheeled) Transfers: Sit to/from Stand Sit to Stand: Min guard         General transfer comment: CGA + moderate Vcs for improved technique and to adhere to precautions/WB restrictions  Ambulation/Gait Ambulation/Gait assistance: Min guard;Min assist Gait Distance (Feet): 15 Feet Assistive device: Rolling walker (2 wheeled) Gait Pattern/deviations: Step-to pattern Gait velocity: decreased   General Gait Details: Pt struggles with  maintaining proper wt bearing. Did perform 2 bouts of 15 ft with improved gait safety while adhering to restrictions on 2nd trial     Balance Overall balance assessment: Needs assistance Sitting-balance support: No upper extremity supported Sitting balance-Leahy Scale: Normal     Standing balance support: Bilateral upper extremity supported;During functional activity Standing balance-Leahy Scale: Fair Standing balance comment: reliant on RW for standing balance     Cognition Arousal/Alertness: Awake/alert Behavior During Therapy: WFL for tasks assessed/performed Overall Cognitive Status: Within Functional Limits for tasks assessed      General Comments: pt is A and oriented however has poor insight of deficits and safety awareness         General Comments General comments (skin integrity, edema, etc.): pt performed standing and seated ther ex. Issued ther ex to promote return in function of L ankle dorsiflexion. Recommend continued use of AFO during all standing activity      Pertinent Vitals/Pain Pain Assessment: No/denies pain           PT Goals (current goals can now be found in the care plan section) Acute Rehab PT Goals Patient Stated Goal: go home Progress towards PT goals: Progressing toward goals    Frequency    BID      PT Plan Current plan remains appropriate       AM-PAC PT "6 Clicks" Mobility   Outcome Measure  Help needed turning from your back to your side while in a flat bed without using bedrails?: A Little Help needed moving from lying on your back to sitting on the side of a flat bed without using bedrails?: A Little Help needed moving to  and from a bed to a chair (including a wheelchair)?: A Little Help needed standing up from a chair using your arms (e.g., wheelchair or bedside chair)?: A Little Help needed to walk in hospital room?: A Lot Help needed climbing 3-5 steps with a railing? : Total 6 Click Score: 15    End of Session  Equipment Utilized During Treatment: Gait belt Activity Tolerance: Patient tolerated treatment well Patient left: in chair;with call bell/phone within reach;with chair alarm set;with family/visitor present Nurse Communication: Mobility status;Weight bearing status;Other (comment) PT Visit Diagnosis: Other abnormalities of gait and mobility (R26.89);Muscle weakness (generalized) (M62.81)     Time: 8403-9795 PT Time Calculation (min) (ACUTE ONLY): 26 min  Charges:  $Gait Training: 8-22 mins $Therapeutic Exercise: 8-22 mins                     Julaine Fusi PTA 05/07/20, 11:54 AM

## 2020-05-07 NOTE — TOC Progression Note (Signed)
Transition of Care [facility]) - Progression Note    Patient Details  Name: Claire Hansen MRN: [id number] Date of Birth: 1960/06/18  Transition of Care [facility]) CM/SW [place], RN Phone Number: 05/07/2020, 9:55 AM  Clinical Narrative:   Had a very lengthy and detailed discussion with the patient and her spouse in the room about the danger of going home and the need to go to rehab, She has multiple steps to get into her apartment and she is not to the point of being able to safely go up and down that amount of steps, She stated that she had wanted to go home, I explained due to her insurance the ability to get [facility] is very slim and she could not be guaranteed that it would be set up, the other option would be go to Outpatient PT, however the steps would pose a hazard for her, I reviewed the bed choices with them and Called [facility] to find out what the copay will be, as requested by the patient.  Once I obtain that information will review with the patient to get the bed acceptance.      Expected Discharge Plan: [place] Barriers to Discharge: Continued Medical Work up  Expected Discharge Plan and Services Expected Discharge Plan: [place]   Discharge Planning Services: CM Consult   Living arrangements for the past 2 months: Apartment Expected Discharge Date: 05/07/20               DME Arranged: N/A         HH Arranged: NA           Social Determinants of Health (SDOH) Interventions    Readmission Risk Interventions No flowsheet data found.

## 2020-05-07 NOTE — Discharge Summary (Signed)
Physician Discharge Summary  Patient ID: Claire Hansen MRN: [id number] DOB/AGE: 1960-06-22 60 y.o.  Admit date: 05/03/2020 Discharge date: 05/07/2020  Admission Diagnoses:  S/P revision of total hip [Z96.649]   Discharge Diagnoses: Patient Active Problem List   Diagnosis Date Noted  . 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  . Leg pain, anterior, left 03/22/2020  . Primary localized osteoarthritis of left hip 07/01/2017  . Primary localized osteoarthritis of right hip 11/05/2016  . Hyperlipidemia 08/13/2014  . History of asthma 08/13/2014  . Resistant hypertension 08/13/2014  . Microalbuminuria 08/13/2014  . Morbid obesity with BMI of 45.0-49.9, adult ([place]) 08/13/2014  . Osteoarthritis of right knee 08/13/2014  . Allergic rhinitis, seasonal 08/13/2014  . Diabetes mellitus with renal manifestation ([place]) 11/06/2010    Past Medical History:  Diagnosis Date  . Allergy   . Anxiety   . Asthma    pt denies having asthma  . Chronic osteoarthritis    knees  . Deviated septum   . Diabetes mellitus without complication ([place])   . Hyperlipidemia   . Hypertension   . Irregular menstrual cycle   . Lymphadenopathy   . Obesity   . Peri-menopause   . Pneumonia 2015     Transfusion: none  Consultants (if any):   Discharged Condition: Improved  Hospital Course: Claire Hansen is an 60 y.o. female who was admitted 05/03/2020 with a diagnosis of loosening of left hip femoral component and went to the operating room on 05/03/2020 and underwent the above named procedures.    Surgeries: Procedure(s): LEFT FEMORAL COMPONENT REVISION APPLICATION OF CELL SAVER on 05/03/2020 Patient tolerated the surgery well. Taken to PACU where she was stabilized and then transferred to the orthopedic floor.  Started on Lovenox 40mg  q 24 hrs. Foot pumps applied bilaterally at 80 mm. Heels elevated on bed with rolled towels. No evidence of DVT. Negative Homan. Physical therapy started on day #1 for gait  training and transfer. OT started day #1 for ADL and assisted devices.  Patient's foley was d/c on day #1. Patient's IV  was d/c on day #2.  On post op day #4 patient was stable and ready for discharge to home.  Implants: Medacta SMS 5 stem standard, 52 mm Mpact DM liner with ceramic S 28 mm head  She was given perioperative antibiotics:  Anti-infectives (From admission, onward)   Start     Dose/Rate Route Frequency Ordered Stop   05/03/20 1400  ceFAZolin (ANCEF) IVPB 2g/100 mL premix        2 g 200 mL/hr over 30 Minutes Intravenous Every 6 hours 05/03/20 1225 05/04/20 0321   05/03/20 0615  ceFAZolin (ANCEF) 3 g in dextrose 5 % 100 mL IVPB        3 g 260 mL/hr over 30 Minutes Intravenous On call to O.R. 05/03/20 3716 05/03/20 0759   05/03/20 0600  ceFAZolin (ANCEF) 3 g in dextrose 5 % 50 mL IVPB  Status:  Discontinued        3 g 100 mL/hr over 30 Minutes Intravenous On call to O.R. 05/03/20 9678 05/03/20 0556    .  She was given sequential compression devices, early ambulation, and Lovenox TEDs for DVT prophylaxis.  She benefited maximally from the hospital stay and there were no complications.    Recent vital signs:  Vitals:   05/07/20 0523 05/07/20 0800  BP: (!) 162/86 (!) 160/84  Pulse: 90 86  Resp: 17 14  Temp: 98.3 F (36.8 C) 98.1 F (  36.7 C)  SpO2: 100% 100%    Recent laboratory studies:  Lab Results  Component Value Date   HGB 9.0 (L) 05/06/2020   HGB 8.5 (L) 05/05/2020   HGB 10.2 (L) 05/04/2020   Lab Results  Component Value Date   WBC 10.3 05/06/2020   PLT 290 05/06/2020   Lab Results  Component Value Date   INR 0.98 06/23/2017   Lab Results  Component Value Date   NA 135 05/04/2020   K 4.0 05/04/2020   CL 101 05/04/2020   CO2 25 05/04/2020   BUN 24 (H) 05/04/2020   CREATININE 1.34 (H) 05/04/2020   GLUCOSE 235 (H) 05/04/2020    Discharge Medications:   Allergies as of 05/07/2020      Reactions   Shellfish Allergy Anaphylaxis, Nausea And  Vomiting   Topical betadine is OK to use.      Medication List    STOP taking these medications   meloxicam 15 MG tablet Commonly known as: MOBIC     TAKE these medications   acetaminophen 500 MG tablet Commonly known as: TYLENOL Take 1,000 mg by mouth every 8 (eight) hours as needed for mild pain or headache.   docusate sodium 100 MG capsule Commonly known as: COLACE Take 1 capsule (100 mg total) by mouth 2 (two) times daily.   enoxaparin 40 MG/0.4ML injection Commonly known as: LOVENOX Inject 0.4 mLs (40 mg total) into the skin daily for 14 days.   ferrous [id number] C-folic acid capsule Commonly known as: TRINSICON / FOLTRIN Take 1 capsule by mouth 2 (two) times daily.   gabapentin 600 MG tablet Commonly known as: NEURONTIN Take 0.5 tablets (300 mg total) by mouth 3 (three) times daily.   hydrALAZINE 10 MG tablet Commonly known as: APRESOLINE Take 10 mg by mouth daily.   hydrocortisone cream 0.5 % Apply 1 application topically daily as needed for itching ([place]).   metFORMIN 500 MG 24 hr tablet Commonly known as: GLUCOPHAGE-XR TAKE 1 TABLET BY MOUTH  DAILY WITH BREAKFAST What changed: when to take this   methocarbamol 500 MG tablet Commonly known as: ROBAXIN Take 1 tablet (500 mg total) by mouth every 6 (six) hours as needed for muscle spasms.   metoprolol succinate 25 MG 24 hr tablet Commonly known as: TOPROL-XL Take 25 mg by mouth daily.   montelukast 10 MG tablet Commonly known as: SINGULAIR Take 1 tablet (10 mg total) by mouth daily. What changed:   when to take this  reasons to take this   Olmesartan-amLODIPine-HCTZ 40-10-25 MG Tabs Take 1 tablet by mouth daily.   oxyCODONE 5 MG immediate release tablet Commonly known as: Oxy IR/ROXICODONE Take 1-2 tablets (5-10 mg total) by mouth every 4 (four) hours as needed for moderate pain (pain score 4-6).   Rybelsus 7 MG Tabs Generic drug: Semaglutide Take 7 mg by mouth daily.    spironolactone 25 MG tablet Commonly known as: ALDACTONE Take 25 mg by mouth daily.   traMADol 50 MG tablet Commonly known as: ULTRAM Take 1 tablet (50 mg total) by mouth every 6 (six) hours as needed. What changed: Another medication with the same name was removed. Continue taking this medication, and follow the directions you see here.   Vitamin D (Ergocalciferol) 1.25 MG (50000 UNIT) Caps capsule Commonly known as: DRISDOL Take 50,000 Units by mouth every Monday.       Diagnostic Studies: DG HIP OPERATIVE UNILAT W OR W/O PELVIS LEFT  Result Date: 05/03/2020 CLINICAL DATA:  Status post revision of left hip arthroplasty. EXAM: OPERATIVE left HIP (WITH PELVIS IF PERFORMED) 1 VIEWS TECHNIQUE: Fluoroscopic spot image(s) were submitted for interpretation post-operatively. Radiation exposure index: 9.0 mGy. COMPARISON:  March 22, 2020. FINDINGS: Single intraoperative fluoroscopic image was obtained of the left hip. The left femoral and acetabular components appear to be well situated. IMPRESSION: Fluoroscopic guidance provided during left hip arthroplasty. Electronically Signed   By: Marijo Conception M.D.   On: 05/03/2020 12:14   DG HIP UNILAT W OR W/O PELVIS 2-3 VIEWS LEFT  Result Date: 05/03/2020 CLINICAL DATA:  Status post total hip replacement revision EXAM: DG HIP   2-3V LEFT COMPARISON:  Intraoperative left hip radiographs May 03, 2020; left femur radiographs March 22, 2020 FINDINGS: Frontal and lateral views obtained. There is a total hip replacement on the left with prosthetic components well-seated. No fracture or dislocation. Skin staples noted laterally. No erosion. IMPRESSION: Left total hip replacement with prosthetic components well-seated. No fracture or dislocation. Acute postoperative changes noted. Electronically Signed   By: Lowella Grip III M.D.   On: 05/03/2020 11:51    Disposition:  There are no questions and answers to display.           Follow-up  Information    Duanne Guess, PA-C Follow up.   Specialties: Orthopedic Surgery, Emergency Medicine Contact information: [place] [address] [phone]                Signed: Feliberto Gottron 05/07/2020, 9:15 AM

## 2020-07-26 HISTORY — PX: BACK SURGERY: SHX140

## 2020-07-30 ENCOUNTER — Other Ambulatory Visit: Payer: Self-pay

## 2020-07-30 ENCOUNTER — Other Ambulatory Visit: Payer: Self-pay | Admitting: Neurosurgery

## 2020-07-30 ENCOUNTER — Ambulatory Visit
Admission: RE | Admit: 2020-07-30 | Discharge: 2020-07-30 | Disposition: A | Discharge status: Home / Self Care | Payer: Managed Care, Other (non HMO) | Source: Ambulatory Visit | Attending: Neurosurgery | Admitting: Neurosurgery

## 2020-07-30 ENCOUNTER — Other Ambulatory Visit (HOSPITAL_COMMUNITY): Payer: Self-pay | Admitting: Neurosurgery

## 2020-07-30 DIAGNOSIS — R29898 Other symptoms and signs involving the musculoskeletal system: Secondary | ICD-10-CM

## 2020-08-03 DIAGNOSIS — M48061 Spinal stenosis, lumbar region without neurogenic claudication: Secondary | ICD-10-CM | POA: Insufficient documentation

## 2020-08-28 ENCOUNTER — Ambulatory Visit: Payer: Managed Care, Other (non HMO) | Admitting: Physical Therapy

## 2020-09-01 NOTE — Addendum Note (Signed)
Encounter addended by: Annie Paras on: 09/01/2020 12:14 PM  Actions taken: Letter saved

## 2020-09-03 ENCOUNTER — Other Ambulatory Visit: Payer: Self-pay | Admitting: Family

## 2020-09-03 DIAGNOSIS — M21371 Foot drop, right foot: Secondary | ICD-10-CM

## 2020-09-03 DIAGNOSIS — G9520 Unspecified cord compression: Secondary | ICD-10-CM

## 2020-11-26 ENCOUNTER — Other Ambulatory Visit: Payer: Self-pay | Admitting: Obstetrics and Gynecology

## 2020-12-12 NOTE — H&P (Signed)
Claire Hansen is a 60 y.o. female here for TAH and BSO  .pt here for consultation form Dr Lacinda Axon ( [place] from [facility] ) for findings of a large fibroid utx and complex left ovarian cyst .  Pt c/o pelvic pressure and pain and heaviness. + dyspareuniafor the past 1 yr . Menopausal and no PMB .  She has undergone surgery for lumbar spinal fusion 2 months ago .    Pelvic u/s 08/06/20: Uterus:  Enlarged.  Size:   16.2  x  15.7  x  9.7  cm   Myometrium: Hypoechoic masses are seen, most likely reflecting uterine fibroids. The largest fibroid arising from the mid uterus measures 10.2 cm x 9.7 cm x 8.3 cm. There is a smaller, pedunculated fibroid measuring 4.2 cm x 4.4 cm x 2.3 cm.  Endometrium:  Obscured due to fibroids.    Right Ovary:  Size:   4.8  x  4.3  x  2.8  cm  Masses: Hypoechoic lesion measuring 2.2 cm x 2.5 cm x 2.3 cm. Ovarian blood flow:  Visualized Extra ovarian findings: None   Left Ovary: Tentatively identified in the region of the left adnexa. Size:   8.0  x  5.6  x  3.4  cm Masses: Complex mass with internal septations and internal echogenic material measuring 5.0 cm x 5.4 cm x 3.2 cm. Ovarian blood flow:  Visualized Extra ovarian findings: None   Pelvic Free Fluid:  None     EMBX : neg    Past Medical History:  has a past medical history of Allergic state, Arthritis, Diabetes mellitus type 2, uncomplicated (CMS-HCC), Diabetes mellitus with renal manifestation (CMS-HCC) (11/06/2010), Hyperlipidemia (08/13/2014), and Hypertension.  Past Surgical History:  has a past surgical history that includes Cholecystectomy; Cesarean section; Total hip arthroplasty anterior approach (Right, 11/05/2016); Joint replacement (Left, 05/02/2020); Tubal ligation; Tonsillectomy; Total hip arthroplasty anterior approach (Left, 07/01/2017); Colonoscopy; decompression  peroneal nerve (04/29/2020); Abdominal surgery; and transforaminal lumbar interbody fusion minimally invasive (N/A, 08/04/2020). Family  History: family history includes No Known Problems in her mother. Social History:  reports that she has never smoked. She has never used smokeless tobacco. She reports that she does not drink alcohol and does not use drugs. OB/GYN History:          OB History     Gravida  2   Para  2   Term      Preterm      AB      Living  2      SAB      IAB      Ectopic      Molar      Multiple      Live Births  2             Allergies: is allergic to shellfish containing products. Medications:   Current Outpatient Medications:    gabapentin (NEURONTIN) 300 MG capsule, 1 tab at breakfast and mid-day. 2 tabs at bedtime, Disp: 120 capsule, Rfl: 1   insulin GLARGINE (LANTUS SOLOSTAR U-100 INSULIN) pen injector (concentration 100 units/mL), Inject 24 Units subcutaneously nightly, Disp: 9 mL, Rfl: 1   insulin LISPRO (HUMALOG KWIKPEN) pen injector (concentration 100 units/mL), 6 units with breakfast, 5 units with lunch, 6 units with dinner, Disp: 9 mL, Rfl: 1   losartan (COZAAR) 100 MG tablet, Take 1 tablet (100 mg total) by mouth once daily, Disp: 30 tablet, Rfl: 1   methocarbamoL (ROBAXIN) 750 MG tablet, Take  1 tablet (750 mg total) by mouth 4 (four) times daily as needed (muscle spasm), Disp: 120 tablet, Rfl: 1   multivitamin 400 mcg tablet, Take 1 tablet by mouth once daily, Disp: 30 tablet, Rfl: 0   ONETOUCH VERIO TEST STRIPS test strip, , Disp: , Rfl:    pen needle, diabetic (BD ULTRA-FINE MINI PEN NEEDLE) 31 gauge x 3/16" needle, Use as directed, Disp: 100 each, Rfl: 1   spironolactone (ALDACTONE) 25 MG tablet, Take 1 tablet (25 mg total) by mouth once daily, Disp: 30 tablet, Rfl: 1   traMADoL (ULTRAM) 50 mg tablet, Take 1 tablet (50 mg total) by mouth every 8 (eight) hours as needed for Pain, Disp: 50 tablet, Rfl: 1   carvediloL (COREG) 12.5 MG tablet, Take 0.5 tablets (6.25 mg total) by mouth 2 (two) times daily with meals for 30 days, Disp: 60 tablet, Rfl: 1   cyanocobalamin  (VITAMIN B12) 1000 MCG tablet, Take 1 tablet (1,000 mcg total) by mouth once daily for 30 days, Disp: 30 tablet, Rfl: 0   DULoxetine (CYMBALTA) 60 MG DR capsule, Take 1 capsule (60 mg total) by mouth nightly for 30 days, Disp: 30 capsule, Rfl: 1   hydrALAZINE (APRESOLINE) 50 MG tablet, Take 1 tablet (50 mg total) by mouth 3 (three) times daily for 30 days, Disp: 90 tablet, Rfl: 1   melatonin 3 mg tablet, Take 1 tablet (3 mg total) by mouth nightly (Patient not taking: Reported on 11/14/2020), Disp: , Rfl: 0   metFORMIN (GLUCOPHAGE-XR) 500 MG XR tablet, Take 2 tablets (1,000 mg total) by mouth 2 (two) times daily with meals for 30 days, Disp: 120 tablet, Rfl: 1   montelukast (SINGULAIR) 10 mg tablet, Take 1 tablet (10 mg total) by mouth once daily (Patient not taking: Reported on 11/14/2020), Disp: 30 tablet, Rfl: 1   rosuvastatin (CRESTOR) 10 MG tablet, Take 1 tablet (10 mg total) by mouth every evening (Patient not taking: Reported on 11/14/2020), Disp: 30 tablet, Rfl: 1   sennosides-docusate (SENOKOT-S) 8.6-50 mg tablet, Take 2 tablets by mouth 2 (two) times daily (Patient not taking: Reported on 11/14/2020), Disp: , Rfl: 0   TORsemide (DEMADEX) 20 MG tablet, Take 1 tablet (20 mg total) by mouth once daily (Patient not taking: Reported on 11/14/2020), Disp: 30 tablet, Rfl: 1   Review of Systems: General:                      No fatigue or weight loss Eyes:                           No vision changes Ears:                            No hearing difficulty Respiratory:                No cough or shortness of breath Pulmonary:                  No asthma or shortness of breath Cardiovascular:           No chest pain, palpitations, dyspnea on exertion Gastrointestinal:          No abdominal bloating, chronic diarrhea, constipations, masses, pain or hematochezia Genitourinary:             No hematuria, dysuria, abnormal vaginal discharge, +pelvic pain, Menometrorrhagia Lymphatic:  No  swollen lymph nodes Musculoskeletal:         No muscle weakness Neurologic:                  No extremity weakness, syncope, seizure disorder Psychiatric:                  No history of depression, delusions or suicidal/homicidal ideation      Exam:       Vitals:    111/21/22 0959  BP: (!) 150/100  Pulse: 97      Body mass index is 38.27 kg/m.   WDWN  black female in NAD   Lungs: CTA  CV : RRR without murmur   Breast: exam done in sitting and lying position : No dimpling or retraction, no dominant mass, no spontaneous discharge, no axillary adenopathy Neck:  no thyromegaly Abdomen: soft , no mass, normal active bowel sounds,  non-tender, no rebound tenderness Pelvic: tanner stage 5 ,  External genitalia: vulva /labia no lesions Urethra: no prolapse Vagina: normal physiologic d/cwet mount : neg  Cervix: no lesions, no cervical motion tenderness   Uterus:17 week fills pelvis , prominent right side   Adnexa: no mass,  non-tender   Rectovaginal: no mass heme negative Endometrial biopsy: The cervix was cleaned with betadine and a single tooth tenaculum is applied to the anterior cervix. The Pipelle catheter was placed into the endometrial cavity. It sounds to 5 cm and adequate tissue was removed. Not sure if I gained entrance into the endometrial canal after multiple dilations  Impression:    The primary encounter diagnosis was Intramural and submucous leiomyoma of uterus. Diagnoses of Ovarian cyst, left, Leukorrhea, and Essential hypertension were also pertinent to this visit.   Symptomatic fibroid . Most of her pain is on the right . Some her sciatica may be related to her uterus  Left ovary may be c/w an endometrioma  Plan:  Spoke to her about surgical intervention  ie TAH /BSO She is in  Agreement to try to alleviate her pain . Benefits and risks to surgery: The proposed benefit of the surgery has been discussed with the patient. The possible risks include, but are not  limited to: organ injury to the bowel , bladder, ureters, and major blood vessels and nerves. There is a possibility of additional surgeries resulting from these injuries. There is also the risk of blood transfusion and the need to receive blood products during or after the procedure which may rarely lead to HIV or Hepatitis C infection. There is a risk of developing a deep venous thrombosis or a pulmonary embolism . There is the possibility of wound infection and also anesthetic complications, even the rare possibility of death. The patient understands these risks and wishes to proceed. All questions have been answered and the consent has been signed.  needs preop clearance for her DM  And HTN  60 minutes in pat care     No follow-ups on file.   Caroline Sauger, MD

## 2020-12-26 ENCOUNTER — Other Ambulatory Visit
Admission: RE | Admit: 2020-12-26 | Discharge: 2020-12-26 | Disposition: A | Discharge status: Home / Self Care | Payer: Managed Care, Other (non HMO) | Source: Ambulatory Visit | Attending: Obstetrics and Gynecology | Admitting: Obstetrics and Gynecology

## 2020-12-26 NOTE — Patient Instructions (Addendum)
Your procedure is scheduled on: Friday January 10, 2021. Report to Day Surgery inside [facility] 2nd floor. To find out your arrival time please call [phone] between 1PM - 3PM on Thursday January 09, 2021.  Remember: Instructions that are not followed completely may result in serious medical risk,  up to and including death, or upon the discretion of your surgeon and anesthesiologist your  surgery may need to be rescheduled.     _X__ 1. Do not eat food after midnight the night before your procedure.                 No chewing gum or hard candies. You may drink clear liquids up to 2 hours                 before you are scheduled to arrive for your surgery- DO not drink clear                 liquids within 2 hours of the start of your surgery.                 Clear Liquids include:  water, G2 or                  Gatorade Zero (avoid Red/Purple/Blue), Black Coffee or Tea (Do not add                 anything to coffee or tea).  __X__2.  On the morning of surgery brush your teeth with toothpaste and water, you                may rinse your mouth with mouthwash if you wish.  Do not swallow any toothpaste or mouthwash.     _X__ 3.  No Alcohol for 24 hours before or after surgery.   _X__ 4.  Do Not Smoke or use e-cigarettes For 24 Hours Prior to Your Surgery.                 Do not use any chewable tobacco products for at least 6 hours prior to                 Surgery.  _X__  5.  Do not use any recreational drugs (marijuana, cocaine, heroin, ecstasy, MDMA or other)                For at least one week prior to your surgery.  Combination of these drugs with anesthesia                May have life threatening results.  __X__  6.  Notify your doctor if there is any change in your medical condition      (cold, fever, infections).     Do not wear jewelry, make-up, hairpins, clips or nail polish. Do not wear lotions, powders, or perfumes. You may wear  deodorant. Do not shave 48 hours prior to surgery.  Do not bring valuables to the hospital.    [facility] is not responsible for any belongings or valuables.  Contacts, dentures or bridgework may not be worn into surgery. Leave your suitcase in the car. After surgery it may be brought to your room. For patients admitted to the hospital, discharge time is determined by your treatment team.   Patients discharged the day of surgery will not be allowed to drive home.   Make arrangements for someone to be with you for the first 24 hours of your Same  Day Discharge.   __X__ Take these medicines the morning of surgery with A SIP OF WATER:    1. famotidine (PEPCID) 20 MG  2. gabapentin (NEURONTIN) 600 MG   3. hydrALAZINE (APRESOLINE) 10 MG  4. metoprolol succinate (TOPROL-XL) 25 MG  5. spironolactone (ALDACTONE) 25 MG  6.  ____ Fleet Enema (as directed)   __X__ Use CHG Soap (or wipes) as directed  ____ Use Benzoyl Peroxide Gel as instructed  ____ Use inhalers on the day of surgery  __X__ Stop metFORMIN (GLUCOPHAGE-XR) 500 MG 24 hr tablet 2 days prior to surgery (take last dose 01/07/21)    __X__ Take 1/2 of usual insulin dose the night before surgery. No insulin the morning          of surgery.   ____ Call your PCP, cardiologist, or Pulmonologist if taking Coumadin/Plavix/aspirin and ask when to stop before your surgery.   __X__ One Week prior to surgery- Stop Anti-inflammatories such as Ibuprofen, Aleve, Advil, Motrin, meloxicam (MOBIC), diclofenac, etodolac, ketorolac, Toradol, Daypro, piroxicam, Goody's or BC powders. OK TO USE TYLENOL IF NEEDED   __X__ One week prior to surgery stop ALL supplements.    ____ Bring C-Pap to the hospital.    If you have any questions regarding your pre-procedure instructions,  Please call Pre-admit Testing at [phone].

## 2020-12-30 ENCOUNTER — Other Ambulatory Visit
Admission: RE | Admit: 2020-12-30 | Discharge: 2020-12-30 | Disposition: A | Discharge status: Home / Self Care | Payer: Managed Care, Other (non HMO) | Source: Ambulatory Visit | Attending: Obstetrics and Gynecology | Admitting: Obstetrics and Gynecology

## 2020-12-30 ENCOUNTER — Other Ambulatory Visit: Payer: Self-pay

## 2020-12-30 DIAGNOSIS — Z01812 Encounter for preprocedural laboratory examination: Secondary | ICD-10-CM | POA: Insufficient documentation

## 2020-12-30 DIAGNOSIS — Z01818 Encounter for other preprocedural examination: Secondary | ICD-10-CM

## 2020-12-30 LAB — TYPE AND SCREEN
ABO/RH(D): O POS
Antibody Screen: NEGATIVE

## 2020-12-30 LAB — BASIC METABOLIC PANEL
Anion gap: 8 (ref 5–15)
BUN: 23 mg/dL — ABNORMAL HIGH (ref 6–20)
CO2: 25 mmol/L (ref 22–32)
Calcium: 10.1 mg/dL (ref 8.9–10.3)
Chloride: 101 mmol/L (ref 98–111)
Creatinine, Ser: 0.84 mg/dL (ref 0.44–1.00)
GFR, Estimated: >60 mL/min (ref >60)
Glucose, Bld: 182 mg/dL — ABNORMAL HIGH (ref 70–99)
Potassium: 4 mmol/L (ref 3.5–5.1)
Sodium: 134 mmol/L — ABNORMAL LOW (ref 135–145)

## 2020-12-30 LAB — CBC
HCT: 42.3 % (ref 36.0–46.0)
Hemoglobin: 13.9 g/dL (ref 12.0–15.0)
MCH: 27.8 pg (ref 26.0–34.0)
MCHC: 32.9 g/dL (ref 30.0–36.0)
MCV: 84.6 fL (ref 80.0–100.0)
Platelets: 349 10*3/uL (ref 150–400)
RBC: 5 MIL/uL (ref 3.87–5.11)
RDW: 14.2 % (ref 11.5–15.5)
WBC: 8.2 10*3/uL (ref 4.0–10.5)
nRBC: 0 % (ref 0.0–0.2)

## 2021-01-01 ENCOUNTER — Other Ambulatory Visit: Payer: Self-pay

## 2021-01-01 ENCOUNTER — Ambulatory Visit
Admission: RE | Admit: 2021-01-01 | Discharge: 2021-01-01 | Disposition: A | Discharge status: Home / Self Care | Payer: Managed Care, Other (non HMO) | Source: Ambulatory Visit | Attending: Obstetrics and Gynecology | Admitting: Obstetrics and Gynecology

## 2021-01-01 DIAGNOSIS — D509 Iron deficiency anemia, unspecified: Secondary | ICD-10-CM | POA: Diagnosis present

## 2021-01-01 MED ORDER — SODIUM CHLORIDE 0.9 % IV SOLN
300.0000 mg | INTRAVENOUS | Status: DC
  Administered 2021-01-01: 300 mg via INTRAVENOUS
  Filled 2021-01-01: qty 300

## 2021-01-01 MED ORDER — SODIUM CHLORIDE FLUSH 0.9 % IV SOLN
INTRAVENOUS | Status: AC
  Filled 2021-01-01: qty 10

## 2021-01-06 ENCOUNTER — Other Ambulatory Visit: Payer: Self-pay

## 2021-01-06 ENCOUNTER — Ambulatory Visit
Admission: RE | Admit: 2021-01-06 | Discharge: 2021-01-06 | Disposition: A | Discharge status: Home / Self Care | Payer: Managed Care, Other (non HMO) | Source: Ambulatory Visit | Attending: Obstetrics and Gynecology | Admitting: Obstetrics and Gynecology

## 2021-01-06 DIAGNOSIS — D509 Iron deficiency anemia, unspecified: Secondary | ICD-10-CM | POA: Diagnosis present

## 2021-01-06 MED ORDER — SODIUM CHLORIDE 0.9 % IV SOLN
300.0000 mg | Freq: Once | INTRAVENOUS | Status: AC
  Administered 2021-01-06: 300 mg via INTRAVENOUS
  Filled 2021-01-06: qty 300

## 2021-01-06 MED ORDER — SODIUM CHLORIDE FLUSH 0.9 % IV SOLN
INTRAVENOUS | Status: AC
  Filled 2021-01-06: qty 10

## 2021-01-07 ENCOUNTER — Other Ambulatory Visit
Admission: RE | Admit: 2021-01-07 | Discharge: 2021-01-07 | Disposition: A | Discharge status: Home / Self Care | Payer: Managed Care, Other (non HMO) | Source: Ambulatory Visit | Attending: Obstetrics and Gynecology | Admitting: Obstetrics and Gynecology

## 2021-01-07 DIAGNOSIS — Z01812 Encounter for preprocedural laboratory examination: Secondary | ICD-10-CM | POA: Insufficient documentation

## 2021-01-07 DIAGNOSIS — Z20822 Contact with and (suspected) exposure to covid-19: Secondary | ICD-10-CM

## 2021-01-08 LAB — SARS CORONAVIRUS 2 (TAT 6-24 HRS): SARS Coronavirus 2: NEGATIVE

## 2021-01-08 NOTE — Progress Notes (Signed)
Iron deficiency anemia

## 2021-01-10 ENCOUNTER — Encounter: Payer: Self-pay | Admitting: Obstetrics and Gynecology

## 2021-01-10 ENCOUNTER — Inpatient Hospital Stay: Payer: Managed Care, Other (non HMO) | Admitting: Urgent Care

## 2021-01-10 ENCOUNTER — Encounter
Admission: RE | Disposition: A | Discharge status: Home / Self Care | Payer: Self-pay | Source: Home / Self Care | Attending: Obstetrics and Gynecology

## 2021-01-10 ENCOUNTER — Other Ambulatory Visit: Payer: Self-pay

## 2021-01-10 ENCOUNTER — Inpatient Hospital Stay: Payer: Managed Care, Other (non HMO) | Admitting: Anesthesiology

## 2021-01-10 ENCOUNTER — Inpatient Hospital Stay
Admission: RE | Admit: 2021-01-10 | Discharge: 2021-01-12 | DRG: 742 | Disposition: A | Discharge status: Home / Self Care | Payer: Managed Care, Other (non HMO) | Attending: Obstetrics and Gynecology | Admitting: Obstetrics and Gynecology

## 2021-01-10 DIAGNOSIS — I10 Essential (primary) hypertension: Secondary | ICD-10-CM | POA: Diagnosis present

## 2021-01-10 DIAGNOSIS — Z20822 Contact with and (suspected) exposure to covid-19: Secondary | ICD-10-CM | POA: Diagnosis present

## 2021-01-10 DIAGNOSIS — N898 Other specified noninflammatory disorders of vagina: Secondary | ICD-10-CM | POA: Diagnosis present

## 2021-01-10 DIAGNOSIS — D251 Intramural leiomyoma of uterus: Secondary | ICD-10-CM | POA: Diagnosis present

## 2021-01-10 DIAGNOSIS — E1129 Type 2 diabetes mellitus with other diabetic kidney complication: Secondary | ICD-10-CM | POA: Diagnosis present

## 2021-01-10 DIAGNOSIS — D250 Submucous leiomyoma of uterus: Secondary | ICD-10-CM | POA: Diagnosis present

## 2021-01-10 DIAGNOSIS — Z9889 Other specified postprocedural states: Secondary | ICD-10-CM

## 2021-01-10 DIAGNOSIS — L7632 Postprocedural hematoma of skin and subcutaneous tissue following other procedure: Secondary | ICD-10-CM | POA: Diagnosis not present

## 2021-01-10 DIAGNOSIS — M5430 Sciatica, unspecified side: Secondary | ICD-10-CM | POA: Diagnosis present

## 2021-01-10 DIAGNOSIS — Z96649 Presence of unspecified artificial hip joint: Secondary | ICD-10-CM | POA: Diagnosis present

## 2021-01-10 DIAGNOSIS — Z6838 Body mass index (BMI) 38.0-38.9, adult: Secondary | ICD-10-CM

## 2021-01-10 DIAGNOSIS — Z9049 Acquired absence of other specified parts of digestive tract: Secondary | ICD-10-CM

## 2021-01-10 DIAGNOSIS — Z01818 Encounter for other preprocedural examination: Secondary | ICD-10-CM

## 2021-01-10 DIAGNOSIS — N83292 Other ovarian cyst, left side: Secondary | ICD-10-CM | POA: Diagnosis present

## 2021-01-10 HISTORY — PX: CYSTOSCOPY: SHX5120

## 2021-01-10 HISTORY — DX: Other specified postprocedural states: Z98.890

## 2021-01-10 HISTORY — PX: SUPRACERVICAL ABDOMINAL HYSTERECTOMY: SHX5393

## 2021-01-10 LAB — CBC
HCT: 39.3 % (ref 36.0–46.0)
Hemoglobin: 13.6 g/dL (ref 12.0–15.0)
MCH: 29.2 pg (ref 26.0–34.0)
MCHC: 34.6 g/dL (ref 30.0–36.0)
MCV: 84.5 fL (ref 80.0–100.0)
Platelets: 321 10*3/uL (ref 150–400)
RBC: 4.65 MIL/uL (ref 3.87–5.11)
RDW: 14.3 % (ref 11.5–15.5)
WBC: 16.2 10*3/uL — ABNORMAL HIGH (ref 4.0–10.5)
nRBC: 0 % (ref 0.0–0.2)

## 2021-01-10 LAB — CREATININE, SERUM
Creatinine, Ser: 0.85 mg/dL (ref 0.44–1.00)
GFR, Estimated: >60 mL/min (ref >60)

## 2021-01-10 LAB — GLUCOSE, CAPILLARY
Glucose-Capillary: 178 mg/dL — ABNORMAL HIGH (ref 70–99)
Glucose-Capillary: 261 mg/dL — ABNORMAL HIGH (ref 70–99)
Glucose-Capillary: 265 mg/dL — ABNORMAL HIGH (ref 70–99)
Glucose-Capillary: 244 mg/dL — ABNORMAL HIGH (ref 70–99)
Glucose-Capillary: 195 mg/dL — ABNORMAL HIGH (ref 70–99)

## 2021-01-10 SURGERY — HYSTERECTOMY, SUPRACERVICAL, ABDOMINAL
Anesthesia: General | Laterality: Bilateral

## 2021-01-10 MED ORDER — PHENYLEPHRINE HCL-NACL 20-0.9 MG/250ML-% IV SOLN
INTRAVENOUS | Status: DC | PRN
  Administered 2021-01-10: 25 ug/min via INTRAVENOUS

## 2021-01-10 MED ORDER — INSULIN ASPART 100 UNIT/ML IJ SOLN: 0.0000 [IU] | Freq: Every day | INTRAMUSCULAR | Status: DC

## 2021-01-10 MED ORDER — PROPOFOL 10 MG/ML IV BOLUS
INTRAVENOUS | Status: DC | PRN
  Administered 2021-01-10: 200 mg via INTRAVENOUS

## 2021-01-10 MED ORDER — MORPHINE SULFATE (PF) 2 MG/ML IV SOLN
1.0000 mg | INTRAVENOUS | Status: DC | PRN
  Administered 2021-01-10 (×3): 2 mg via INTRAVENOUS
  Filled 2021-01-10 (×3): qty 1

## 2021-01-10 MED ORDER — KETOROLAC TROMETHAMINE 30 MG/ML IJ SOLN
INTRAMUSCULAR | Status: DC | PRN
  Administered 2021-01-10: 30 mg via INTRAVENOUS

## 2021-01-10 MED ORDER — ENOXAPARIN SODIUM 40 MG/0.4ML IJ SOSY
40.0000 mg | PREFILLED_SYRINGE | INTRAMUSCULAR | Status: DC
  Administered 2021-01-11: 40 mg via SUBCUTANEOUS
  Filled 2021-01-10: qty 0.4

## 2021-01-10 MED ORDER — ONDANSETRON HCL 4 MG/2ML IJ SOLN
INTRAMUSCULAR | Status: DC | PRN
  Administered 2021-01-10: 4 mg via INTRAVENOUS

## 2021-01-10 MED ORDER — HYDRALAZINE HCL 20 MG/ML IJ SOLN
INTRAMUSCULAR | Status: AC
  Filled 2021-01-10: qty 1

## 2021-01-10 MED ORDER — FLUORESCEIN SODIUM 10 % IV SOLN
INTRAVENOUS | Status: DC | PRN
  Administered 2021-01-10: .5 mL via INTRAVENOUS

## 2021-01-10 MED ORDER — CHLORHEXIDINE GLUCONATE 0.12 % MT SOLN: 15.0000 mL | Freq: Once | OROMUCOSAL | Status: AC

## 2021-01-10 MED ORDER — SODIUM CHLORIDE 0.9 % IV SOLN: INTRAVENOUS | Status: DC

## 2021-01-10 MED ORDER — SODIUM CHLORIDE FLUSH 0.9 % IV SOLN
INTRAVENOUS | Status: AC
  Filled 2021-01-10: qty 20

## 2021-01-10 MED ORDER — CEFAZOLIN SODIUM-DEXTROSE 2-4 GM/100ML-% IV SOLN
2.0000 g | Freq: Once | INTRAVENOUS | Status: AC
  Administered 2021-01-10 (×2): 2 g via INTRAVENOUS

## 2021-01-10 MED ORDER — INSULIN GLARGINE-YFGN 100 UNIT/ML ~~LOC~~ SOLN
10.0000 [IU] | Freq: Every day | SUBCUTANEOUS | Status: DC
  Administered 2021-01-10 – 2021-01-11 (×2): 10 [IU] via SUBCUTANEOUS
  Filled 2021-01-10 (×4): qty 0.1

## 2021-01-10 MED ORDER — BUPIVACAINE HCL (PF) 0.5 % IJ SOLN
INTRAMUSCULAR | Status: AC
  Filled 2021-01-10: qty 30

## 2021-01-10 MED ORDER — SODIUM CHLORIDE 0.9% FLUSH: 9.0000 mL | INTRAVENOUS | Status: DC | PRN

## 2021-01-10 MED ORDER — PROPOFOL 500 MG/50ML IV EMUL
INTRAVENOUS | Status: AC
  Filled 2021-01-10: qty 50

## 2021-01-10 MED ORDER — SODIUM CHLORIDE 0.9 % IR SOLN
Status: DC | PRN
  Administered 2021-01-10: 100 mL

## 2021-01-10 MED ORDER — LACTATED RINGERS IV SOLN: INTRAVENOUS | Status: DC

## 2021-01-10 MED ORDER — HYDROCHLOROTHIAZIDE 25 MG PO TABS
25.0000 mg | ORAL_TABLET | Freq: Every day | ORAL | Status: DC
  Administered 2021-01-10 – 2021-01-12 (×2): 25 mg via ORAL
  Filled 2021-01-10 (×3): qty 1

## 2021-01-10 MED ORDER — STERILE WATER FOR IRRIGATION IR SOLN
Status: DC | PRN
  Administered 2021-01-10: 600 mL

## 2021-01-10 MED ORDER — SUCCINYLCHOLINE CHLORIDE 200 MG/10ML IV SOSY
PREFILLED_SYRINGE | INTRAVENOUS | Status: DC | PRN
  Administered 2021-01-10: 120 mg via INTRAVENOUS

## 2021-01-10 MED ORDER — POVIDONE-IODINE 10 % EX SWAB
2.0000 "application " | Freq: Once | CUTANEOUS | Status: AC
  Administered 2021-01-10: 2 via TOPICAL

## 2021-01-10 MED ORDER — PHENYLEPHRINE HCL (PRESSORS) 10 MG/ML IV SOLN: INTRAVENOUS | Status: DC | PRN

## 2021-01-10 MED ORDER — FENTANYL CITRATE (PF) 100 MCG/2ML IJ SOLN
INTRAMUSCULAR | Status: DC | PRN
  Administered 2021-01-10: 50 ug via INTRAVENOUS
  Administered 2021-01-10: 100 ug via INTRAVENOUS
  Administered 2021-01-10: 50 ug via INTRAVENOUS

## 2021-01-10 MED ORDER — HYDRALAZINE HCL 20 MG/ML IJ SOLN
10.0000 mg | Freq: Once | INTRAMUSCULAR | Status: AC
  Administered 2021-01-10: 10 mg via INTRAVENOUS

## 2021-01-10 MED ORDER — SODIUM CHLORIDE (PF) 0.9 % IJ SOLN
INTRAMUSCULAR | Status: DC | PRN
  Administered 2021-01-10: 45 mL

## 2021-01-10 MED ORDER — MIDAZOLAM HCL 2 MG/2ML IJ SOLN
INTRAMUSCULAR | Status: DC | PRN
  Administered 2021-01-10: 2 mg via INTRAVENOUS

## 2021-01-10 MED ORDER — IBUPROFEN 600 MG PO TABS: 600.0000 mg | ORAL_TABLET | Freq: Four times a day (QID) | ORAL | Status: DC

## 2021-01-10 MED ORDER — MIDAZOLAM HCL 2 MG/2ML IJ SOLN
INTRAMUSCULAR | Status: AC
  Filled 2021-01-10: qty 2

## 2021-01-10 MED ORDER — GABAPENTIN 300 MG PO CAPS
300.0000 mg | ORAL_CAPSULE | Freq: Every day | ORAL | Status: DC
  Administered 2021-01-10 – 2021-01-11 (×2): 300 mg via ORAL
  Filled 2021-01-10 (×2): qty 1

## 2021-01-10 MED ORDER — FLUORESCEIN SODIUM 10 % IV SOLN
INTRAVENOUS | Status: AC
  Filled 2021-01-10: qty 5

## 2021-01-10 MED ORDER — ONDANSETRON HCL 4 MG/2ML IJ SOLN
4.0000 mg | Freq: Four times a day (QID) | INTRAMUSCULAR | Status: DC | PRN
  Administered 2021-01-10: 4 mg via INTRAVENOUS

## 2021-01-10 MED ORDER — ACETAMINOPHEN 500 MG PO TABS: 1000.0000 mg | ORAL_TABLET | ORAL | Status: AC

## 2021-01-10 MED ORDER — INSULIN ASPART 100 UNIT/ML IJ SOLN
0.0000 [IU] | Freq: Three times a day (TID) | INTRAMUSCULAR | Status: DC
  Administered 2021-01-10: 5 [IU] via SUBCUTANEOUS
  Administered 2021-01-10: 3 [IU] via SUBCUTANEOUS
  Administered 2021-01-11 – 2021-01-12 (×5): 2 [IU] via SUBCUTANEOUS
  Filled 2021-01-10 (×7): qty 1

## 2021-01-10 MED ORDER — CEFAZOLIN SODIUM-DEXTROSE 2-4 GM/100ML-% IV SOLN
INTRAVENOUS | Status: AC
  Filled 2021-01-10: qty 100

## 2021-01-10 MED ORDER — GLYCOPYRROLATE 0.2 MG/ML IJ SOLN
INTRAMUSCULAR | Status: DC | PRN
  Administered 2021-01-10: .2 mg via INTRAVENOUS

## 2021-01-10 MED ORDER — 0.9 % SODIUM CHLORIDE (POUR BTL) OPTIME
TOPICAL | Status: DC | PRN
  Administered 2021-01-10: 100 mL

## 2021-01-10 MED ORDER — HYDROMORPHONE HCL 1 MG/ML IJ SOLN
INTRAMUSCULAR | Status: DC | PRN
  Administered 2021-01-10 (×2): .5 mg via INTRAVENOUS

## 2021-01-10 MED ORDER — FENTANYL CITRATE (PF) 100 MCG/2ML IJ SOLN
INTRAMUSCULAR | Status: AC
  Administered 2021-01-10: 50 ug via INTRAVENOUS
  Filled 2021-01-10: qty 2

## 2021-01-10 MED ORDER — METOPROLOL SUCCINATE ER 25 MG PO TB24
25.0000 mg | ORAL_TABLET | Freq: Every day | ORAL | Status: DC
  Administered 2021-01-12: 10:00:00 25 mg via ORAL
  Filled 2021-01-10 (×2): qty 1

## 2021-01-10 MED ORDER — IRBESARTAN 150 MG PO TABS
300.0000 mg | ORAL_TABLET | Freq: Every day | ORAL | Status: DC
  Administered 2021-01-10 – 2021-01-12 (×2): 300 mg via ORAL
  Filled 2021-01-10 (×3): qty 2

## 2021-01-10 MED ORDER — DIPHENHYDRAMINE HCL 12.5 MG/5ML PO ELIX
12.5000 mg | ORAL_SOLUTION | Freq: Four times a day (QID) | ORAL | Status: DC | PRN
  Filled 2021-01-10: qty 5

## 2021-01-10 MED ORDER — ACETAMINOPHEN 500 MG PO TABS
ORAL_TABLET | ORAL | Status: AC
  Administered 2021-01-10: 1000 mg via ORAL
  Filled 2021-01-10: qty 2

## 2021-01-10 MED ORDER — ORAL CARE MOUTH RINSE: 15.0000 mL | Freq: Once | OROMUCOSAL | Status: AC

## 2021-01-10 MED ORDER — LABETALOL HCL 5 MG/ML IV SOLN
20.0000 mg | INTRAVENOUS | Status: DC | PRN
  Administered 2021-01-10: 20 mg via INTRAVENOUS
  Filled 2021-01-10 (×2): qty 4

## 2021-01-10 MED ORDER — FENTANYL CITRATE (PF) 100 MCG/2ML IJ SOLN
INTRAMUSCULAR | Status: AC
  Filled 2021-01-10: qty 2

## 2021-01-10 MED ORDER — ACETAMINOPHEN 500 MG PO TABS
1000.0000 mg | ORAL_TABLET | Freq: Four times a day (QID) | ORAL | Status: DC
  Administered 2021-01-10 – 2021-01-12 (×8): 1000 mg via ORAL
  Filled 2021-01-10 (×9): qty 2

## 2021-01-10 MED ORDER — CHLORHEXIDINE GLUCONATE 0.12 % MT SOLN
OROMUCOSAL | Status: AC
  Administered 2021-01-10: 15 mL via OROMUCOSAL
  Filled 2021-01-10: qty 15

## 2021-01-10 MED ORDER — ROCURONIUM BROMIDE 100 MG/10ML IV SOLN
INTRAVENOUS | Status: DC | PRN
  Administered 2021-01-10 (×2): 20 mg via INTRAVENOUS
  Administered 2021-01-10: 40 mg via INTRAVENOUS
  Administered 2021-01-10: 30 mg via INTRAVENOUS

## 2021-01-10 MED ORDER — MORPHINE SULFATE (PF) 2 MG/ML IV SOLN
1.0000 mg | Freq: Once | INTRAVENOUS | Status: AC
  Administered 2021-01-10: 1 mg via INTRAVENOUS
  Filled 2021-01-10: qty 1

## 2021-01-10 MED ORDER — FENTANYL CITRATE (PF) 100 MCG/2ML IJ SOLN
INTRAMUSCULAR | Status: AC
  Administered 2021-01-10: 25 ug via INTRAVENOUS
  Filled 2021-01-10: qty 2

## 2021-01-10 MED ORDER — DIPHENHYDRAMINE HCL 50 MG/ML IJ SOLN: 12.5000 mg | Freq: Four times a day (QID) | INTRAMUSCULAR | Status: DC | PRN

## 2021-01-10 MED ORDER — SODIUM CHLORIDE 0.9 % IV SOLN
25.0000 mg | Freq: Four times a day (QID) | INTRAVENOUS | Status: DC | PRN
  Filled 2021-01-10: qty 1

## 2021-01-10 MED ORDER — MORPHINE SULFATE 1 MG/ML IV SOLN PCA
INTRAVENOUS | Status: DC
  Administered 2021-01-11: 8 mL via INTRAVENOUS
  Administered 2021-01-11: 14.63 mg via INTRAVENOUS
  Administered 2021-01-11: 4 mL via INTRAVENOUS

## 2021-01-10 MED ORDER — NALOXONE HCL 0.4 MG/ML IJ SOLN: 0.4000 mg | INTRAMUSCULAR | Status: DC | PRN

## 2021-01-10 MED ORDER — AMLODIPINE BESYLATE 10 MG PO TABS
10.0000 mg | ORAL_TABLET | Freq: Every day | ORAL | Status: DC
  Administered 2021-01-10 – 2021-01-12 (×2): 10 mg via ORAL
  Filled 2021-01-10 (×3): qty 1

## 2021-01-10 MED ORDER — ONDANSETRON HCL 4 MG PO TABS: 4.0000 mg | ORAL_TABLET | Freq: Four times a day (QID) | ORAL | Status: DC | PRN

## 2021-01-10 MED ORDER — PROMETHAZINE HCL 25 MG/ML IJ SOLN: 6.2500 mg | INTRAMUSCULAR | Status: DC | PRN

## 2021-01-10 MED ORDER — LABETALOL HCL 5 MG/ML IV SOLN
INTRAVENOUS | Status: DC | PRN
  Administered 2021-01-10 (×2): 5 mg via INTRAVENOUS

## 2021-01-10 MED ORDER — LIDOCAINE HCL (CARDIAC) PF 100 MG/5ML IV SOSY
PREFILLED_SYRINGE | INTRAVENOUS | Status: DC | PRN
  Administered 2021-01-10: 100 mg via INTRAVENOUS

## 2021-01-10 MED ORDER — OXYCODONE HCL 5 MG PO TABS
ORAL_TABLET | ORAL | Status: AC
  Filled 2021-01-10: qty 2

## 2021-01-10 MED ORDER — ONDANSETRON HCL 4 MG/2ML IJ SOLN
4.0000 mg | Freq: Four times a day (QID) | INTRAMUSCULAR | Status: DC | PRN
  Filled 2021-01-10: qty 2

## 2021-01-10 MED ORDER — INSULIN ASPART 100 UNIT/ML IJ SOLN: 0.0000 [IU] | Freq: Three times a day (TID) | INTRAMUSCULAR | Status: DC

## 2021-01-10 MED ORDER — INSULIN GLARGINE-YFGN 100 UNIT/ML ~~LOC~~ SOLN
24.0000 [IU] | Freq: Every day | SUBCUTANEOUS | Status: DC
  Filled 2021-01-10: qty 0.24

## 2021-01-10 MED ORDER — PHENYLEPHRINE 40 MCG/ML (10ML) SYRINGE FOR IV PUSH (FOR BLOOD PRESSURE SUPPORT)
PREFILLED_SYRINGE | INTRAVENOUS | Status: DC | PRN
  Administered 2021-01-10 (×3): 160 ug via INTRAVENOUS

## 2021-01-10 MED ORDER — HYDRALAZINE HCL 10 MG PO TABS
10.0000 mg | ORAL_TABLET | Freq: Every day | ORAL | Status: DC
  Administered 2021-01-12: 10:00:00 10 mg via ORAL
  Filled 2021-01-10 (×2): qty 1

## 2021-01-10 MED ORDER — SPIRONOLACTONE 25 MG PO TABS
25.0000 mg | ORAL_TABLET | Freq: Every day | ORAL | Status: DC
  Administered 2021-01-12: 10:00:00 25 mg via ORAL
  Filled 2021-01-10 (×2): qty 1

## 2021-01-10 MED ORDER — KETOROLAC TROMETHAMINE 30 MG/ML IJ SOLN
30.0000 mg | Freq: Four times a day (QID) | INTRAMUSCULAR | Status: AC
  Administered 2021-01-10 – 2021-01-11 (×3): 30 mg via INTRAVENOUS
  Filled 2021-01-10 (×4): qty 1

## 2021-01-10 MED ORDER — SUGAMMADEX SODIUM 500 MG/5ML IV SOLN
INTRAVENOUS | Status: DC | PRN
  Administered 2021-01-10: 500 mg via INTRAVENOUS
  Administered 2021-01-10: 50 mg via INTRAVENOUS

## 2021-01-10 MED ORDER — EPHEDRINE SULFATE 50 MG/ML IJ SOLN
INTRAMUSCULAR | Status: DC | PRN
  Administered 2021-01-10: 10 mg via INTRAVENOUS

## 2021-01-10 MED ORDER — OXYCODONE HCL 5 MG PO TABS
5.0000 mg | ORAL_TABLET | ORAL | Status: DC | PRN
  Administered 2021-01-10 – 2021-01-12 (×7): 10 mg via ORAL
  Filled 2021-01-10 (×7): qty 2

## 2021-01-10 MED ORDER — HYDROMORPHONE HCL 1 MG/ML IJ SOLN
INTRAMUSCULAR | Status: AC
  Filled 2021-01-10: qty 1

## 2021-01-10 MED ORDER — FENTANYL CITRATE (PF) 100 MCG/2ML IJ SOLN
25.0000 ug | INTRAMUSCULAR | Status: DC | PRN
  Administered 2021-01-10: 50 ug via INTRAVENOUS
  Administered 2021-01-10: 25 ug via INTRAVENOUS

## 2021-01-10 MED ORDER — GABAPENTIN 300 MG PO CAPS: 300.0000 mg | ORAL_CAPSULE | ORAL | Status: DC

## 2021-01-10 MED ORDER — STERILE WATER FOR IRRIGATION IR SOLN
Status: DC | PRN
  Administered 2021-01-10: 1 mL

## 2021-01-10 MED ORDER — DEXAMETHASONE SODIUM PHOSPHATE 10 MG/ML IJ SOLN
INTRAMUSCULAR | Status: DC | PRN
  Administered 2021-01-10: 10 mg via INTRAVENOUS

## 2021-01-10 SURGICAL SUPPLY — 44 items
CHLORAPREP W/TINT 26 (MISCELLANEOUS) ×4 IMPLANT
CNTNR SPEC 2.5X3XGRAD LEK (MISCELLANEOUS) ×2
CONT SPEC 4OZ STER OR WHT (MISCELLANEOUS) ×2
CONTAINER SPEC 2.5X3XGRAD LEK (MISCELLANEOUS) IMPLANT
DRAPE LAP W/FLUID (DRAPES) ×4 IMPLANT
DRAPE UNDER BUTTOCK W/FLU (DRAPES) ×4 IMPLANT
DRSG TELFA 3X8 NADH (GAUZE/BANDAGES/DRESSINGS) ×4 IMPLANT
ELECT REM PT RETURN 9FT ADLT (ELECTROSURGICAL) ×4
ELECTRODE REM PT RTRN 9FT ADLT (ELECTROSURGICAL) ×2 IMPLANT
GAUZE 4X4 16PLY ~~LOC~~+RFID DBL (SPONGE) ×8 IMPLANT
GAUZE SPONGE 4X4 12PLY STRL (GAUZE/BANDAGES/DRESSINGS) ×4 IMPLANT
GLOVE SURG ENC MOIS LTX SZ7 (GLOVE) ×4 IMPLANT
GLOVE SURG SYN 8.0 (GLOVE) ×4 IMPLANT
GLOVE SURG SYN 8.0 PF PI (GLOVE) ×2 IMPLANT
GLOVE SURG UNDER POLY LF SZ6.5 (GLOVE) ×4 IMPLANT
GOWN STRL REUS W/ TWL LRG LVL3 (GOWN DISPOSABLE) ×4 IMPLANT
GOWN STRL REUS W/ TWL XL LVL3 (GOWN DISPOSABLE) ×2 IMPLANT
GOWN STRL REUS W/TWL LRG LVL3 (GOWN DISPOSABLE) ×4
GOWN STRL REUS W/TWL XL LVL3 (GOWN DISPOSABLE) ×2
KIT TURNOVER CYSTO (KITS) ×4 IMPLANT
LABEL OR SOLS (LABEL) ×4 IMPLANT
MANIFOLD NEPTUNE II (INSTRUMENTS) ×4 IMPLANT
NEEDLE HYPO 22GX1.5 SAFETY (NEEDLE) ×8 IMPLANT
PACK BASIN MAJOR ARMC (MISCELLANEOUS) ×4 IMPLANT
PAD DRESSING TELFA 3X8 NADH (GAUZE/BANDAGES/DRESSINGS) ×2 IMPLANT
PENCIL SMOKE EVACUATOR (MISCELLANEOUS) ×2 IMPLANT
SCRUB EXIDINE 4% CHG 4OZ (MISCELLANEOUS) ×4 IMPLANT
SET CYSTO W/LG BORE CLAMP LF (SET/KITS/TRAYS/PACK) ×2 IMPLANT
SOL PREP PVP 2OZ (MISCELLANEOUS) ×4
SOLUTION PREP PVP 2OZ (MISCELLANEOUS) ×2 IMPLANT
SPONGE T-LAP 18X18 ~~LOC~~+RFID (SPONGE) ×8 IMPLANT
STAPLER INSORB 30 2030 C-SECTI (MISCELLANEOUS) ×2 IMPLANT
SUT PDS AB 1 TP1 96 (SUTURE) ×2 IMPLANT
SUT VIC AB 0 CT1 27 (SUTURE) ×10
SUT VIC AB 0 CT1 27XCR 8 STRN (SUTURE) ×4 IMPLANT
SUT VIC AB 0 CT1 36 (SUTURE) ×10 IMPLANT
SUT VIC AB 2-0 SH 27 (SUTURE) ×8
SUT VIC AB 2-0 SH 27XBRD (SUTURE) IMPLANT
SYR 20ML LL LF (SYRINGE) ×8 IMPLANT
SYR 30ML LL (SYRINGE) ×4 IMPLANT
SYR BULB IRRIG 60ML STRL (SYRINGE) ×4 IMPLANT
TRAY FOLEY MTR SLVR 16FR STAT (SET/KITS/TRAYS/PACK) ×4 IMPLANT
WATER STERILE IRR 1000ML POUR (IV SOLUTION) ×4 IMPLANT
WATER STERILE IRR 500ML POUR (IV SOLUTION) ×2 IMPLANT

## 2021-01-10 NOTE — Op Note (Signed)
NAME: Claire Hansen, Claire Hansen MEDICAL RECORD NO: [id number] ACCOUNT NO: [id number] DATE OF BIRTH: June 29, 1960 FACILITY: [facility] LOCATION: [facility] PHYSICIAN: Boykin Nearing, MD  Operative Report   DATE OF PROCEDURE: 01/10/2021  PREOPERATIVE DIAGNOSIS: 1.  Symptomatic fibroid uterus. 2.  Left ovarian cyst.  POSTOPERATIVE DIAGNOSES: 1.  Symptomatic fibroid uterus. 2.  Left ovarian cyst.  PROCEDURE: 1.  Abdominal supracervical hysterectomy. 2.  Bilateral salpingo-oophorectomy. 3.  Cystoscopy   ANESTHESIA:  General endotracheal anesthesia.  SURGEON:  Boykin Nearing, MD  ASSISTANT:  Benjaman Kindler, MD  INDICATIONS:  A 60 year old gravida 2, para 2, patient with pelvic pain and pressure, heaviness and known to have a large fibroid uterus with one fibroid measuring 10 cm.  The patient has elected for definitive surgery.  The patient also noted to have on  a previous ultrasound a complex left ovarian cyst.  DESCRIPTION OF PROCEDURE:  After general endotracheal anesthesia, the patient was placed in dorsal supine position with the legs in the Kelly stirrups.  The patient's abdomen, perineum and vagina were prepped and draped in normal sterile fashion.  A  timeout was performed.  Traxi pannus elevator was used to elevate her pannus cephalad.  A Pfannenstiel incision was made 2 fingerbreadths above the symphysis pubis.  Sharp dissection was used to identify the fascia.  Fascia was opened in the midline and  opened in a transverse fashion.  The superior aspect of the fascia was grasped with Kocher clamps.  Recti muscles were dissected free.  Inferior aspect of the fascia was grasped with Kocher clamp.  Pyramidalis muscle was dissected free.  Entry into the  peritoneal cavity was accomplished sharply.  A large bulbous uterus was elevated through the incision and windows were identified in the broad ligament, juxtaposed to the uterine corpus.  Double Heaney clamps were used to clamp the  infundibulopelvic  ligament on each side.  The ligament was transected and doubly ligated with 0 Vicryl suture.  Sequential clamping of the broad ligaments bilaterally ensued with suturing as we dissected towards the cervix.  Bladder flap was created and was taken down  sharply and with a sponge stick.  The uterine arteries were bilaterally clamped, transected and suture ligated with 0 Vicryl suture.  Given the size of the uterus, the uterus was then amputated and passed off the operative field.  The cardinal ligaments  were then clamped with straight Heaney clamps, transected, and suture ligated with 0 Vicryl suture.  The cervix was extremely long, measuring at least 5 cm and was extremely deep into this obese patient's pelvis.  Ultimately, decision was made to perform  a supracervical hysterectomy given the depth and the proximity to the bladder.  The transected area was oversewn with interrupted 0 Vicryl suture.  Good hemostasis was noted.  The patient's abdomen was copiously irrigated.  Several figure-of-eight 2-0  Vicryl sutures were used for hemostasis.  Pedicles appeared hemostatic and all packings were removed as well as the O'Connor-O'Sullivan retractor that was used after amputation of the uterus.  Sponge count was correct.  The fascia was then closed with 0  Vicryl suture in a running nonlocking fashion, two separate sutures used.  The fascial edges were injected with a solution of 60 mL of 0.5% Marcaine with 20 mL normal saline, 40 mL of this solution was injected.  Subcutaneous tissues were irrigated and  bovied for hemostasis of the skin was reapproximated with Insorb absorbable staples.  Good cosmetic effect.  Given the difficulty of the procedure,  a cystoscopy was performed at the end of the procedure to ensure ureteral patency.  A 0.5 mL of  fluorescein was injected intravenously and cystoscopy was performed with normal pluming of urine from each ureteral orifice.  Foley catheter, which was  removed was replaced and the procedure was terminated.  ESTIMATED BLOOD LOSS:  200 mL.  INTRAOPERATIVE FLUIDS:  1000 mL  URINE OUTPUT:  400 mL.  The patient tolerated the procedure well and was taken to recovery room in good condition.   PUS D: 01/10/2021 12:36:04 pm T: 01/10/2021 1:30:00 pm  JOB: [id number]/ [id number]

## 2021-01-10 NOTE — Progress Notes (Signed)
RN called to patient room by patient's RN at 20:45. Patient bleeding from right side of surgical incision. Blood and clots on bed chuck. Clots under patient pressure dressing as well. Chuck and loose clots weighed. EBL 205g. Right side of incision area and above swollen, hard, and tender to the touch. No discoloration at this time. Left side WNL. Patient vitals stable and WNL. BP 131/79, pulse 94 at 20:45. Previous set of vitals at 20:10 (BP 135/80, pulse 95). Patient states she has no dizziness or lightheadedness at this time. RN updated Dr. Ouida Sills at 20:55 about aforementioned patient condition. Per Dr. Ouida Sills, RN to "replace pressure dressing, making it tight." Pressure dressing replaced with 2 RNs at 21:15; ABD and gauze underneath.  Pain 5/10. Patient has PCA for pain control. Patient's RN will continue to monitor.

## 2021-01-10 NOTE — Progress Notes (Signed)
Received patient from PACU.  Post-op CBG 261,  Anesthesiologist stated not to treat, per PACU RN.  Spoke with Diabetes Coordinator, who will look at medications and diet to make a plan for patient while inpatient.

## 2021-01-10 NOTE — Anesthesia Preprocedure Evaluation (Signed)
Anesthesia Evaluation  Patient identified by MRN, date of birth, ID band Patient awake    Reviewed: Allergy & Precautions, NPO status , Patient's Chart, lab work & pertinent test results  History of Anesthesia Complications Negative for: history of anesthetic complications  Airway Mallampati: II  TM Distance: >3 FB Neck ROM: Full    Dental  (+) Dental Advidsory Given, Teeth Intact   Pulmonary neg shortness of breath, asthma , neg sleep apnea, neg recent URI,    breath sounds clear to auscultation- rhonchi (-) wheezing      Cardiovascular hypertension, Pt. on medications (-) angina(-) CAD, (-) Past MI, (-) Cardiac Stents and (-) CABG (-) dysrhythmias (-) Valvular Problems/Murmurs Rhythm:Regular Rate:Normal - Systolic murmurs and - Diastolic murmurs    Neuro/Psych neg Seizures Anxiety negative neurological ROS     GI/Hepatic negative GI ROS, Neg liver ROS,   Endo/Other  diabetes, Oral Hypoglycemic AgentsMorbid obesity  Renal/GU negative Renal ROS     Musculoskeletal  (+) Arthritis ,   Abdominal (+) + obese,   Peds  Hematology negative hematology ROS (+)   Anesthesia Other Findings Past Medical History: No date: Allergy No date: Anxiety No date: Asthma     Comment:  pt denies having asthma No date: Chronic osteoarthritis     Comment:  knees No date: Deviated septum No date: Diabetes mellitus without complication (HCC) No date: Hyperlipidemia No date: Hypertension No date: Irregular menstrual cycle No date: Lymphadenopathy No date: Obesity No date: Peri-menopause 2015: Pneumonia   Reproductive/Obstetrics                             Anesthesia Physical  Anesthesia Plan  ASA: 3  Anesthesia Plan: General   Post-op Pain Management:    Induction: Intravenous  PONV Risk Score and Plan: 2 and Ondansetron, Dexamethasone, Midazolam, Treatment may vary due to age or medical condition  and Promethazine  Airway Management Planned: Oral ETT  Additional Equipment:   Intra-op Plan:   Post-operative Plan: Extubation in OR  Informed Consent: I have reviewed the patients History and Physical, chart, labs and discussed the procedure including the risks, benefits and alternatives for the proposed anesthesia with the patient or authorized representative who has indicated his/her understanding and acceptance.     Dental advisory given  Plan Discussed with: CRNA and Anesthesiologist  Anesthesia Plan Comments:         Anesthesia Quick Evaluation

## 2021-01-10 NOTE — Anesthesia Postprocedure Evaluation (Signed)
Anesthesia Post Note  Patient: Claire Hansen  Procedure(s) Performed: HYSTERECTOMY ABDOMINAL WITH  BILATERA SALPINGO OOPHORECTOMY (Bilateral) CYSTOSCOPY  Patient location during evaluation: PACU Anesthesia Type: General Level of consciousness: awake and alert Pain management: pain level controlled Vital Signs Assessment: post-procedure vital signs reviewed and stable Respiratory status: spontaneous breathing, nonlabored ventilation, respiratory function stable and patient connected to nasal cannula oxygen Cardiovascular status: blood pressure returned to baseline and stable Postop Assessment: no apparent nausea or vomiting Anesthetic complications: no   No notable events documented.   Last Vitals:  Vitals:   01/10/21 1935 01/10/21 2010  BP: (!) 163/88 135/80  Pulse: 95 95  Resp: 18   Temp: 36.9 C   SpO2: 96%     Last Pain:  Vitals:   01/10/21 1935  TempSrc: Oral  PainSc:                  [name]

## 2021-01-10 NOTE — Transfer of Care (Signed)
Immediate Anesthesia Transfer of Care Note  Patient: Claire Hansen  Procedure(s) Performed: HYSTERECTOMY ABDOMINAL WITH  BILATERA SALPINGO OOPHORECTOMY (Bilateral) CYSTOSCOPY  Patient Location: PACU  Anesthesia Type:General  Level of Consciousness: awake, drowsy and patient cooperative  Airway & Oxygen Therapy: Patient Spontanous Breathing and Patient connected to face mask oxygen  Post-op Assessment: Report given to RN and Post -op Vital signs reviewed and stable  Post vital signs: Reviewed and stable  Last Vitals:  Vitals Value Taken Time  BP 200/107 01/10/21 1105  Temp    Pulse 86 01/10/21 1108  Resp 21 01/10/21 1108  SpO2 97 % 01/10/21 1108  Vitals shown include unvalidated device data.  Last Pain:  Vitals:   01/10/21 0635  TempSrc: Oral  PainSc: 7          Complications: No notable events documented.

## 2021-01-10 NOTE — Progress Notes (Signed)
Patient ID: Claire Hansen, female   DOB: 1960/08/08, 60 y.o.   MRN: [id number] DOS . Pain 8+/10 . Switching to PCA .  Glucose values elevated probably secondary to Decadron that she received . Add Semglee 10 unit tonight  IS in use   Clear liquids  SCD on

## 2021-01-10 NOTE — Progress Notes (Addendum)
Inpatient Diabetes Program Recommendations  AACE/ADA: New Consensus Statement on Inpatient Glycemic Control (2015)  Target Ranges:  Prepandial:   less than 140 mg/dL      Peak postprandial:   less than 180 mg/dL (1-2 hours)      Critically ill patients:  140 - 180 mg/dL   Lab Results  Component Value Date   GLUCAP 261 (H) 01/10/2021   HGBA1C 9.3 (H) 03/22/2020    Review of Glycemic Control  Latest Reference Range & Units 01/10/21 06:14 01/10/21 11:09  Glucose-Capillary 70 - 99 mg/dL 178 (H) 261 (H)   Diabetes history: DM 2 Outpatient Diabetes medications:  Lantus 24 units q HS--> PATIENT NOT TAKING Metformin 500 mg bid Humalog 6 units with breakfast, 5 units with lunch and 6 units with dinner-PATIENT NOT TAKING Current orders for Inpatient glycemic control:  None  Inpatient Diabetes Program Recommendations:   Patient received Decadron 10 mg in OR as well which will likely increase blood sugars.  Consider adding Semglee 24 units q HS and  Novolog sensitive correction tid with meals and HS. Called and discussed with Dr. Ouida Sills and orders received.   Thanks,  Adah Perl, RN, BC-ADM Inpatient Diabetes Coordinator Pager [phone]   (8a-5p)  Addendum 1500- Spoke with patient and her husband at bedside- they state that patient was NOT taking Lantus or Humalog prior to admit and that patient was only taking insulin when she was in rehab back in August.  She states that her last A1C at her PCP visit was 7% with only metformin.  Called Dr. Ouida Sills back to explain and orders received to d/c [facility] since patient was no longer taking this at home.  Discussed with patient and husband that she would be getting Novolog correction for blood sugars while in the hospital and they verbalized understanding.

## 2021-01-10 NOTE — Brief Op Note (Signed)
01/10/2021  10:45 AM  PATIENT:  Claire Hansen  60 y.o. female  PRE-OPERATIVE DIAGNOSIS:  symptomatic fibroid uterus  POST-OPERATIVE DIAGNOSIS:  symptomatic fibroid uterus  PROCEDURE:  Procedure(s): HYSTERECTOMY ABDOMINAL WITH  BILATERA SALPINGO OOPHORECTOMY (Bilateral) Abdominal supracervical hysterectomy , BSO, cystoscopy SURGEON:  Surgeon(s) and Role:    * Yaminah Clayborn, Gwen Her, MD - Primary    * Benjaman Kindler, MD - Assisting  PHYSICIAN ASSISTANT:   ASSISTANTS: cst   ANESTHESIA:   general  EBL:200 CC EBL, IOF 1000 cc , ou 400 cc  BLOOD ADMINISTERED:none  DRAINS: Urinary Catheter (Foley)   LOCAL MEDICATIONS USED:  MARCAINE     SPECIMEN:  Source of Specimen:  uterus bilateral ovaries and fallopian tubes   DISPOSITION OF SPECIMEN:  PATHOLOGY  COUNTS:  YES  TOURNIQUET:  * No tourniquets in log *  DICTATION: .Other Dictation: Dictation Number verbal  PLAN OF CARE: Admit to inpatient   PATIENT DISPOSITION:  PACU - hemodynamically stable.   Delay start of Pharmacological VTE agent (>24hrs) due to surgical blood loss or risk of bleeding: not applicable

## 2021-01-10 NOTE — Anesthesia Procedure Notes (Signed)
Procedure Name: Intubation Date/Time: 01/10/2021 7:41 AM Performed by: Kelton Pillar, CRNA Pre-anesthesia Checklist: Patient identified, Emergency Drugs available, Suction available and Patient being monitored Patient Re-evaluated:Patient Re-evaluated prior to induction Oxygen Delivery Method: Circle system utilized Preoxygenation: Pre-oxygenation with 100% oxygen Induction Type: IV induction Ventilation: Mask ventilation without difficulty Laryngoscope Size: McGraph and 3 Grade View: Grade I Tube type: Oral Tube size: 7.0 mm Number of attempts: 1 Airway Equipment and Method: Stylet and Oral airway Placement Confirmation: ETT inserted through vocal cords under direct vision, positive ETCO2, breath sounds checked- equal and bilateral and CO2 detector Secured at: 21 cm Tube secured with: Tape Dental Injury: Teeth and Oropharynx as per pre-operative assessment

## 2021-01-10 NOTE — Progress Notes (Signed)
Pt is ready for TAH / BSo for a symptomatic fibroid UTX . All questions answered . LAbs reviewed . Proceed

## 2021-01-11 ENCOUNTER — Encounter: Payer: Self-pay | Admitting: Obstetrics and Gynecology

## 2021-01-11 LAB — GLUCOSE, CAPILLARY
Glucose-Capillary: 157 mg/dL — ABNORMAL HIGH (ref 70–99)
Glucose-Capillary: 168 mg/dL — ABNORMAL HIGH (ref 70–99)
Glucose-Capillary: 151 mg/dL — ABNORMAL HIGH (ref 70–99)
Glucose-Capillary: 183 mg/dL — ABNORMAL HIGH (ref 70–99)

## 2021-01-11 LAB — CBC
HCT: 31.6 % — ABNORMAL LOW (ref 36.0–46.0)
HCT: 29.7 % — ABNORMAL LOW (ref 36.0–46.0)
Hemoglobin: 10.5 g/dL — ABNORMAL LOW (ref 12.0–15.0)
Hemoglobin: 9.8 g/dL — ABNORMAL LOW (ref 12.0–15.0)
MCH: 28.4 pg (ref 26.0–34.0)
MCH: 28.6 pg (ref 26.0–34.0)
MCHC: 33.2 g/dL (ref 30.0–36.0)
MCHC: 33 g/dL (ref 30.0–36.0)
MCV: 85.4 fL (ref 80.0–100.0)
MCV: 86.6 fL (ref 80.0–100.0)
Platelets: 279 10*3/uL (ref 150–400)
Platelets: 251 10*3/uL (ref 150–400)
RBC: 3.7 MIL/uL — ABNORMAL LOW (ref 3.87–5.11)
RBC: 3.43 MIL/uL — ABNORMAL LOW (ref 3.87–5.11)
RDW: 15 % (ref 11.5–15.5)
RDW: 15.1 % (ref 11.5–15.5)
WBC: 9.2 10*3/uL (ref 4.0–10.5)
WBC: 8.7 10*3/uL (ref 4.0–10.5)
nRBC: 0 % (ref 0.0–0.2)
nRBC: 0 % (ref 0.0–0.2)

## 2021-01-11 LAB — BASIC METABOLIC PANEL
Anion gap: 8 (ref 5–15)
BUN: 23 mg/dL — ABNORMAL HIGH (ref 6–20)
CO2: 25 mmol/L (ref 22–32)
Calcium: 9.2 mg/dL (ref 8.9–10.3)
Chloride: 104 mmol/L (ref 98–111)
Creatinine, Ser: 1.27 mg/dL — ABNORMAL HIGH (ref 0.44–1.00)
GFR, Estimated: 49 mL/min — ABNORMAL LOW (ref >60)
Glucose, Bld: 158 mg/dL — ABNORMAL HIGH (ref 70–99)
Potassium: 4.2 mmol/L (ref 3.5–5.1)
Sodium: 137 mmol/L (ref 135–145)

## 2021-01-11 MED ORDER — LACTATED RINGERS IV BOLUS
250.0000 mL | Freq: Once | INTRAVENOUS | Status: AC
  Administered 2021-01-11: 250 mL via INTRAVENOUS

## 2021-01-11 MED ORDER — IBUPROFEN 600 MG PO TABS
600.0000 mg | ORAL_TABLET | Freq: Three times a day (TID) | ORAL | Status: DC | PRN
  Administered 2021-01-11 – 2021-01-12 (×3): 600 mg via ORAL
  Filled 2021-01-11 (×4): qty 1

## 2021-01-11 MED ORDER — METFORMIN HCL 500 MG PO TABS
500.0000 mg | ORAL_TABLET | Freq: Two times a day (BID) | ORAL | Status: DC
  Administered 2021-01-11 – 2021-01-12 (×2): 500 mg via ORAL
  Filled 2021-01-11 (×3): qty 1

## 2021-01-11 NOTE — Progress Notes (Signed)
While RN assessing pt RN noted blood leaking from right side of pressure dressing and noted clots and blood on bed pad. Pt was not having any symptoms (dizziness, lightheaded) only stating right side was more tender than left. Called another RN to bedside to assist with assessment.

## 2021-01-11 NOTE — Evaluation (Signed)
Physical Therapy Evaluation Patient Details Name: Claire Hansen MRN: [id number] DOB: 08-Oct-1960 Today's Date: 01/11/2021  History of Present Illness  Pt is a 60 yo female s/p Abdominal supracervical hysterectomy and bilateral salpingo-oophorectomy. PMH of anxiety, DM, anxiety, HTN, lumbar fusion.   Clinical Impression  The patient was A&Ox4, reported 5/10 abdominal pain, 6/10 at end of session. Pt stated she lives with her husband on a 3rd floor apartment, and her son has been her primary caregiver in the past. She requires assistance with ADLs, and some mobility (CGA for safety, and physical assist needed for stairs). Denied any falls in the last 6 months.  The patient was able to perform the log roll technique with bed rails from a flat bed; family able to coach pt through verbally of proper tehcnique, no physical assistance provided. Good sitting balance noted. Pt son Claire Hansen) able to provide assistance to don shoes and ace wraps bilaterally that aide with foot drop. Sit <> stand with RW and CGA, and pt able to ambulate ~28ft with RW and CGA. Several standing rest breaks needed, and with fatigue and increased pain, increased trunk flexion and reliance on RW noted. No LOB.  Overall the patient demonstrated deficits (see "PT Problem List") that impede the patient's functional abilities, safety, and mobility and would benefit from skilled PT intervention. Recommendation is HHPT at this time to maximize pt function, safety, and independence.    Patient suffers from bilateral foot drop, history of lumbar fusion, hip surgery which impairs his/her ability to perform daily activities like toileting, feeding, dressing, grooming, bathing. A cane, walker, crutch will not resolve the patient's issue with performing activities of daily living such as community activities/IADLs. A lightweight wheelchair and cushion is required/recommended and will allow patient to safely perform daily activities.   Patient  can safely propel the wheelchair in the home or has a caregiver who can provide assistance.          Recommendations for follow up therapy are one component of a multi-disciplinary discharge planning process, led by the attending physician.  Recommendations may be updated based on patient status, additional functional criteria and insurance authorization.  Follow Up Recommendations Home health PT    Assistance Recommended at Discharge Frequent or constant Supervision/Assistance  Functional Status Assessment Patient has had a recent decline in their functional status and demonstrates the ability to make significant improvements in function in a reasonable and predictable amount of time.  Equipment Recommendations  Wheelchair cushion (measurements PT);Wheelchair (measurements PT)    Recommendations for Other Services       Precautions / Restrictions Precautions Precautions: Fall Required Braces or Orthoses: Other Brace Other Brace: bilateral AFOs, or ace wraps to address foot drop Restrictions Weight Bearing Restrictions: No      Mobility  Bed Mobility Overal bed mobility: Needs Assistance Bed Mobility: Rolling;Sidelying to Sit Rolling: Supervision Sidelying to sit: Supervision       General bed mobility comments: use of bed rails, family able to instruct pt on proper log rolling technique    Transfers Overall transfer level: Needs assistance Equipment used: Rolling walker (2 wheels) Transfers: Sit to/from Stand Sit to Stand: Min guard                Ambulation/Gait   Gait Distance (Feet): 240 Feet Assistive device: Rolling walker (2 wheels)   Gait velocity: decreased     General Gait Details: several standing rest breaks needed. pt fatigued at end of ambulation. no LOB noted. flexed trunk  due to abdominal pain  Stairs            Wheelchair Mobility    Modified Rankin (Stroke Patients Only)       Balance Overall balance assessment: Needs  assistance Sitting-balance support: Feet supported Sitting balance-Leahy Scale: Good     Standing balance support: Reliant on assistive device for balance;During functional activity Standing balance-Leahy Scale: Poor                               Pertinent Vitals/Pain Pain Assessment: 0-10 Pain Score: 6  Pain Location: abdomen Pain Descriptors / Indicators: Sore Pain Intervention(s): Limited activity within patient's tolerance;Monitored during session;Repositioned    Home Living Family/patient expects to be discharged to:: Private residence Living Arrangements: Spouse/significant other;Children Available Help at Discharge: Family;Available 24 hours/day Type of Home: Apartment Home Access: Stairs to enter Entrance Stairs-Rails: Right;Left;Can reach both Entrance Stairs-Number of Steps: 40 (3rd floor with 2 x 20 steps to enter apt)   Home Layout: One level Home Equipment: Grab bars - tub/shower;BSC/3in1;Rolling Walker (2 wheels);Tub bench;Cane - single point;Transport chair;Toilet riser      Prior Function Prior Level of Function : Needs assist       Physical Assist : Mobility (physical);ADLs (physical) Mobility (physical): Gait;Stairs;Transfers ADLs (physical): IADLs;Dressing;Bathing Mobility Comments: assist with gait (CGA predominantly), stairs required physical assist ADLs Comments: assist needed for LE body dressing, donning/doffing AFOs or ace wraps     Hand Dominance        Extremity/Trunk Assessment   Upper Extremity Assessment Upper Extremity Assessment: Overall WFL for tasks assessed    Lower Extremity Assessment Lower Extremity Assessment: RLE deficits/detail;LLE deficits/detail RLE Deficits / Details: no ankle DF/PF noted LLE Deficits / Details: some ankle PF present, some inv/ever minimal to no DF    Cervical / Trunk Assessment Cervical / Trunk Assessment: Normal  Communication   Communication: No difficulties  Cognition  Arousal/Alertness: Awake/alert Behavior During Therapy: WFL for tasks assessed/performed Overall Cognitive Status: Within Functional Limits for tasks assessed                                          General Comments      Exercises     Assessment/Plan    PT Assessment Patient needs continued PT services  PT Problem List Decreased strength;Decreased mobility;Decreased balance;Decreased activity tolerance;Pain       PT Treatment Interventions DME instruction;Therapeutic exercise;Gait training;Balance training;Stair training;Neuromuscular re-education;Functional mobility training;Therapeutic activities;Patient/family education    PT Goals (Current goals can be found in the Care Plan section)  Acute Rehab PT Goals Patient Stated Goal: to get stronger, get back to PLOF PT Goal Formulation: With patient Time For Goal Achievement: 01/25/21 Potential to Achieve Goals: Good    Frequency Min 2X/week   Barriers to discharge        Co-evaluation               AM-PAC PT "6 Clicks" Mobility  Outcome Measure Help needed turning from your back to your side while in a flat bed without using bedrails?: A Little Help needed moving from lying on your back to sitting on the side of a flat bed without using bedrails?: A Little Help needed moving to and from a bed to a chair (including a wheelchair)?: A Little Help needed standing up from a chair using  your arms (e.g., wheelchair or bedside chair)?: A Little Help needed to walk in hospital room?: A Little Help needed climbing 3-5 steps with a railing? : A Lot 6 Click Score: 17    End of Session Equipment Utilized During Treatment: Gait belt Activity Tolerance: Patient tolerated treatment well Patient left: in chair;with family/visitor present Nurse Communication: Mobility status PT Visit Diagnosis: Difficulty in walking, not elsewhere classified (R26.2);Muscle weakness (generalized) (M62.81);Pain Pain - Right/Left:   (midline) Pain - part of body:  (abdominal pain)    Time: 5500-1642 PT Time Calculation (min) (ACUTE ONLY): 44 min   Charges:   PT Evaluation $PT Eval Low Complexity: 1 Low PT Treatments $Therapeutic Exercise: 23-37 mins $Therapeutic Activity: 8-22 mins      Lieutenant Diego PT, DPT 12:06 PM,01/11/21

## 2021-01-11 NOTE — Progress Notes (Signed)
1 Day Post-Op Procedure(s) (LRB): HYSTERECTOMY ABDOMINAL WITH  BILATERA SALPINGO OOPHORECTOMY (Bilateral) CYSTOSCOPY  Incisional bleeding yesterday . Pressure dressing placed   Subjective: Patient reports pain is improving  + flatus  .    Objective: I have reviewed patient's vital signs, intake and output, medications, and labs.  General: alert and cooperative Resp: clear to auscultation bilaterally GI: soft, non-tender; bowel sounds normal; no masses,  no organomegaly Incision  covered and dry   Assessment: s/p Procedure(s): HYSTERECTOMY ABDOMINAL WITH  BILATERA SALPINGO OOPHORECTOMY (Bilateral) CYSTOSCOPY:  Incisional bleeding - stable now  with pressure dressing  Slight decrease in urine output recently,drop in Hct possible intravascular depletion . Clinicall y stable though  BP normalized on meds DM- glucose levels are improving    Plan: 250 cc fluid challenge- if UO > 30 d/c foley and slow IV rate while she transitions to regular diet  Add back metformin 500 mg bid Cont BP meds  Keep pressure dressing on until tomorrow .  D/P PCA and continue roxicodone for pain relief, stop Toradol and change dosing of Motrin based on increase in Cr on BMP today    LOS: 1 day    Gwen Her Leonette Tischer 01/11/2021, 9:33 AM

## 2021-01-11 NOTE — Progress Notes (Signed)
Reassessing pt dressing. RN noting no drainage. Pt still having tenderness on right side. Area was previously hard to touch and swollen, swelling has decreased and area is more soft to touch. Will continue to monitor.

## 2021-01-12 LAB — CBC
HCT: 29.5 % — ABNORMAL LOW (ref 36.0–46.0)
Hemoglobin: 9.8 g/dL — ABNORMAL LOW (ref 12.0–15.0)
MCH: 29.1 pg (ref 26.0–34.0)
MCHC: 33.2 g/dL (ref 30.0–36.0)
MCV: 87.5 fL (ref 80.0–100.0)
Platelets: 238 10*3/uL (ref 150–400)
RBC: 3.37 MIL/uL — ABNORMAL LOW (ref 3.87–5.11)
RDW: 14.8 % (ref 11.5–15.5)
WBC: 8.6 10*3/uL (ref 4.0–10.5)
nRBC: 0 % (ref 0.0–0.2)

## 2021-01-12 LAB — BASIC METABOLIC PANEL
Anion gap: 6 (ref 5–15)
BUN: 22 mg/dL — ABNORMAL HIGH (ref 6–20)
CO2: 26 mmol/L (ref 22–32)
Calcium: 9.2 mg/dL (ref 8.9–10.3)
Chloride: 104 mmol/L (ref 98–111)
Creatinine, Ser: 0.97 mg/dL (ref 0.44–1.00)
GFR, Estimated: >60 mL/min (ref >60)
Glucose, Bld: 183 mg/dL — ABNORMAL HIGH (ref 70–99)
Potassium: 3.8 mmol/L (ref 3.5–5.1)
Sodium: 136 mmol/L (ref 135–145)

## 2021-01-12 LAB — GLUCOSE, CAPILLARY
Glucose-Capillary: 186 mg/dL — ABNORMAL HIGH (ref 70–99)
Glucose-Capillary: 174 mg/dL — ABNORMAL HIGH (ref 70–99)

## 2021-01-12 MED ORDER — OXYCODONE-ACETAMINOPHEN 5-325 MG PO TABS: 1.0000 | ORAL_TABLET | ORAL | 0 refills | Status: DC | PRN

## 2021-01-12 MED ORDER — OXYCODONE-ACETAMINOPHEN 5-325 MG PO TABS: 1.0000 | ORAL_TABLET | ORAL | Status: DC | PRN

## 2021-01-12 NOTE — Progress Notes (Signed)
2 Days Post-Op Procedure(s) (LRB): HYSTERECTOMY ABDOMINAL WITH  BILATERA SALPINGO OOPHORECTOMY (Bilateral) CYSTOSCOPY POD#2   Tolerating regular diet  + flatus  Subjective: Patient reports incisional pain.  Improving   Objective: I have reviewed patient's vital signs, intake and output, medications, and labs.  General: alert and cooperative Resp: clear to auscultation bilaterally Cardio: systolic murmur: early systolic 2/6, medium pitch at 2nd left intercostal space GI: soft, non-tender; bowel sounds normal; no masses,  no organomegaly Incision : 3 cm opening right lateral side . Wound probed with q tip and dark blood exudes .This area was packed SQ with 1/2 inch  guaze Assessment: s/p Procedure(s): HYSTERECTOMY ABDOMINAL WITH  BILATERA SALPINGO OOPHORECTOMY (Bilateral) CYSTOSCOPY: stable Right lateral wound opening with small hematoma  Plan: Discharge home Wound care discussed with husband  RTC 48 hours for wound care .   LOS: 2 days    Gwen Her Janvi Ammar 01/12/2021, 1:50 PM

## 2021-01-12 NOTE — Discharge Summary (Signed)
Physician Discharge Summary  Patient ID: Claire Hansen MRN: [id number] DOB/AGE: [birth year] 58 y.o.  Admit date: 01/10/2021 Discharge date: 01/12/2021  Admission Diagnoses:symptomatic fibroid uterus   Discharge Diagnoses: s/p Abdominal supracervcal hysterecectomy and BSO   Wound hematoma with wound opening  Principal Problem:   Post-operative state   Discharged Condition: good  Hospital Course: pt underwent the above surgery . Marland Kitchen Post operatively received a morphine PCA for pain control and management  of glucose and BP . POD#1 diet advanced and Foley removed after passing an IV challenge . Ambulation  with PT .  POD#2 wound eval with 3 cm right lateral wound defect and hematoma noted . Wound I+D and packing with 1/2 ' gauze   SEM noted on cardiac exam 2/6 Consults: None  Significant Diagnostic Studies: labs:  Results for orders placed or performed during the hospital encounter of 01/10/21 (from the past 72 hour(s))  Glucose, capillary     Status: Abnormal   Collection Time: 01/10/21  6:14 AM  Result Value Ref Range   Glucose-Capillary 178 (H) 70 - 99 mg/dL    Comment: Glucose reference range applies only to samples taken after fasting for at least 8 hours.  Glucose, capillary     Status: Abnormal   Collection Time: 01/10/21 11:09 AM  Result Value Ref Range   Glucose-Capillary 261 (H) 70 - 99 mg/dL    Comment: Glucose reference range applies only to samples taken after fasting for at least 8 hours.  CBC     Status: Abnormal   Collection Time: 01/10/21  1:22 PM  Result Value Ref Range   WBC 16.2 (H) 4.0 - 10.5 K/uL   RBC 4.65 3.87 - 5.11 MIL/uL   Hemoglobin 13.6 12.0 - 15.0 g/dL   HCT 39.3 36.0 - 46.0 %   MCV 84.5 80.0 - 100.0 fL   MCH 29.2 26.0 - 34.0 pg   MCHC 34.6 30.0 - 36.0 g/dL   RDW 14.3 11.5 - 15.5 %   Platelets 321 150 - 400 K/uL   nRBC 0.0 0.0 - 0.2 %    Comment: Performed at [facility], [address] [place], [place] [address]  Creatinine,  serum     Status: None   Collection Time: 01/10/21  1:22 PM  Result Value Ref Range   Creatinine, Ser 0.85 0.44 - 1.00 mg/dL   GFR, Estimated >60 >60 mL/min    Comment: (NOTE) Calculated using the CKD-EPI Creatinine Equation (2021) Performed at [facility], [address] [place], [place] [address]   Glucose, capillary     Status: Abnormal   Collection Time: 01/10/21  1:32 PM  Result Value Ref Range   Glucose-Capillary 265 (H) 70 - 99 mg/dL    Comment: Glucose reference range applies only to samples taken after fasting for at least 8 hours.  Glucose, capillary     Status: Abnormal   Collection Time: 01/10/21  5:13 PM  Result Value Ref Range   Glucose-Capillary 244 (H) 70 - 99 mg/dL    Comment: Glucose reference range applies only to samples taken after fasting for at least 8 hours.  Glucose, capillary     Status: Abnormal   Collection Time: 01/10/21 11:09 PM  Result Value Ref Range   Glucose-Capillary 195 (H) 70 - 99 mg/dL    Comment: Glucose reference range applies only to samples taken after fasting for at least 8 hours.  CBC     Status: Abnormal   Collection Time: 01/11/21  6:45 AM  Result Value Ref Range   WBC 9.2 4.0 - 10.5 K/uL   RBC 3.70 (L) 3.87 - 5.11 MIL/uL   Hemoglobin 10.5 (L) 12.0 - 15.0 g/dL   HCT 31.6 (L) 36.0 - 46.0 %   MCV 85.4 80.0 - 100.0 fL   MCH 28.4 26.0 - 34.0 pg   MCHC 33.2 30.0 - 36.0 g/dL   RDW 15.0 11.5 - 15.5 %   Platelets 279 150 - 400 K/uL   nRBC 0.0 0.0 - 0.2 %    Comment: Performed at [facility], [address] [place], [place] [address]  Basic metabolic panel     Status: Abnormal   Collection Time: 01/11/21  6:45 AM  Result Value Ref Range   Sodium 137 135 - 145 mmol/L   Potassium 4.2 3.5 - 5.1 mmol/L   Chloride 104 98 - 111 mmol/L   CO2 25 22 - 32 mmol/L   Glucose, Bld 158 (H) 70 - 99 mg/dL    Comment: Glucose reference range applies only to samples taken after fasting for at least 8 hours.   BUN 23 (H) 6 - 20  mg/dL   Creatinine, Ser 1.27 (H) 0.44 - 1.00 mg/dL   Calcium 9.2 8.9 - 10.3 mg/dL   GFR, Estimated 49 (L) >60 mL/min    Comment: (NOTE) Calculated using the CKD-EPI Creatinine Equation (2021)    Anion gap 8 5 - 15    Comment: Performed at [facility], [address] [place], [place] [address]  Glucose, capillary     Status: Abnormal   Collection Time: 01/11/21  8:23 AM  Result Value Ref Range   Glucose-Capillary 157 (H) 70 - 99 mg/dL    Comment: Glucose reference range applies only to samples taken after fasting for at least 8 hours.  Glucose, capillary     Status: Abnormal   Collection Time: 01/11/21 12:09 PM  Result Value Ref Range   Glucose-Capillary 168 (H) 70 - 99 mg/dL    Comment: Glucose reference range applies only to samples taken after fasting for at least 8 hours.  CBC     Status: Abnormal   Collection Time: 01/11/21 12:41 PM  Result Value Ref Range   WBC 8.7 4.0 - 10.5 K/uL   RBC 3.43 (L) 3.87 - 5.11 MIL/uL   Hemoglobin 9.8 (L) 12.0 - 15.0 g/dL   HCT 29.7 (L) 36.0 - 46.0 %   MCV 86.6 80.0 - 100.0 fL   MCH 28.6 26.0 - 34.0 pg   MCHC 33.0 30.0 - 36.0 g/dL   RDW 15.1 11.5 - 15.5 %   Platelets 251 150 - 400 K/uL   nRBC 0.0 0.0 - 0.2 %    Comment: Performed at [facility], [address] [place], [place] [address]  Glucose, capillary     Status: Abnormal   Collection Time: 01/11/21  5:34 PM  Result Value Ref Range   Glucose-Capillary 151 (H) 70 - 99 mg/dL    Comment: Glucose reference range applies only to samples taken after fasting for at least 8 hours.   Comment 1 Notify RN   Glucose, capillary     Status: Abnormal   Collection Time: 01/11/21 10:09 PM  Result Value Ref Range   Glucose-Capillary 183 (H) 70 - 99 mg/dL    Comment: Glucose reference range applies only to samples taken after fasting for at least 8 hours.  CBC     Status: Abnormal   Collection Time: 01/12/21  6:23 AM  Result Value Ref Range   WBC 8.6 4.0 - 10.5 K/uL   RBC  3.37 (L) 3.87 - 5.11 MIL/uL   Hemoglobin 9.8 (L) 12.0 - 15.0 g/dL   HCT 29.5 (L) 36.0 - 46.0 %   MCV 87.5 80.0 - 100.0 fL   MCH 29.1 26.0 - 34.0 pg   MCHC 33.2 30.0 - 36.0 g/dL   RDW 14.8 11.5 - 15.5 %   Platelets 238 150 - 400 K/uL   nRBC 0.0 0.0 - 0.2 %    Comment: Performed at [facility], [address], [place], [place] [address]  Basic metabolic panel     Status: Abnormal   Collection Time: 01/12/21  6:23 AM  Result Value Ref Range   Sodium 136 135 - 145 mmol/L   Potassium 3.8 3.5 - 5.1 mmol/L   Chloride 104 98 - 111 mmol/L   CO2 26 22 - 32 mmol/L   Glucose, Bld 183 (H) 70 - 99 mg/dL    Comment: Glucose reference range applies only to samples taken after fasting for at least 8 hours.   BUN 22 (H) 6 - 20 mg/dL   Creatinine, Ser 0.97 0.44 - 1.00 mg/dL   Calcium 9.2 8.9 - 10.3 mg/dL   GFR, Estimated >60 >60 mL/min    Comment: (NOTE) Calculated using the CKD-EPI Creatinine Equation (2021)    Anion gap 6 5 - 15    Comment: Performed at [facility], [address] [place], [place] [address]  Glucose, capillary     Status: Abnormal   Collection Time: 01/12/21  8:12 AM  Result Value Ref Range   Glucose-Capillary 186 (H) 70 - 99 mg/dL    Comment: Glucose reference range applies only to samples taken after fasting for at least 8 hours.  Glucose, capillary     Status: Abnormal   Collection Time: 01/12/21 12:15 PM  Result Value Ref Range   Glucose-Capillary 174 (H) 70 - 99 mg/dL    Comment: Glucose reference range applies only to samples taken after fasting for at least 8 hours.     Treatments: surgery: as above   Discharge Exam: Blood pressure 136/70, pulse 84, temperature 98.2 F (36.8 C), temperature source Oral, resp. rate 18, height 5\' 5"  (1.651 m), weight 107 kg, last menstrual period 06/04/2016, SpO2 100 %. General appearance: alert and cooperative Resp: clear to auscultation bilaterally Cardio: 2/6 SEM noted  GI: soft, non-tender; bowel sounds  normal; no masses,  no organomegaly Incision/Wound:wound packing as above   Disposition:  D/c home   Allergies as of 01/12/2021       Reactions   Shellfish Allergy Anaphylaxis, Nausea And Vomiting   Topical betadine is OK to use.        Medication List     STOP taking these medications    meloxicam 15 MG tablet Commonly known as: MOBIC   oxyCODONE 5 MG immediate release tablet Commonly known as: Oxy IR/ROXICODONE   traMADol 50 MG tablet Commonly known as: Aram Candela ER 13.5 MG C12a Generic drug: oxyCODONE ER       TAKE these medications    acetaminophen 500 MG tablet Commonly known as: TYLENOL Take 1,000 mg by mouth every 8 (eight) hours as needed for mild pain or headache.   DULoxetine 60 MG capsule Commonly known as: CYMBALTA Take 60 mg by mouth every evening.   famotidine 10 MG tablet Commonly known as: PEPCID Take 10 mg by mouth daily  as needed for heartburn or indigestion.   gabapentin 600 MG tablet Commonly known as: NEURONTIN Take 0.5 tablets (300 mg total) by mouth 3 (three) times daily.   HumaLOG KwikPen 100 UNIT/ML KwikPen Generic drug: insulin lispro Inject 2 Units into the skin 2 (two) times daily as needed for high blood sugar.   hydrALAZINE 10 MG tablet Commonly known as: APRESOLINE Take 10 mg by mouth daily.   hydrocortisone cream 0.5 % Apply 1 application topically daily as needed for itching (Rash).   metFORMIN 500 MG 24 hr tablet Commonly known as: GLUCOPHAGE-XR TAKE 1 TABLET BY MOUTH  DAILY WITH BREAKFAST What changed: when to take this   methocarbamol 750 MG tablet Commonly known as: ROBAXIN Take 750 mg by mouth 4 (four) times daily as needed for muscle spasms.   metoprolol succinate 25 MG 24 hr tablet Commonly known as: TOPROL-XL Take 25 mg by mouth daily.   montelukast 10 MG tablet Commonly known as: SINGULAIR Take 1 tablet (10 mg total) by mouth daily. What changed:  when to take this reasons to take this    multivitamin with minerals Tabs tablet Take 1 tablet by mouth daily.   Olmesartan-amLODIPine-HCTZ 40-10-25 MG Tabs Take 1 tablet by mouth daily.   oxyCODONE-acetaminophen 5-325 MG tablet Commonly known as: Percocet Take 1 tablet by mouth every 4 (four) hours as needed for severe pain or moderate pain.   Rybelsus 7 MG Tabs Generic drug: Semaglutide Take 7 mg by mouth daily.   spironolactone 25 MG tablet Commonly known as: ALDACTONE Take 25 mg by mouth daily.        Follow-up Information     Amori Colomb, Gwen Her, MD Follow up in 2 day(s).   Specialty: Obstetrics and Gynecology Why: wound care Contact information: [address] [place] [address] [phone]                 Signed: Gwen Her Adia Crammer 01/12/2021, 2:12 PM

## 2021-01-12 NOTE — Progress Notes (Signed)
Pt discharged home. Discharge instructions, prescriptions, and follow up appointments given to and reviewed with pt. Pt verbalized understanding. To be escorted by axillary.  

## 2021-01-13 LAB — HEMOGLOBIN A1C
Hgb A1c MFr Bld: 7.6 % — ABNORMAL HIGH (ref 4.8–5.6)
Mean Plasma Glucose: 171 mg/dL

## 2021-01-14 LAB — SURGICAL PATHOLOGY

## 2021-05-29 ENCOUNTER — Other Ambulatory Visit: Payer: Self-pay | Admitting: Family

## 2021-05-29 DIAGNOSIS — Z1231 Encounter for screening mammogram for malignant neoplasm of breast: Secondary | ICD-10-CM

## 2021-06-09 ENCOUNTER — Emergency Department: Payer: Managed Care, Other (non HMO)

## 2021-06-09 ENCOUNTER — Inpatient Hospital Stay
Admission: EM | Admit: 2021-06-09 | Discharge: 2021-06-10 | DRG: 065 | Disposition: A | Discharge status: Home / Self Care | Payer: Managed Care, Other (non HMO) | Attending: Hospitalist | Admitting: Hospitalist

## 2021-06-09 ENCOUNTER — Other Ambulatory Visit: Payer: Self-pay

## 2021-06-09 DIAGNOSIS — Z79899 Other long term (current) drug therapy: Secondary | ICD-10-CM | POA: Diagnosis not present

## 2021-06-09 DIAGNOSIS — E1122 Type 2 diabetes mellitus with diabetic chronic kidney disease: Secondary | ICD-10-CM | POA: Diagnosis present

## 2021-06-09 DIAGNOSIS — Z825 Family history of asthma and other chronic lower respiratory diseases: Secondary | ICD-10-CM

## 2021-06-09 DIAGNOSIS — Z791 Long term (current) use of non-steroidal anti-inflammatories (NSAID): Secondary | ICD-10-CM | POA: Diagnosis not present

## 2021-06-09 DIAGNOSIS — R531 Weakness: Secondary | ICD-10-CM

## 2021-06-09 DIAGNOSIS — J3089 Other allergic rhinitis: Secondary | ICD-10-CM

## 2021-06-09 DIAGNOSIS — Z6841 Body Mass Index (BMI) 40.0 and over, adult: Secondary | ICD-10-CM

## 2021-06-09 DIAGNOSIS — Z794 Long term (current) use of insulin: Secondary | ICD-10-CM

## 2021-06-09 DIAGNOSIS — Z91013 Allergy to seafood: Secondary | ICD-10-CM | POA: Diagnosis not present

## 2021-06-09 DIAGNOSIS — R4781 Slurred speech: Secondary | ICD-10-CM | POA: Diagnosis present

## 2021-06-09 DIAGNOSIS — I10 Essential (primary) hypertension: Secondary | ICD-10-CM | POA: Diagnosis present

## 2021-06-09 DIAGNOSIS — E1129 Type 2 diabetes mellitus with other diabetic kidney complication: Secondary | ICD-10-CM | POA: Diagnosis present

## 2021-06-09 DIAGNOSIS — Z20822 Contact with and (suspected) exposure to covid-19: Secondary | ICD-10-CM | POA: Diagnosis present

## 2021-06-09 DIAGNOSIS — E785 Hyperlipidemia, unspecified: Secondary | ICD-10-CM | POA: Diagnosis present

## 2021-06-09 DIAGNOSIS — G8929 Other chronic pain: Secondary | ICD-10-CM | POA: Diagnosis present

## 2021-06-09 DIAGNOSIS — F419 Anxiety disorder, unspecified: Secondary | ICD-10-CM | POA: Diagnosis present

## 2021-06-09 DIAGNOSIS — R202 Paresthesia of skin: Principal | ICD-10-CM

## 2021-06-09 DIAGNOSIS — Z96643 Presence of artificial hip joint, bilateral: Secondary | ICD-10-CM | POA: Diagnosis present

## 2021-06-09 DIAGNOSIS — G8191 Hemiplegia, unspecified affecting right dominant side: Secondary | ICD-10-CM | POA: Diagnosis present

## 2021-06-09 DIAGNOSIS — I6329 Cerebral infarction due to unspecified occlusion or stenosis of other precerebral arteries: Principal | ICD-10-CM | POA: Diagnosis present

## 2021-06-09 DIAGNOSIS — I639 Cerebral infarction, unspecified: Secondary | ICD-10-CM | POA: Diagnosis not present

## 2021-06-09 DIAGNOSIS — I6621 Occlusion and stenosis of right posterior cerebral artery: Secondary | ICD-10-CM | POA: Diagnosis present

## 2021-06-09 DIAGNOSIS — R29704 NIHSS score 4: Secondary | ICD-10-CM | POA: Diagnosis present

## 2021-06-09 DIAGNOSIS — I1A0 Resistant hypertension: Secondary | ICD-10-CM | POA: Diagnosis present

## 2021-06-09 DIAGNOSIS — E1165 Type 2 diabetes mellitus with hyperglycemia: Secondary | ICD-10-CM | POA: Diagnosis present

## 2021-06-09 HISTORY — DX: Cerebral infarction, unspecified: I63.9

## 2021-06-09 LAB — RESP PANEL BY RT-PCR (FLU A&B, COVID) ARPGX2
Influenza A by PCR: NEGATIVE
Influenza B by PCR: NEGATIVE
SARS Coronavirus 2 by RT PCR: NEGATIVE

## 2021-06-09 LAB — CBC WITH DIFFERENTIAL/PLATELET
Abs Immature Granulocytes: 0.03 10*3/uL (ref 0.00–0.07)
Basophils Absolute: 0 10*3/uL (ref 0.0–0.1)
Basophils Relative: 0 %
Eosinophils Absolute: 0.4 10*3/uL (ref 0.0–0.5)
Eosinophils Relative: 4 %
HCT: 41 % (ref 36.0–46.0)
Hemoglobin: 13.7 g/dL (ref 12.0–15.0)
Immature Granulocytes: 0 %
Lymphocytes Relative: 25 %
Lymphs Abs: 2.4 10*3/uL (ref 0.7–4.0)
MCH: 27.4 pg (ref 26.0–34.0)
MCHC: 33.4 g/dL (ref 30.0–36.0)
MCV: 82 fL (ref 80.0–100.0)
Monocytes Absolute: 0.5 10*3/uL (ref 0.1–1.0)
Monocytes Relative: 5 %
Neutro Abs: 6.3 10*3/uL (ref 1.7–7.7)
Neutrophils Relative %: 66 %
Platelets: 305 10*3/uL (ref 150–400)
RBC: 5 MIL/uL (ref 3.87–5.11)
RDW: 15 % (ref 11.5–15.5)
WBC: 9.7 10*3/uL (ref 4.0–10.5)
nRBC: 0 % (ref 0.0–0.2)

## 2021-06-09 LAB — URINE DRUG SCREEN, QUALITATIVE (ARMC ONLY)
Amphetamines, Ur Screen: NOT DETECTED
Barbiturates, Ur Screen: NOT DETECTED
Benzodiazepine, Ur Scrn: NOT DETECTED
Cannabinoid 50 Ng, Ur ~~LOC~~: NOT DETECTED
Cocaine Metabolite,Ur ~~LOC~~: NOT DETECTED
MDMA (Ecstasy)Ur Screen: NOT DETECTED
Methadone Scn, Ur: NOT DETECTED
Opiate, Ur Screen: NOT DETECTED
Phencyclidine (PCP) Ur S: NOT DETECTED
Tricyclic, Ur Screen: NOT DETECTED

## 2021-06-09 LAB — COMPREHENSIVE METABOLIC PANEL
ALT: 17 U/L (ref 0–44)
AST: 20 U/L (ref 15–41)
Albumin: 4.5 g/dL (ref 3.5–5.0)
Alkaline Phosphatase: 101 U/L (ref 38–126)
Anion gap: 11 (ref 5–15)
BUN: 15 mg/dL (ref 6–20)
CO2: 22 mmol/L (ref 22–32)
Calcium: 10.3 mg/dL (ref 8.9–10.3)
Chloride: 106 mmol/L (ref 98–111)
Creatinine, Ser: 0.88 mg/dL (ref 0.44–1.00)
GFR, Estimated: >60 mL/min (ref >60)
Glucose, Bld: 139 mg/dL — ABNORMAL HIGH (ref 70–99)
Potassium: 3.7 mmol/L (ref 3.5–5.1)
Sodium: 139 mmol/L (ref 135–145)
Total Bilirubin: 0.6 mg/dL (ref 0.3–1.2)
Total Protein: 8.2 g/dL — ABNORMAL HIGH (ref 6.5–8.1)

## 2021-06-09 LAB — GLUCOSE, CAPILLARY: Glucose-Capillary: 175 mg/dL — ABNORMAL HIGH (ref 70–99)

## 2021-06-09 LAB — URINALYSIS, ROUTINE W REFLEX MICROSCOPIC
Bilirubin Urine: NEGATIVE
Glucose, UA: NEGATIVE mg/dL
Hgb urine dipstick: NEGATIVE
Ketones, ur: NEGATIVE mg/dL
Leukocytes,Ua: NEGATIVE
Nitrite: NEGATIVE
Protein, ur: NEGATIVE mg/dL
Specific Gravity, Urine: 1.01 (ref 1.005–1.030)
pH: 5 (ref 5.0–8.0)

## 2021-06-09 LAB — CBG MONITORING, ED: Glucose-Capillary: 151 mg/dL — ABNORMAL HIGH (ref 70–99)

## 2021-06-09 LAB — ETHANOL: Alcohol, Ethyl (B): <10 mg/dL (ref <10)

## 2021-06-09 LAB — PROTIME-INR
INR: 1 (ref 0.8–1.2)
Prothrombin Time: 13.4 seconds (ref 11.4–15.2)

## 2021-06-09 LAB — APTT: aPTT: 30 seconds (ref 24–36)

## 2021-06-09 LAB — TROPONIN I (HIGH SENSITIVITY)
Troponin I (High Sensitivity): 19 ng/L — ABNORMAL HIGH (ref <18)
Troponin I (High Sensitivity): 22 ng/L — ABNORMAL HIGH (ref <18)

## 2021-06-09 MED ORDER — LABETALOL HCL 5 MG/ML IV SOLN
20.0000 mg | INTRAVENOUS | Status: DC | PRN
Start: 2021-06-09 — End: 2021-06-10

## 2021-06-09 MED ORDER — ENOXAPARIN SODIUM 60 MG/0.6ML IJ SOSY
0.5000 mg/kg | PREFILLED_SYRINGE | INTRAMUSCULAR | Status: DC
  Administered 2021-06-09: 52.5 mg via SUBCUTANEOUS
  Filled 2021-06-09: qty 0.6

## 2021-06-09 MED ORDER — CLOPIDOGREL BISULFATE 75 MG PO TABS
300.0000 mg | ORAL_TABLET | Freq: Once | ORAL | Status: AC
  Administered 2021-06-09: 300 mg via ORAL
  Filled 2021-06-09: qty 4

## 2021-06-09 MED ORDER — ASPIRIN 325 MG PO TABS
325.0000 mg | ORAL_TABLET | Freq: Once | ORAL | Status: AC
  Administered 2021-06-09: 325 mg via ORAL
  Filled 2021-06-09 (×2): qty 1

## 2021-06-09 MED ORDER — AMLODIPINE BESYLATE 5 MG PO TABS
10.0000 mg | ORAL_TABLET | Freq: Once | ORAL | Status: AC
  Administered 2021-06-09: 10 mg via ORAL
  Filled 2021-06-09: qty 2

## 2021-06-09 MED ORDER — ATORVASTATIN CALCIUM 20 MG PO TABS
80.0000 mg | ORAL_TABLET | Freq: Every day | ORAL | Status: DC
  Administered 2021-06-09 – 2021-06-10 (×2): 80 mg via ORAL
  Filled 2021-06-09 (×2): qty 4

## 2021-06-09 MED ORDER — GABAPENTIN 300 MG PO CAPS
300.0000 mg | ORAL_CAPSULE | Freq: Every morning | ORAL | Status: DC
Start: 2021-06-10 — End: 2021-06-10
  Administered 2021-06-10: 300 mg via ORAL
  Filled 2021-06-09: qty 1

## 2021-06-09 MED ORDER — GABAPENTIN 300 MG PO CAPS
300.0000 mg | ORAL_CAPSULE | Freq: Two times a day (BID) | ORAL | Status: DC
  Administered 2021-06-09: 300 mg via ORAL
  Filled 2021-06-09: qty 1

## 2021-06-09 MED ORDER — IRBESARTAN 150 MG PO TABS
300.0000 mg | ORAL_TABLET | Freq: Every day | ORAL | Status: DC
  Administered 2021-06-09: 300 mg via ORAL
  Filled 2021-06-09: qty 2

## 2021-06-09 MED ORDER — ASPIRIN EC 81 MG PO TBEC
81.0000 mg | DELAYED_RELEASE_TABLET | Freq: Every day | ORAL | Status: DC
  Administered 2021-06-10: 81 mg via ORAL
  Filled 2021-06-09: qty 1

## 2021-06-09 MED ORDER — HYDROCHLOROTHIAZIDE 25 MG PO TABS
25.0000 mg | ORAL_TABLET | Freq: Every day | ORAL | Status: DC
  Administered 2021-06-09: 25 mg via ORAL
  Filled 2021-06-09: qty 1

## 2021-06-09 MED ORDER — CLOPIDOGREL BISULFATE 75 MG PO TABS
75.0000 mg | ORAL_TABLET | Freq: Every day | ORAL | Status: DC
Start: 2021-06-10 — End: 2021-06-10
  Administered 2021-06-10: 75 mg via ORAL
  Filled 2021-06-09: qty 1

## 2021-06-09 MED ORDER — INSULIN ASPART 100 UNIT/ML IJ SOLN
0.0000 [IU] | Freq: Three times a day (TID) | INTRAMUSCULAR | Status: DC
  Administered 2021-06-10 (×2): 3 [IU] via SUBCUTANEOUS
  Filled 2021-06-09 (×2): qty 1

## 2021-06-09 MED ORDER — ACETAMINOPHEN 325 MG PO TABS: 650.0000 mg | ORAL_TABLET | ORAL | Status: DC | PRN

## 2021-06-09 MED ORDER — ACETAMINOPHEN 650 MG RE SUPP: 650.0000 mg | RECTAL | Status: DC | PRN

## 2021-06-09 MED ORDER — GABAPENTIN 300 MG PO CAPS
600.0000 mg | ORAL_CAPSULE | Freq: Every day | ORAL | Status: DC
Start: 2021-06-10 — End: 2021-06-10

## 2021-06-09 MED ORDER — SPIRONOLACTONE 25 MG PO TABS: 25.0000 mg | ORAL_TABLET | Freq: Every day | ORAL | Status: DC

## 2021-06-09 MED ORDER — INSULIN ASPART 100 UNIT/ML IJ SOLN: 0.0000 [IU] | Freq: Every day | INTRAMUSCULAR | Status: DC

## 2021-06-09 MED ORDER — METOPROLOL SUCCINATE ER 25 MG PO TB24
25.0000 mg | ORAL_TABLET | Freq: Every day | ORAL | Status: DC
  Administered 2021-06-10: 25 mg via ORAL
  Filled 2021-06-09: qty 1

## 2021-06-09 MED ORDER — METOPROLOL TARTRATE 25 MG PO TABS
25.0000 mg | ORAL_TABLET | Freq: Once | ORAL | Status: AC
  Administered 2021-06-09: 25 mg via ORAL
  Filled 2021-06-09: qty 1

## 2021-06-09 MED ORDER — MELATONIN 5 MG PO TABS
2.5000 mg | ORAL_TABLET | Freq: Every evening | ORAL | Status: DC | PRN
  Administered 2021-06-09: 2.5 mg via ORAL
  Filled 2021-06-09: qty 1

## 2021-06-09 MED ORDER — ACETAMINOPHEN 160 MG/5ML PO SOLN
650.0000 mg | ORAL | Status: DC | PRN
  Filled 2021-06-09: qty 20.3

## 2021-06-09 MED ORDER — LABETALOL HCL 5 MG/ML IV SOLN
10.0000 mg | Freq: Once | INTRAVENOUS | Status: AC
  Administered 2021-06-09: 10 mg via INTRAVENOUS
  Filled 2021-06-09: qty 4

## 2021-06-09 MED ORDER — GADOBUTROL 1 MMOL/ML IV SOLN
10.0000 mL | Freq: Once | INTRAVENOUS | Status: AC | PRN
  Administered 2021-06-09: 10 mL via INTRAVENOUS

## 2021-06-09 MED ORDER — STROKE: EARLY STAGES OF RECOVERY BOOK: Freq: Once | Status: AC

## 2021-06-09 MED ORDER — GABAPENTIN 300 MG PO CAPS
300.0000 mg | ORAL_CAPSULE | Freq: Once | ORAL | Status: AC
  Administered 2021-06-09: 300 mg via ORAL
  Filled 2021-06-09: qty 1

## 2021-06-09 NOTE — ED Provider Notes (Signed)
? ?  I assumed care of this patient at approximately 1500.  Please see outgoing providers note for full details regarding patient's initial evaluation and assessment.  In brief patient presents for evaluation of paresthesias in her right hand as well as some weakness of her right leg and slurred speech.  The symptoms started the evening of 5/13.  Her speech is improved and she did not notice the weakness in her right leg until she woke up yesterday morning.  She states the paresthesias are unchanged.  Has had any new symptoms or change in her symptoms in the last 24 hours.  She is endorsing some intermittent fluttering in her heart and soreness but no other acute sick symptoms.  She had a CT head that was negative as well as troponin is stable minimally elevated prior to signout.  She is pending MRI.  ? 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  Also reviewed radiologist interpretation and agree with the findings of same addition to occluded left V4 segment and focal severe stenosis of the distal right P2 segment.  I recommend CTA of the neck.  Venous contamination with concern for possible occlusion of V4.  There is also a thyroid nodule. ? ?Gust with patient concern for acute stroke as a cause of her symptoms.  She is willing to be admitted.  I will order some labetalol as her blood pressure is 201/112.  I will admit to medicine service for further evaluation and management.  She is outside of any intervention window at this point. ? ?I also notified neurologist Dr. Leonel Ramsay.  ? ? ?Past Medical History:  ?Diagnosis Date  ? Allergy   ? Anxiety   ? Asthma   ? pt denies having asthma  ? Chronic osteoarthritis   ? knees  ? Deviated septum   ? Diabetes mellitus without complication ([place])   ? Hyperlipidemia   ? Hypertension   ? Irregular menstrual cycle   ? Lymphadenopathy   ? Obesity   ? Peri-menopause   ? Pneumonia 2015  ? ? ?  ?Lucrezia Starch, MD ?06/09/21 1809 ? ?

## 2021-06-09 NOTE — H&P (Signed)
?History and Physical  ? ? ?Claire Hansen OIN:[id number] DOB: 1960-04-17 DOA: 06/09/2021 ? ?PCP: Perrin Maltese, MD  ?Patient coming from: home ? ? ?Chief Complaint: right sided weakness ? ?HPI: Claire Hansen is a 61 y.o. female with medical history significant for morbid obesity, htn, dm, who presents with the above. ? ?Symptoms began 2 days ago. Began with an odd sensation right hand, then weakness/numbness the right hand. Then yesterday weakness the entire right side of the body with some mild difficulty speaking. The speaking difficulty and weakness on the right side has improved but still decreased sensation right hand. No trouble swallowing. No headache. No recent med changes. No prior history of stroke. Denies history a fib. ? ?ED Course:  ? ?MRI shows acute pontine stroke ? ?Review of Systems: As per HPI otherwise 10 point review of systems negative.  ? ? ?Past Medical History:  ?Diagnosis Date  ? Allergy   ? Anxiety   ? Asthma   ? pt denies having asthma  ? Chronic osteoarthritis   ? knees  ? Deviated septum   ? Diabetes mellitus without complication ([place])   ? Hyperlipidemia   ? Hypertension   ? Irregular menstrual cycle   ? Lymphadenopathy   ? Obesity   ? Peri-menopause   ? Pneumonia 2015  ? ? ?Past Surgical History:  ?Procedure Laterality Date  ? BACK SURGERY  07/2020  ? BREAST SURGERY Right   ? Cyst removed  ? CESAREAN SECTION    ? x2  ? CHOLECYSTECTOMY    ? CYSTOSCOPY  01/10/2021  ? Procedure: CYSTOSCOPY;  Surgeon: Schermerhorn, Gwen Her, MD;  Location: [facility] ORS;  Service: Gynecology;;  ? DILATION AND CURETTAGE OF UTERUS    ? Treatment of Metorrhagia and Endometrial Ablation  ? PERONEAL NERVE DECOMPRESSION Left 04/29/2020  ? Procedure: PERONEAL NERVE DECOMPRESSION;  Surgeon: Deetta Perla, MD;  Location: [facility] OR[facility];  Service: Neurosurgery;  Laterality: Left;  ? SUPRACERVICAL ABDOMINAL HYSTERECTOMY Bilateral 01/10/2021  ? Procedure: HYSTERECTOMY ABDOMINAL WITH  BILATERA SALPINGO OOPHORECTOMY;  Surgeon:  Schermerhorn, Gwen Her, MD;  Location: [facility] OR[facility];  Service: Gynecology;  Laterality: Bilateral;  ? TONSILLECTOMY    ? TOTAL HIP ARTHROPLASTY Right 11/05/2016  ? Procedure: TOTAL HIP ARTHROPLASTY ANTERIOR APPROACH;  Surgeon: Hessie Knows, MD;  Location: [facility] OR[facility];  Service: Orthopedics;  Laterality: Right;  ? TOTAL HIP ARTHROPLASTY Left 07/01/2017  ? Procedure: TOTAL HIP ARTHROPLASTY ANTERIOR APPROACH;  Surgeon: Hessie Knows, MD;  Location: [facility] OR[facility];  Service: Orthopedics;  Laterality: Left;  ? TOTAL HIP REVISION Left 05/03/2020  ? Procedure: LEFT FEMORAL COMPONENT REVISION;  Surgeon: Hessie Knows, MD;  Location: [facility] OR[facility];  Service: Orthopedics;  Laterality: Left;  ? ? ? reports that she has never smoked. She has never used smokeless tobacco. She reports that she does not drink alcohol and does not use drugs. ? ?Allergies  ?Allergen Reactions  ? Shellfish Allergy Anaphylaxis and Nausea And Vomiting  ?  Topical betadine is OK to use.  ? ? ?Family History  ?Problem Relation Age of Onset  ? Asthma Son   ? ? ?Prior to Admission medications   ?Medication Sig Start Date End Date Taking? Authorizing Provider  ?acetaminophen (TYLENOL) 500 MG tablet Take 1,000 mg by mouth every 8 (eight) hours as needed for mild pain or headache. 05/29/13  Yes [provider]  ?famotidine (PEPCID) 10 MG tablet Take 10 mg by mouth daily as needed for heartburn or indigestion.   Yes [provider]  ?  gabapentin (NEURONTIN) 300 MG capsule Take 300 mg by mouth. '300mg'$  qam and '600mg'$  qhs 05/26/21  Yes [provider]  ?HUMALOG KWIKPEN 100 UNIT/ML KwikPen Inject 2 Units into the skin 2 (two) times daily as needed for high blood sugar. 09/03/20  Yes [provider]  ?hydrALAZINE (APRESOLINE) 10 MG tablet Take 10 mg by mouth daily.   Yes [provider]  ?ibuprofen (ADVIL) 800 MG tablet Take 800 mg by mouth every 8 (eight) hours as needed. 01/22/21  Yes [provider]  ?meloxicam (MOBIC) 15 MG tablet  Take 15 mg by mouth daily. 05/27/21  Yes [provider]  ?metFORMIN (GLUCOPHAGE-XR) 500 MG 24 hr tablet TAKE 1 TABLET BY MOUTH  DAILY WITH BREAKFAST ?Patient taking differently: Take 500 mg by mouth 2 (two) times daily. 04/10/16  Yes Coral Spikes, DO  ?methocarbamol (ROBAXIN) 750 MG tablet Take 750 mg by mouth 4 (four) times daily as needed for muscle spasms.   Yes [provider]  ?metoprolol succinate (TOPROL-XL) 25 MG 24 hr tablet Take 25 mg by mouth daily.   Yes [provider]  ?montelukast (SINGULAIR) 10 MG tablet Take 1 tablet (10 mg total) by mouth daily. ?Patient taking differently: Take 10 mg by mouth daily as needed (allergies). 02/21/16  Yes Coral Spikes, DO  ?Multiple Vitamin (MULTIVITAMIN WITH MINERALS) TABS tablet Take 1 tablet by mouth daily.   Yes [provider]  ?Olmesartan-Amlodipine-HCTZ 40-10-25 MG TABS Take 1 tablet by mouth daily. 02/21/16  Yes Cook, Michael Boston G, DO  ?rosuvastatin (CRESTOR) 10 MG tablet Take 10 mg by mouth at bedtime. 05/26/21  Yes [provider]  ?Semaglutide (RYBELSUS) 7 MG TABS Take 7 mg by mouth daily.   Yes [provider]  ?spironolactone (ALDACTONE) 25 MG tablet Take 25 mg by mouth daily. 02/16/20  Yes [provider]  ?DULoxetine (CYMBALTA) 60 MG capsule Take 60 mg by mouth every evening. ?Patient not taking: Reported on 06/09/2021    [provider]  ?gabapentin (NEURONTIN) 600 MG tablet Take 0.5 tablets (300 mg total) by mouth 3 (three) times daily. ?Patient not taking: Reported on 06/09/2021 03/24/20   Shawna Clamp, MD  ?hydrocortisone cream 0.5 % Apply 1 application topically daily as needed for itching (Rash).    [provider]  ?oxyCODONE (OXY IR/ROXICODONE) 5 MG immediate release tablet Take 5 mg by mouth 2 (two) times daily as needed. ?Patient not taking: Reported on 06/09/2021 02/05/21   [provider]  ?oxyCODONE-acetaminophen (PERCOCET) 5-325 MG tablet Take 1 tablet by mouth  every 4 (four) hours as needed for severe pain or moderate pain. ?Patient not taking: Reported on 06/09/2021 01/12/21 01/12/22  Schermerhorn, Gwen Her, MD  ?traMADol (ULTRAM) 50 MG tablet Take 50 mg by mouth every 8 (eight) hours as needed. ?Patient not taking: Reported on 06/09/2021 03/29/21   [provider]  ? ? ?Physical Exam: ?Vitals:  ? 06/09/21 1400 06/09/21 1430 06/09/21 1500 06/09/21 1737  ?BP: (!) 192/93 (!) 135/104 (!) 190/94 (!) 201/112  ?Pulse: 68 72  72  ?Resp: 19 18 (!) 22 (!) 22  ?Temp:      ?TempSrc:      ?SpO2: 99% 98%  98%  ?Weight:      ?Height:      ? ? ?Constitutional: No acute distress ?Head: Atraumatic ?Eyes: Conjunctiva clear ?ENM: Moist mucous membranes. Normal dentition.  ?Neck: Supple ?Respiratory: Clear to auscultation bilaterally, no wheezing/rales/rhonchi. Normal respiratory effort. No accessory muscle use. Marland Kitchen ?Cardiovascular:  Regular rate and rhythm. No murmurs/rubs/gallops. ?Abdomen: Non-tender, non-distended. No masses. No rebound or guarding. Positive bowel sounds. ?Musculoskeletal: No joint deformity upper and lower extremities. Normal ROM, no contractures. Normal muscle tone.  ?Skin: No rashes, lesions, or ulcers.  ?Extremities: No peripheral edema. Palpable peripheral pulses. ?Neurologic: Alert, moving all 4 extremities. Cn 2-12 grossly intact. 5/5 upper strength. Decreased plantarflexion b/l lower extremities ?Psychiatric: Normal insight and judgement. ? ? ?Labs on Admission: I have personally reviewed following labs and imaging studies ? ?CBC: ?Recent Labs  ?Lab 06/09/21 ?1210  ?WBC 9.7  ?NEUTROABS 6.3  ?HGB 13.7  ?HCT 41.0  ?MCV 82.0  ?PLT 305  ? ?Basic Metabolic Panel: ?Recent Labs  ?Lab 06/09/21 ?1210  ?NA 139  ?K 3.7  ?CL 106  ?CO2 22  ?GLUCOSE 139*  ?BUN 15  ?CREATININE 0.88  ?CALCIUM 10.3  ? ?GFR: ?Estimated Creatinine Clearance: 85 mL/min (by C-G formula based on SCr of 0.88 mg/dL). ?Liver Function Tests: ?Recent Labs  ?Lab 06/09/21 ?1210  ?AST 20  ?ALT 17  ?ALKPHOS  101  ?BILITOT 0.6  ?PROT 8.2*  ?ALBUMIN 4.5  ? ?No results for input(s): LIPASE, AMYLASE in the last 168 hours. ?No results for input(s): AMMONIA in the last 168 hours. ?Coagulation Profile: ?Recent Labs

## 2021-06-09 NOTE — ED Provider Triage Note (Signed)
Emergency Medicine Provider Triage Evaluation Note ? ?Claire Hansen , a 61 y.o. female  was evaluated in triage.  Pt complains of strokelike symptoms that started yesterday.  Patient had numbness and tingling in the right arm, some slurred speech.  Husband states the slurred speech is better.  Patient has history of diabetes, hypertension, and hyperlipidemia. ? ?Review of Systems  ?Positive: Strokelike symptoms ?Negative: Fever ? ?Physical Exam  ?LMP 06/04/2016  ?Gen:   Awake, no distress   ?Resp:  Normal effort  ?MSK:   Moves extremities without difficulty  ?Other:  Patient does not appear to have slurred speech presently ? ?Medical Decision Making  ?Medically screening exam initiated at 12:05 PM.  Appropriate orders placed.  Claire Hansen was informed that the remainder of the evaluation will be completed by another provider, this initial triage assessment does not replace that evaluation, and the importance of remaining in the ED until their evaluation is complete. ? ?Concerns for stroke/TIA, protocols started ?  ?Versie Starks, PA-C ?06/09/21 1207 ? ?

## 2021-06-09 NOTE — ED Provider Notes (Addendum)
? ?[facility] ?Provider Note ? ? ? Event Date/Time  ? First MD Initiated Contact with Patient 06/09/21 1418   ?  (approximate) ? ? ?History  ? ?Numbness ? ? ?HPI ? ?Claire Hansen is a 61 y.o. female reports that on sat night her r hand began to feel tingly  in the index and long fingers. This spread to the whole hand. Her speech then got slurry, this has improved but is not back to baseline. Yesterday her r leg got weak. It is still weak.  Patient also reports that she has intermittent  heart fluttering ? ?Physical Exam  ? ?Triage Vital Signs: ?ED Triage Vitals  ?Enc Vitals Group  ?   BP 06/09/21 1204 (!) 206/118  ?   Pulse Rate 06/09/21 1204 90  ?   Resp 06/09/21 1204 18  ?   Temp 06/09/21 1204 98.5 ?F (36.9 ?C)  ?   Temp Source 06/09/21 1204 Oral  ?   SpO2 06/09/21 1204 95 %  ?   Weight 06/09/21 1205 235 lb (106.6 kg)  ?   Height 06/09/21 1205 '5\' 6"'$  (1.676 m)  ?   Head Circumference --   ?   Peak Flow --   ?   Pain Score 06/09/21 1205 0  ?   Pain Loc --   ?   Pain Edu? --   ?   Excl. in [place]? --   ? ? ?Most recent vital signs: ?Vitals:  ? 06/09/21 1430 06/09/21 1500  ?BP: (!) 135/104 (!) 190/94  ?Pulse: 72   ?Resp: 18 (!) 22  ?Temp:    ?SpO2: 98%   ? ?General: Awake, no distress.  ?Head normocephalic atraumatic ?Eyes pupils equal round reactive extraocular movements intact ?CV:  Good peripheral perfusion.  Heart regular rate and rhythm no audible murmurs ?Resp:  Normal effort.  Lungs are clear ?Abd:  No distention.  Soft and nontender ?Extremities slight trace edema ?Neuro cranial nerves II through XII appear to be intact although her speech is not clear.  Finger-nose and rapid alternating movements in the hands are normal.  Patient reports the numbness and tingling of the right hand none elsewhere.  Her right leg is weaker than usual and she is unable to lift it up off the bed.  Left leg is also somewhat weak but that is baseline.  She has bilateral foot drop.  No weakness in the upper  extremities.  No numbness in the rest of the body. ? ? ?ED Results / Procedures / Treatments  ? ?Labs ?(all labs ordered are listed, but only abnormal results are displayed) ?Labs Reviewed  ?COMPREHENSIVE METABOLIC PANEL - Abnormal; Notable for the following components:  ?    Result Value  ? Glucose, Bld 139 (*)   ? Total Protein 8.2 (*)   ? All other components within normal limits  ?CBG MONITORING, ED - Abnormal; Notable for the following components:  ? Glucose-Capillary 151 (*)   ? All other components within normal limits  ?TROPONIN I (HIGH SENSITIVITY) - Abnormal; Notable for the following components:  ? Troponin I (High Sensitivity) 19 (*)   ? All other components within normal limits  ?CBC WITH DIFFERENTIAL/PLATELET  ?TROPONIN I (HIGH SENSITIVITY)  ? ? ? ?EKG ? ?EKG read interpreted by me shows normal sinus rhythm at 99 normal axis no acute disease ? ? ?RADIOLOGY ?CT of the head read by radiology and interpreted by me shows no acute disease ? ? ?PROCEDURES: ? ?  Critical Care performed:  ? ?Procedures ? ? ?MEDICATIONS ORDERED IN ED: ?Medications  ?hydrochlorothiazide (HYDRODIURIL) tablet 25 mg (25 mg Oral Given 06/09/21 1222)  ?irbesartan (AVAPRO) tablet 300 mg (300 mg Oral Given 06/09/21 1240)  ?metoprolol tartrate (LOPRESSOR) tablet 25 mg (25 mg Oral Given 06/09/21 1222)  ?amLODipine (NORVASC) tablet 10 mg (10 mg Oral Given 06/09/21 1221)  ? ? ? ?IMPRESSION / MDM / ASSESSMENT AND PLAN / ED COURSE  ?I reviewed the triage vital signs and the nursing notes. ?Patient's symptoms are consistent with TIA/stroke.  Patient has a fluttering sensation in her heart which could be intermittent A-fib.  Patient will need to follow-up with cardiology after stroke as needed patient evaluated. ? ?Other diagnoses which could account for her symptoms would be a mononeuritis multiplex from her diabetes or multiple sclerosis or multiple TIAs or pinched nerves ? ?The patient is on the cardiac monitor to evaluate for evidence of  arrhythmia and/or significant heart rate changes.  None have been seen at this time ? ?If the MRI is negative possibly patient could go be followed up outpatient by neurology and cardiology primary care is if it is positive she will need to come in and ?----------------------------------------- ?4:08 PM on 06/09/2021 ?----------------------------------------- ?Patient is just now getting to an MRI.  I will sign the patient out to oncoming provider. ? ?FINAL CLINICAL IMPRESSION(S) / ED DIAGNOSES  ? ?Final diagnoses:  ?Paresthesia  ?Weakness  ? ? ? ?Rx / DC Orders  ? ?ED Discharge Orders   ? ? None  ? ?  ? ? ? ?Note:  This document was prepared using Dragon voice recognition software and may include unintentional dictation errors. ?  ?Nena Polio, MD ?06/09/21 1525 ? ?  ?Nena Polio, MD ?06/09/21 1608 ? ?

## 2021-06-09 NOTE — ED Notes (Signed)
Pt to ED states that on Saturday night she noticed numbness in the second and third digit, when she woke up yesterday morning she had numbness and weakness in her right hand and leg. Pt states that she was also slurring her words yesterday. Pt is in NAD.  ?

## 2021-06-09 NOTE — ED Notes (Signed)
Pt in mri 

## 2021-06-09 NOTE — ED Triage Notes (Signed)
Pt here with numbness and weakness. Pt states 2 fingers on her right hand began feeling numb Sat night. Pt then states she began to feel numb and weak on Sunday morning with slurring words. Pt has extensive medical hx.  ?

## 2021-06-10 DIAGNOSIS — I639 Cerebral infarction, unspecified: Secondary | ICD-10-CM | POA: Diagnosis not present

## 2021-06-10 LAB — HEMOGLOBIN A1C
Hgb A1c MFr Bld: 7 % — ABNORMAL HIGH (ref 4.8–5.6)
Mean Plasma Glucose: 154.2 mg/dL

## 2021-06-10 LAB — LIPID PANEL
Cholesterol: 229 mg/dL — ABNORMAL HIGH (ref 0–200)
HDL: 36 mg/dL — ABNORMAL LOW (ref >40)
LDL Cholesterol: 130 mg/dL — ABNORMAL HIGH (ref 0–99)
Total CHOL/HDL Ratio: 6.4 RATIO
Triglycerides: 313 mg/dL — ABNORMAL HIGH (ref <150)
VLDL: 63 mg/dL — ABNORMAL HIGH (ref 0–40)

## 2021-06-10 LAB — GLUCOSE, CAPILLARY
Glucose-Capillary: 186 mg/dL — ABNORMAL HIGH (ref 70–99)
Glucose-Capillary: 175 mg/dL — ABNORMAL HIGH (ref 70–99)

## 2021-06-10 LAB — HIV ANTIBODY (ROUTINE TESTING W REFLEX): HIV Screen 4th Generation wRfx: NONREACTIVE

## 2021-06-10 MED ORDER — ATORVASTATIN CALCIUM 80 MG PO TABS: 80.0000 mg | ORAL_TABLET | Freq: Every day | ORAL | 2 refills | Status: DC

## 2021-06-10 MED ORDER — AMLODIPINE BESYLATE 10 MG PO TABS
10.0000 mg | ORAL_TABLET | Freq: Every day | ORAL | Status: DC
  Administered 2021-06-10: 10 mg via ORAL
  Filled 2021-06-10: qty 1

## 2021-06-10 MED ORDER — ASPIRIN 81 MG PO TBEC: 81.0000 mg | DELAYED_RELEASE_TABLET | Freq: Every day | ORAL | Status: AC

## 2021-06-10 MED ORDER — CLOPIDOGREL BISULFATE 75 MG PO TABS: 75.0000 mg | ORAL_TABLET | Freq: Every day | ORAL | 0 refills | Status: AC

## 2021-06-10 MED ORDER — OLMESARTAN-AMLODIPINE-HCTZ 40-10-25 MG PO TABS: 1.0000 | ORAL_TABLET | Freq: Every day | ORAL | Status: DC

## 2021-06-10 MED ORDER — MELOXICAM 15 MG PO TABS
15.0000 mg | ORAL_TABLET | Freq: Every day | ORAL | Status: DC | PRN
Start: 2021-06-10 — End: 2022-03-04

## 2021-06-10 MED ORDER — HYDROCHLOROTHIAZIDE 25 MG PO TABS
25.0000 mg | ORAL_TABLET | Freq: Every day | ORAL | Status: DC
  Administered 2021-06-10: 25 mg via ORAL
  Filled 2021-06-10: qty 1

## 2021-06-10 MED ORDER — IRBESARTAN 150 MG PO TABS
300.0000 mg | ORAL_TABLET | Freq: Every day | ORAL | Status: DC
Start: 2021-06-10 — End: 2021-06-10
  Administered 2021-06-10: 300 mg via ORAL
  Filled 2021-06-10: qty 2

## 2021-06-10 MED ORDER — METFORMIN HCL ER 500 MG PO TB24: 500.0000 mg | ORAL_TABLET | Freq: Two times a day (BID) | ORAL | Status: DC

## 2021-06-10 MED ORDER — MONTELUKAST SODIUM 10 MG PO TABS: 10.0000 mg | ORAL_TABLET | Freq: Every day | ORAL | Status: DC | PRN

## 2021-06-10 MED ORDER — SPIRONOLACTONE 25 MG PO TABS
25.0000 mg | ORAL_TABLET | Freq: Every day | ORAL | Status: DC
  Administered 2021-06-10: 25 mg via ORAL
  Filled 2021-06-10: qty 1

## 2021-06-10 NOTE — Evaluation (Signed)
Physical Therapy Evaluation ?Patient Details ?Name: Claire Hansen ?MRN: [id number] ?DOB: 1960-05-10 ?Today's Date: 06/10/2021 ? ?History of Present Illness ? Pt is a 61 yo female that presented with R sided weakness, MRI showed "acute pontine stroke".  PMH of anxiety, DM, anxiety, HTN, lumbar fusion, Abdominal supracervical hysterectomy and bilateral salpingo-oophorectomy. ?  ?Clinical Impression ? Pt alert, agreeable to PT, denied pain. She reported at baseline she is independent for most dressing, including donning/doffing shoes and AFOs. Able to transfer in and out of the shower, ambulatory with RW at home, denied falls in the last 6 months. Husband and son to be available at discharge as needed, and always present for stair navigation at home.  ? ?The patient reported continued R sided weakness and "tingly". RUE displayed weakness compared to LUE, but reported light touch sensation intact. Pt also reported RLE strength decreased from baseline as well, bilateral foot drop chronic, and pt able to don AFOs with husbands assistance today. Sit <> stand with RW and CGA, improved mechanics with adequate UE support. She was able to ambulate 136f total with RW and CGA. Noted for RLE circumduction and external rotation, with cues pt able to mildly improve. Cued for activity pacing as well. Stairs performed twice, CGA with bilateral rails, pt able to ascend with RLE (per normal for pt). Pt/family endorsed feeling better about stair navigation at discharge.  Overall the patient demonstrated deficits (see "PT Problem List") that impede the patient's functional abilities, safety, and mobility and would benefit from skilled PT intervention. Recommendation at this time is HHPT with 24/7 supervision/assistance to maximize function, strength, and safety.     ?   ? ?Recommendations for follow up therapy are one component of a multi-disciplinary discharge planning process, led by the attending physician.  Recommendations may be  updated based on patient status, additional functional criteria and insurance authorization. ? ?Follow Up Recommendations Home health PT ? ?  ?Assistance Recommended at Discharge Frequent or constant Supervision/Assistance  ?Patient can return home with the following ? A little help with walking and/or transfers;A little help with bathing/dressing/bathroom;Assistance with cooking/housework;Assist for transportation;Help with stairs or ramp for entrance ? ?  ?Equipment Recommendations None recommended by PT  ?Recommendations for Other Services ?    ?  ?Functional Status Assessment Patient has had a recent decline in their functional status and demonstrates the ability to make significant improvements in function in a reasonable and predictable amount of time.  ? ?  ?Precautions / Restrictions Precautions ?Precautions: Fall ?Required Braces or Orthoses: Other Brace ?Other Brace: bilateral AFOs for chronic footdrop ?Restrictions ?Weight Bearing Restrictions: No  ? ?  ? ?Mobility ? Bed Mobility ?Overal bed mobility: Needs Assistance ?Bed Mobility: Supine to Sit ?  ?  ?Supine to sit: HOB elevated, Min assist ?  ?  ?General bed mobility comments: hand assist from husband ?  ? ?Transfers ?Overall transfer level: Needs assistance ?Equipment used: Rolling walker (2 wheels) ?Transfers: Sit to/from Stand, Bed to chair/wheelchair/BSC ?Sit to Stand: Min guard ?Stand pivot transfers: Min guard ?  ?  ?  ?  ?General transfer comment: 1 instance of poor eccentric control ?  ? ?Ambulation/Gait ?Ambulation/Gait assistance: Min guard ?Gait Distance (Feet):  (920f and then additional 5041f?Assistive device: Rolling walker (2 wheels) ?  ?Gait velocity: decreased ?  ?  ?General Gait Details: circumduction of RLE noted, some external rotation as well. improvement noted with pt attending to RLE and cues for heel strike ? ?Stairs ?Stairs: Yes ?Stairs  assistance: Min guard, +2 safety/equipment ?Stair Management: Two rails, Step to  pattern ?Number of Stairs: 8 ?General stair comments: performed twice, second attempt safer, CGA ? ?Wheelchair Mobility ?  ? ?Modified Rankin (Stroke Patients Only) ?  ? ?  ? ?Balance Overall balance assessment: Needs assistance ?Sitting-balance support: Feet supported ?Sitting balance-Leahy Scale: Good ?  ?  ?Standing balance support: Bilateral upper extremity supported, Reliant on assistive device for balance ?Standing balance-Leahy Scale: Fair ?Standing balance comment: UE needed for any dynamic tasks ?  ?  ?  ?  ?  ?  ?  ?  ?  ?  ?  ?   ? ? ? ?Pertinent Vitals/Pain Pain Assessment ?Pain Assessment: No/denies pain  ? ? ?Home Living Family/patient expects to be discharged to:: Private residence ?Living Arrangements: Spouse/significant other;Children ?Available Help at Discharge: Family;Available 24 hours/day ?Type of Home: Apartment ?Home Access: Stairs to enter ?Entrance Stairs-Rails: Right;Left;Can reach both ?Entrance Stairs-Number of Steps: 40 (3rd floor with 2 x 20 steps to enter apt) ?  ?Home Layout: One level ?Home Equipment: Grab bars - tub/shower;BSC/3in1;Rolling Walker (2 wheels);Tub bench;Cane - single point;Transport chair;Toilet riser ?   ?  ?Prior Function Prior Level of Function : Needs assist ?  ?  ?  ?Physical Assist : Mobility (physical);ADLs (physical) ?  ?  ?Mobility Comments: Pt has in the last two weeks reported improvement in mobility, walking around with supervision/modI, stairs required CGA ?ADLs Comments: PRN, but pt reported can usually don/doff AFOs without assist ?  ? ? ?Hand Dominance  ?   ? ?  ?Extremity/Trunk Assessment  ? Upper Extremity Assessment ?Upper Extremity Assessment: Defer to OT evaluation (some RUE strength deficits compared to L, pt reported "tingling" of R hand but light touch sensation intact) ?  ? ?Lower Extremity Assessment ?Lower Extremity Assessment: Generalized weakness (Pt reported increased difficulty with RLE movement, assists with her hands as needed.  bilateral chronic foot drop) ?  ? ?   ?Communication  ? Communication: No difficulties  ?Cognition Arousal/Alertness: Awake/alert ?Behavior During Therapy: [facility] for tasks assessed/performed ?Overall Cognitive Status: Within Functional Limits for tasks assessed ?  ?  ?  ?  ?  ?  ?  ?  ?  ?  ?  ?  ?  ?  ?  ?  ?  ?  ?  ? ?  ?General Comments   ? ?  ?Exercises    ? ?Assessment/Plan  ?  ?PT Assessment Patient needs continued PT services  ?PT Problem List Decreased strength;Decreased mobility;Decreased activity tolerance;Decreased balance ? ?   ?  ?PT Treatment Interventions DME instruction;Therapeutic exercise;Gait training;Balance training;Stair training;Neuromuscular re-education;Functional mobility training;Therapeutic activities;Patient/family education   ? ?PT Goals (Current goals can be found in the Care Plan section)  ?Acute Rehab PT Goals ?Patient Stated Goal: to get to PLOF, and better if able ?PT Goal Formulation: With patient ?Time For Goal Achievement: 06/24/21 ?Potential to Achieve Goals: Good ? ?  ?Frequency 7X/week ?  ? ? ?Co-evaluation   ?  ?  ?  ?  ? ? ?  ?AM-PAC PT "6 Clicks" Mobility  ?Outcome Measure Help needed turning from your back to your side while in a flat bed without using bedrails?: A Little ?Help needed moving from lying on your back to sitting on the side of a flat bed without using bedrails?: A Little ?Help needed moving to and from a bed to a chair (including a wheelchair)?: A Little ?Help needed standing up from a  chair using your arms (e.g., wheelchair or bedside chair)?: A Little ?Help needed to walk in hospital room?: A Little ?Help needed climbing 3-5 steps with a railing? : A Little ?6 Click Score: 18 ? ?  ?End of Session Equipment Utilized During Treatment: Gait belt ?Activity Tolerance: Patient tolerated treatment well ?Patient left: in chair;with call bell/phone within reach;with family/visitor present ?Nurse Communication: Mobility status ?PT Visit Diagnosis: Other abnormalities  of gait and mobility (R26.89);Muscle weakness (generalized) (M62.81) ?  ? ?Time: 7544-9201 ?PT Time Calculation (min) (ACUTE ONLY): 39 min ? ? ?Charges:   PT Evaluation ?$PT Eval Low Complexity: 1 Low ?PT Treatments ?$

## 2021-06-10 NOTE — Consult Note (Addendum)
Neurology Consultation ?Reason for Consult: Stroke ?Referring Physician: Lolita Cram ? ?CC: Stroke ? ?History is obtained from:Patient ? ?HPI: Claire Hansen is a 61 y.o. female with a history of hypertension, hyperlipidemia, diabetes who presents with right arm weakness and numbness that started on Saturday, then worsened on Sunday and has been stable since that time.  She reports that she first noticed some numbness in the first two fingers of her right hand, and then the next day her whole arm got weak.  She is also noticed some leg weakness as well. ? ? ?LKW: Saturday ?tpa given?: no, out of window ? ? ?ROS: A 14 point ROS was performed and is negative except as noted in the HPI.  ?Past Medical History:  ?Diagnosis Date  ? Allergy   ? Anxiety   ? Asthma   ? pt denies having asthma  ? Chronic osteoarthritis   ? knees  ? Deviated septum   ? Diabetes mellitus without complication ([place])   ? Hyperlipidemia   ? Hypertension   ? Irregular menstrual cycle   ? Lymphadenopathy   ? Obesity   ? Peri-menopause   ? Pneumonia 2015  ? ? ? ?Family History  ?Problem Relation Age of Onset  ? Asthma Son   ? ? ? ?Social History:  reports that she has never smoked. She has never used smokeless tobacco. She reports that she does not drink alcohol and does not use drugs. ? ? ?Exam: ?Current vital signs: ?BP (!) 167/94 (BP Location: Left Arm)   Pulse 82   Temp 98.2 ?F (36.8 ?C) (Oral)   Resp 18   Ht '5\' 6"'$  (1.676 m)   Wt 106.6 kg   LMP 06/04/2016   SpO2 98%   BMI 37.93 kg/m?  ?Vital signs in last 24 hours: ?Temp:  [97.7 ?F (36.5 ?C)-98.2 ?F (36.8 ?C)] 98.2 ?F (36.8 ?C) (05/16 1442) ?Pulse Rate:  [72-85] 82 (05/16 1442) ?Resp:  [16-26] 18 (05/16 1442) ?BP: (160-207)/(76-112) 167/94 (05/16 1442) ?SpO2:  [94 %-99 %] 98 % (05/16 1442) ? ? ?Physical Exam  ?Constitutional: Appears well-developed and well-nourished.  ?Psych: Affect appropriate to situation ?Eyes: No scleral injection ?HENT: No OP obstruction ?MSK: no joint deformities.   ?Cardiovascular: Normal rate and regular rhythm.  ?Respiratory: Effort normal, non-labored breathing ?GI: Soft.  No distension. There is no tenderness.  ?Skin: WDI ? ?Neuro: ?Mental Status: ?Patient is awake, alert, oriented to person, place, month, year, and situation. ?Patient is able to give a clear and coherent history. ?No signs of aphasia or neglect ?Cranial Nerves: ?II: Visual Fields are full. Pupils are equal, round, and reactive to light.   ?III,IV, VI: EOMI without ptosis or diploplia.  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 ?XII: tongue is midline without atrophy or fasciculations.  ?Motor: ?Tone is normal. Bulk is normal. 5/5 strength was present on the right side, she has 4/5 weakness of the right arm and leg ?Sensory: ?Sensation is diminished to light touch in the right arm ?Cerebellar: ?She is significantly ataxic out of proportion to weakness on the right ? ? ? ? ?I have reviewed labs in epic and the results pertinent to this consultation are: ?LDL 130 ?A1c 7.0 ? ?I have reviewed the images obtained: MRI brain-pontine infarct on the left ?MRA she does have some large vessel atherosclerotic disease.  She does have questionable occlusion of the vertebral and left, but I do not think it would change management significantly so I will not order a CTA. ? ?Impression: 61 year old female with left pontine infarct.  This is most likely small vessel ischemic disease, and secondary risk factor modification will predominantly consist of small vessel risk factor control. ? ?Recommendations: ?1) agree with increasing statin to Lipitor 80 mg daily ?2) aspirin and Plavix for 3 weeks followed by aspirin 81 mg daily ?3) echocardiogram pending, but if unable to get as inpatient, I think this could be done as outpatient.  ?4)  PT, OT, ST ? ? ?Roland Rack, MD ?[facility] ?[phone] ? ?If 7pm- 7am, please page neurology on call as listed in [place]. ? ?

## 2021-06-10 NOTE — Progress Notes (Signed)
?  Chaplain On-Call responded to Spiritual Care Consult Order. ? ?Provided Advance Directives information to the patient and her husband. ? ?Provided education about the documents, and described the process for completion if patient chooses to have them Notarized while in the hospital. ? ?Patient and husband stated their understanding and that they will read and discuss further. ? ?Chaplain offered spiritual and emotional support. ? ?Chaplain Pollyann Samples ?M.Div., [facility] ?

## 2021-06-10 NOTE — Evaluation (Addendum)
Speech Language Pathology Evaluation ?Patient Details ?Name: Claire Hansen ?MRN: [id number] ?DOB: [birth year] ?Today's Date: 06/10/2021 ?Time: 1040-1120 ?SLP Time Calculation (min) (ACUTE ONLY): 40 min ? ?Problem List:  ?Patient Active Problem List  ? Diagnosis Date Noted  ? Acute CVA (cerebrovascular accident) ([place]) 06/09/2021  ? Post-operative state 01/10/2021  ? 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  ? Leg pain, anterior, left 03/22/2020  ? Primary localized osteoarthritis of left hip 07/01/2017  ? Primary localized osteoarthritis of right hip 11/05/2016  ? Hyperlipidemia 08/13/2014  ? History of asthma 08/13/2014  ? Resistant hypertension 08/13/2014  ? Microalbuminuria 08/13/2014  ? Morbid obesity with BMI of 45.0-49.9, adult ([place]) 08/13/2014  ? Osteoarthritis of right knee 08/13/2014  ? Allergic rhinitis, seasonal 08/13/2014  ? Diabetes mellitus with renal manifestation ([place]) 11/06/2010  ? ?Past Medical History:  ?Past Medical History:  ?Diagnosis Date  ? Allergy   ? Anxiety   ? Asthma   ? pt denies having asthma  ? Chronic osteoarthritis   ? knees  ? Deviated septum   ? Diabetes mellitus without complication ([place])   ? Hyperlipidemia   ? Hypertension   ? Irregular menstrual cycle   ? Lymphadenopathy   ? Obesity   ? Peri-menopause   ? Pneumonia 2015  ? ?Past Surgical History:  ?Past Surgical History:  ?Procedure Laterality Date  ? BACK SURGERY  07/2020  ? BREAST SURGERY Right   ? Cyst removed  ? CESAREAN SECTION    ? x2  ? CHOLECYSTECTOMY    ? CYSTOSCOPY  01/10/2021  ? Procedure: CYSTOSCOPY;  Surgeon: Schermerhorn, Gwen Her, MD;  Location: [facility] OR[facility];  Service: Gynecology;;  ? DILATION AND CURETTAGE OF UTERUS    ? Treatment of Metorrhagia and Endometrial Ablation  ? PERONEAL NERVE DECOMPRESSION Left 04/29/2020  ? Procedure: PERONEAL NERVE DECOMPRESSION;  Surgeon: Deetta Perla, MD;  Location: [facility];  Service: Neurosurgery;  Laterality: Left;  ? SUPRACERVICAL ABDOMINAL HYSTERECTOMY Bilateral 01/10/2021  ?  Procedure: HYSTERECTOMY ABDOMINAL WITH  BILATERA SALPINGO OOPHORECTOMY;  Surgeon: Schermerhorn, Gwen Her, MD;  Location: [facility];  Service: Gynecology;  Laterality: Bilateral;  ? TONSILLECTOMY    ? TOTAL HIP ARTHROPLASTY Right 11/05/2016  ? Procedure: TOTAL HIP ARTHROPLASTY ANTERIOR APPROACH;  Surgeon: Hessie Knows, MD;  Location: [facility];  Service: Orthopedics;  Laterality: Right;  ? TOTAL HIP ARTHROPLASTY Left 07/01/2017  ? Procedure: TOTAL HIP ARTHROPLASTY ANTERIOR APPROACH;  Surgeon: Hessie Knows, MD;  Location: [facility];  Service: Orthopedics;  Laterality: Left;  ? TOTAL HIP REVISION Left 05/03/2020  ? Procedure: LEFT FEMORAL COMPONENT REVISION;  Surgeon: Hessie Knows, MD;  Location: [facility];  Service: Orthopedics;  Laterality: Left;  ? ?HPI:  ?Pt is a 61 y.o. female with medical history significant for morbid obesity, htn, dm, and anxiety who presents with report of an odd sensation right hand, then weakness/numbness the right hand ~2 days ago.  Then yesterday, weakness involved the entire right side of the body with some mild difficulty speaking.  The speaking difficulty and weakness on the right side has improved but still decreased sensation right hand.  No trouble swallowing. No headache. No recent med changes. No prior history of stroke.    ?MRI: Small acute left pontine infarct without associated hemorrhage or  mass effect.  2. No other acute intracranial pathology.  ? ?Assessment / Plan / Recommendation ?Clinical Impression ? Pt appears to present w/ slight Dysarthria which impacts particular speech sounds, most heard by pt's Family. He stated it was  most notable ~2 days ago but now he only hears a slight discrepancy in "a word or two sometimes". Pt denied any notable speech issues impacting her verbal communication at this time. Intelligibility is 100%. Discussed pt's current presentation in setting of the small Left Pontine infarct (pt aware of but awaiting Neurology to f/u) as well as factors  impacting speech and language including Anxiety and Fatigue. Encouraged and educated pt on using strategies to focus on her speech a little more including use of: slowing rate of speech, emphasizing speech sounds, and supporting words w/ full breath support (getting Louder). Pt was able to teach-back these strategies; Handouts given to practice w/ and take home. Focused on over-articulation and slowing down which pt stated "helped". Pt presented no gross oral motor weakness during OM tasks. No apparent expressive or receptive aphasia noted, no cognitive deficits noted.  ?If needed, recommended pt f/u w/ Outpatient skilled ST services post returning home, if any changes in speech in ADLs need further addressing. NSG/MD updated. Pt/Husband agreed. ?   ?SLP Assessment ? SLP Recommendation/Assessment: All further Speech Lanaguage Pathology  needs can be addressed in the next venue of care (if needed) ?SLP Visit Diagnosis: Dysarthria and anarthria (R47.1)  ?  ?Recommendations for follow up therapy are one component of a multi-disciplinary discharge planning process, led by the attending physician.  Recommendations may be updated based on patient status, additional functional criteria and insurance authorization. ?   ?Follow Up Recommendations ? Follow physician's recommendations for discharge plan and follow up therapies  ?  ?Assistance Recommended at Discharge ? None  ?Functional Status Assessment Patient has had a recent decline in their functional status and demonstrates the ability to make significant improvements in function in a reasonable and predictable amount of time.  ?Frequency and Duration  (n/a)  ? (n/a) ?  ?   ?SLP Evaluation ?Cognition ? Overall Cognitive Status: Within Functional Limits for tasks assessed ?Arousal/Alertness: Awake/alert ?Orientation Level: Oriented X4 ?Year: 2023 ?Month: May ?Attention: Focused;Sustained ?Focused Attention: Appears intact ?Sustained Attention: Appears intact ?Awareness:  Appears intact ?Problem Solving: Appears intact ?Executive Function: Reasoning ?Reasoning: Appears intact ?Behaviors:  (n/a) ?Safety/Judgment: Appears intact  ?  ?   ?Comprehension ? Auditory Comprehension ?Overall Auditory Comprehension: Appears within functional limits for tasks assessed ?Visual Recognition/Discrimination ?Discrimination: Not tested  ?  ?Expression Expression ?Primary Mode of Expression: Verbal ?Verbal Expression ?Overall Verbal Expression: Appears within functional limits for tasks assessed ?Written Expression ?Dominant Hand: Right ?Written Expression: Not tested   ?Oral / Motor ? Oral Motor/Sensory Function ?Overall Oral Motor/Sensory Function: Within functional limits ?Motor Speech ?Overall Motor Speech: Appears within functional limits for tasks assessed ?Respiration: Within functional limits ?Phonation: Normal ?Resonance: Within functional limits ?Articulation: Impaired (slight -- moreso per husband present) ?Level of Impairment: Conversation ?Intelligibility: Intelligible ?Motor Planning: Witnin functional limits ?Motor Speech Errors: Not applicable ?Effective Techniques: Slow rate;Increased vocal intensity;Over-articulate   ?        ? ? ? ? ? ?Orinda Kenner, MS, CCC-SLP ?Speech Language Pathologist ?Rehab Services; [place] ?[phone] (ascom) ?Mckenzye Cutright ?06/10/2021, 2:01 PM ? ?

## 2021-06-10 NOTE — Evaluation (Signed)
Occupational Therapy Evaluation ?Patient Details ?Name: Claire Hansen ?MRN: [id number] ?DOB: 05-Sep-1960 ?Today's Date: 06/10/2021 ? ? ?History of Present Illness Pt is a 61 yo female that presented with R sided weakness, MRI showed "acute pontine stroke".  PMH of anxiety, DM, anxiety, HTN, lumbar fusion, Abdominal supracervical hysterectomy and bilateral salpingo-oophorectomy.  ? ?Clinical Impression ?  ?Pt was seen for OT evaluation this date. Prior to hospital admission, pt reported at baseline she is independent for most dressing, including donning/doffing shoes and AFOs. Able to transfer in and out of the shower, ambulatory with RW at home, denied falls in the last 6 months. Husband and son to be available at discharge as needed, and always present for stair navigation at home. Pt lives in an apartment on the 3rd floor with ~40 steps. Currently pt demonstrates impairments in RUE [facility], sensation, and strength as described below (See OT problem list) which functionally limit her ability to perform ADL/self-care tasks. Pt currently requires increased time/effort to complete [facility] ADL tasks, PRN assist for bathing, dressing, and toileting from spouse. Pt would benefit from skilled OT services to address noted impairments and functional limitations (see below for any additional details) in order to maximize safety and independence while minimizing falls risk and caregiver burden. Upon hospital discharge, recommend HHOT to maximize pt safety and return to functional independence during meaningful occupations of daily life.   ? ?Recommendations for follow up therapy are one component of a multi-disciplinary discharge planning process, led by the attending physician.  Recommendations may be updated based on patient status, additional functional criteria and insurance authorization.  ? ?Follow Up Recommendations ? Home health OT  ?  ?Assistance Recommended at Discharge Intermittent Supervision/Assistance  ?Patient can  return home with the following A little help with walking and/or transfers;A little help with bathing/dressing/bathroom;Assistance with cooking/housework;Assist for transportation;Help with stairs or ramp for entrance ? ?  ?Functional Status Assessment ? Patient has had a recent decline in their functional status and demonstrates the ability to make significant improvements in function in a reasonable and predictable amount of time.  ?Equipment Recommendations ? None recommended by OT  ?  ?Recommendations for Other Services   ? ? ?  ?Precautions / Restrictions Precautions ?Precautions: Fall ?Required Braces or Orthoses: Other Brace ?Other Brace: bilateral AFOs for chronic footdrop ?Restrictions ?Weight Bearing Restrictions: No  ? ?  ? ?Mobility Bed Mobility ?  ?  ?  ?  ?  ?  ?  ?General bed mobility comments: Pt in recliner ?  ? ?Transfers ?  ?  ?  ?  ?  ?  ?  ?  ?  ?General transfer comment: Pt just abck from the bathroom with spouse providing CGA + RW ?  ? ?  ?Balance Overall balance assessment: Needs assistance ?Sitting-balance support: Feet supported ?Sitting balance-Leahy Scale: Good ?  ?  ?Standing balance support: Bilateral upper extremity supported, Reliant on assistive device for balance ?Standing balance-Leahy Scale: Fair ?  ?  ?  ?  ?  ?  ?  ?  ?  ?  ?  ?  ?   ? ?ADL either performed or assessed with clinical judgement  ? ?ADL   ?  ?  ?  ?  ?  ?  ?  ?  ?  ?  ?  ?  ?  ?  ?  ?  ?  ?  ?  ?General ADL Comments: Pt able to complete ADL but with increased tiem/effort using  RUE 2/2 decr strength/FMC/sensation in R hand. More difficulty with higher level IADL such as typing and using a phone per pt report.  ? ? ? ?Vision   ?   ?   ?Perception   ?  ?Praxis   ?  ? ?Pertinent Vitals/Pain Pain Assessment ?Pain Assessment: No/denies pain  ? ? ? ?Hand Dominance Right ?  ?Extremity/Trunk Assessment Upper Extremity Assessment ?Upper Extremity Assessment: RUE deficits/detail ?RUE Deficits / Details: mild strength  deficits, FMC deficits, and impaired sensation ("pins and needles"); functionally able to complete ADL like grooming, self feeding but tired more quickly and more clumsy ?RUE Sensation: decreased light touch ?RUE Coordination: decreased fine motor ?  ?Lower Extremity Assessment ?Lower Extremity Assessment: Generalized weakness (Pt reported increased difficulty with RLE movement, assists with her hands as needed. bilateral chronic foot drop) ?  ?  ?  ?Communication Communication ?Communication: No difficulties ?  ?Cognition Arousal/Alertness: Awake/alert ?Behavior During Therapy: [facility] for tasks assessed/performed ?Overall Cognitive Status: Within Functional Limits for tasks assessed ?  ?  ?  ?  ?  ?  ?  ?  ?  ?  ?  ?  ?  ?  ?  ?  ?  ?  ?  ?General Comments    ? ?  ?Exercises Other Exercises ?Other Exercises: Pt/spouse instructed in [facility] ex/activities and additional IADL with ADL modifications to maximize RUE use and incorporation into tasks to address impairments. Pt able to return demo ?  ?Shoulder Instructions    ? ? ?Home Living Family/patient expects to be discharged to:: Private residence ?Living Arrangements: Spouse/significant other;Children ?Available Help at Discharge: Family;Available 24 hours/day ?Type of Home: Apartment ?Home Access: Stairs to enter ?Entrance Stairs-Number of Steps: 40 (3rd floor with 2 x 20 steps to enter apt) ?Entrance Stairs-Rails: Right;Left;Can reach both ?Home Layout: One level ?  ?  ?Bathroom Shower/Tub: Tub/shower unit ?  ?Bathroom Toilet: Standard ?  ?  ?Home Equipment: Grab bars - tub/shower;BSC/3in1;Rolling Walker (2 wheels);Tub bench;Cane - single point;Transport chair;Toilet riser ?  ?  ?  ? ?  ?Prior Functioning/Environment Prior Level of Function : Needs assist ?  ?  ?  ?Physical Assist : Mobility (physical);ADLs (physical) ?  ?  ?Mobility Comments: Pt has in the last two weeks reported improvement in mobility, walking around with supervision/modI, stairs required CGA ?ADLs  Comments: PRN, but pt reported can usually don/doff AFOs without assist ?  ? ?  ?  ?OT Problem List: Decreased strength;Decreased coordination;Impaired sensation;Impaired UE functional use;Impaired balance (sitting and/or standing) ?  ?   ?OT Treatment/Interventions: Self-care/ADL training;Therapeutic exercise;Therapeutic activities;Neuromuscular education;DME and/or AE instruction;Patient/family education;Balance training  ?  ?OT Goals(Current goals can be found in the care plan section) Acute Rehab OT Goals ?Patient Stated Goal: be independent ?OT Goal Formulation: With patient/family ?Time For Goal Achievement: 06/24/21 ?Potential to Achieve Goals: Good ?ADL Goals ?Pt/caregiver will Perform Home Exercise Program: Increased strength;Right Upper extremity;With written HEP provided (RUE [facility])  ?OT Frequency: Min 3X/week ?  ? ?Co-evaluation   ?  ?  ?  ?  ? ?  ?AM-PAC OT "6 Clicks" Daily Activity     ?Outcome Measure Help from another person eating meals?: None ?Help from another person taking care of personal grooming?: A Little ?Help from another person toileting, which includes using toliet, bedpan, or urinal?: A Little ?Help from another person bathing (including washing, rinsing, drying)?: A Little ?Help from another person to put on and taking off regular upper body clothing?: None ?  Help from another person to put on and taking off regular lower body clothing?: A Little ?6 Click Score: 20 ?  ?End of Session   ? ?Activity Tolerance: Patient tolerated treatment well ?Patient left: in chair;with call bell/phone within reach;with family/visitor present ? ?OT Visit Diagnosis: Other abnormalities of gait and mobility (R26.89);Hemiplegia and hemiparesis ?Hemiplegia - Right/Left: Right ?Hemiplegia - dominant/non-dominant: Dominant ?Hemiplegia - caused by: Cerebral infarction  ?              ?Time: 9163-8466 ?OT Time Calculation (min): 15 min ?Charges:  OT General Charges ?$OT Visit: 1 Visit ?OT Evaluation ?$OT Eval Low  Complexity: 1 Low ?OT Treatments ?$Neuromuscular Re-education: 8-22 mins ? ?Ardeth Perfect., MPH, MS, OTR/L ?ascom [phone] ?06/10/21, 12:04 PM ? ?

## 2021-06-10 NOTE — Discharge Summary (Signed)
Physician Discharge Summary   Claire Hansen  female DOB: [birth year]  SNK:[id number]  PCP: Perrin Maltese, MD  Admit date: 06/09/2021 Discharge date: 06/10/2021  Admitted From: home Disposition:  home CODE STATUS: Full code  Discharge Instructions     Discharge instructions   Complete by: As directed    You have had a small stroke.  Neurology wants you to take aspirin 81 mg and Plavix 75 mg daily together for 3 weeks, and then just aspirin 81 mg daily after that.  Please follow up with Dr. Melrose Nakayama as outpatient.  We have increased your cholesterol medication to Lipitor 80 mg daily.  Your blood pressure has been elevated, please follow up with PCP for further blood pressure adjustment.   Dr. Enzo Bi [facility] Course:  For full details, please see H&P, progress notes, consult notes and ancillary notes.  Briefly,  Claire Hansen is a 61 y.o. female with medical history significant for morbid obesity, htn, dm, who presents with right sided weakness.  Symptoms began 2 days ago. Began with an odd sensation right hand, then weakness/numbness the right hand. Then the day prior to presentation, had weakness the entire right side of the body with some mild difficulty speaking. The speaking difficulty and weakness on the right side has improved but still decreased sensation right hand.   # Acute stroke Small left pontine stroke. Severe p2 stenosis, left vertebral artery not well visualized. Neuro deficit is mild.  --Per neuro rec, pt is to take aspirin 81 mg and Plavix 75 mg daily together for 3 weeks, and then just aspirin 81 mg daily after that.   --home statin increased to Lipitor 80. --Pt is to follow up with Dr. Melrose Nakayama as outpatient.   # HTN Here bp elevated --cont home Toprol, Olmesartan-amLODIPine-HCTZ and spironolactone.   --hydralazine 10 mg daily was removed since that's not a therapeutic dose.   --pt to follow up with PCP for further management of HTN  #  Chronic pain - not taking opioids PTA --cont home gabapentin   # T2DM --A1c 7.0.  well controlled. --discharged back on home regimen as below.  Unless note above, medications under STOP are ones pt wasn't taking PTA.   Discharge Diagnoses:  Principal Problem:   Acute CVA (cerebrovascular accident) ([place]) Active Problems:   Resistant hypertension   Morbid obesity with BMI of 45.0-49.9, adult ([place])   Diabetes mellitus with renal manifestation ([place])   30 Day Unplanned Readmission Risk Score    Flowsheet Row ED to Hosp-Admission (Current) from 06/09/2021 in [place]  30 Day Unplanned Readmission Risk Score (%) 8.91 Filed at 06/10/2021 1200       This score is the patient's risk of an unplanned readmission within 30 days of being discharged (0 -100%). The score is based on dignosis, age, lab data, medications, orders, and past utilization.   Low:  0-14.9   Medium: 15-21.9   High: 22-29.9   Extreme: 30 and above         Discharge Instructions:  Allergies as of 06/10/2021       Reactions   Shellfish Allergy Anaphylaxis, Nausea And Vomiting   Topical betadine is OK to use.        Medication List     STOP taking these medications    DULoxetine 60 MG capsule Commonly known as: CYMBALTA   hydrALAZINE 10 MG tablet Commonly known as: APRESOLINE  oxyCODONE 5 MG immediate release tablet Commonly known as: Oxy IR/ROXICODONE   oxyCODONE-acetaminophen 5-325 MG tablet Commonly known as: Percocet   rosuvastatin 10 MG tablet Commonly known as: CRESTOR   traMADol 50 MG tablet Commonly known as: ULTRAM       TAKE these medications    acetaminophen 500 MG tablet Commonly known as: TYLENOL Take 1,000 mg by mouth every 8 (eight) hours as needed for mild pain or headache.   aspirin 81 MG EC tablet Take 1 tablet (81 mg total) by mouth daily. Swallow whole. Start taking on: Jun 11, 2021   atorvastatin 80 MG  tablet Commonly known as: LIPITOR Take 1 tablet (80 mg total) by mouth daily. Start taking on: Jun 11, 2021   clopidogrel 75 MG tablet Commonly known as: PLAVIX Take 1 tablet (75 mg total) by mouth daily for 21 days. Start taking on: Jun 11, 2021   famotidine 10 MG tablet Commonly known as: PEPCID Take 10 mg by mouth daily as needed for heartburn or indigestion.   gabapentin 300 MG capsule Commonly known as: NEURONTIN Take 300 mg by mouth. '300mg'$  qam and '600mg'$  qhs What changed: Another medication with the same name was removed. Continue taking this medication, and follow the directions you see here.   HumaLOG KwikPen 100 UNIT/ML KwikPen Generic drug: insulin lispro Inject 2 Units into the skin 2 (two) times daily as needed for high blood sugar.   hydrocortisone cream 0.5 % Apply 1 application topically daily as needed for itching ([place]).   ibuprofen 800 MG tablet Commonly known as: ADVIL Take 800 mg by mouth every 8 (eight) hours as needed.   meloxicam 15 MG tablet Commonly known as: MOBIC Take 1 tablet (15 mg total) by mouth daily as needed for pain. Home med. What changed:  when to take this reasons to take this additional instructions   metFORMIN 500 MG 24 hr tablet Commonly known as: GLUCOPHAGE-XR Take 1 tablet (500 mg total) by mouth 2 (two) times daily. Home med. What changed:  when to take this additional instructions   methocarbamol 750 MG tablet Commonly known as: ROBAXIN Take 750 mg by mouth 4 (four) times daily as needed for muscle spasms.   metoprolol succinate 25 MG 24 hr tablet Commonly known as: TOPROL-XL Take 25 mg by mouth daily.   montelukast 10 MG tablet Commonly known as: SINGULAIR Take 1 tablet (10 mg total) by mouth daily as needed (allergies). Home med. What changed:  when to take this reasons to take this additional instructions   multivitamin with minerals Tabs tablet Take 1 tablet by mouth daily.   Olmesartan-amLODIPine-HCTZ  40-10-25 MG Tabs Take 1 tablet by mouth daily.   Rybelsus 7 MG Tabs Generic drug: Semaglutide Take 7 mg by mouth daily.   spironolactone 25 MG tablet Commonly known as: ALDACTONE Take 25 mg by mouth daily.         Follow-up Information     Perrin Maltese, MD Follow up in 1 week(s).   Specialty: Internal Medicine Contact information: [place] [address] [phone]         Anabel Bene, MD Follow up in 1 month(s).   Specialty: Neurology Contact information: [facility] [place] [address] [phone]                 Allergies  Allergen Reactions   Shellfish Allergy Anaphylaxis and Nausea And Vomiting    Topical betadine is OK to  use.     The results of significant diagnostics from this hospitalization (including imaging, microbiology, ancillary and laboratory) are listed below for reference.   Consultations:   Procedures/Studies: CT HEAD WO CONTRAST (5MM)  Result Date: 06/09/2021 CLINICAL DATA:  History of stroke-like symptoms that started yesterday, numbness and tingling in the RIGHT arm and slurred speech. EXAM: CT HEAD WITHOUT CONTRAST TECHNIQUE: Contiguous axial images were obtained from the base of the skull through the vertex without intravenous contrast. RADIATION DOSE REDUCTION: This exam was performed according to the departmental dose-optimization program which includes automated exposure control, adjustment of the mA and/or kV according to patient size and/or use of iterative reconstruction technique. COMPARISON:  March 22, 2020. FINDINGS: Brain: No evidence of acute infarction, hemorrhage, hydrocephalus, extra-axial collection or mass lesion/mass effect. Vascular: No hyperdense vessel or unexpected calcification. Skull: Normal. Negative for fracture or focal lesion. Sinuses/Orbits: Unremarkable to the extent evaluated, incompletely imaged. Other: None IMPRESSION: No acute  intracranial abnormality. Electronically Signed   By: Zetta Bills M.D.   On: 06/09/2021 12:55   MR ANGIO HEAD WO CONTRAST  Result Date: 06/09/2021 CLINICAL DATA:  Right hand tingling on Saturday night, spread to the hold hand followed by slurred speech and right leg weakness. EXAM: MRI HEAD WITHOUT CONTRAST MRA HEAD WITHOUT CONTRAST MRA NECK WITHOUT AND WITH CONTRAST TECHNIQUE: Multiplanar, multi-echo pulse sequences of the brain and surrounding structures were acquired without intravenous contrast. Angiographic images of the Circle of Willis were acquired using MRA technique without intravenous contrast. Angiographic images of the neck were acquired using MRA technique without and with intravenous contrast. Carotid stenosis measurements (when applicable) are obtained utilizing NASCET criteria, using the distal internal carotid diameter as the denominator. CONTRAST:  62m GADAVIST GADOBUTROL 1 MMOL/ML IV SOLN COMPARISON:  Same-day noncontrast CT head FINDINGS: MRI HEAD FINDINGS Brain: There is a small acute infarct in the left pons. There is no associated hemorrhage or mass effect. There is no other acute infarct. There is no acute intracranial hemorrhage or extra-axial fluid collection Background parenchymal volume is normal. The ventricles are normal in size. Gray-white differentiation is preserved. Parenchymal signal is otherwise normal. There is an expanded, partially empty sella, nonspecific. There is no mass lesion.  There is no mass effect or midline shift. Vascular: The major intracranial flow voids are normal. See below for full evaluation of the vasculature. Skull and upper cervical spine: Calvarial marrow signal is diffusely T1 hypointense, nonspecific. Marrow signal in the clivus and upper cervical spine is normal. Sinuses/Orbits: The paranasal sinuses are clear. The globes and orbits are unremarkable. Other: None. MRA HEAD FINDINGS Anterior circulation: The intracranial ICAs are patent. The  bilateral MCAs are patent. The bilateral ACAs are patent. With an absent/hypoplastic right A1 segment, likely a developmental variant. There is no aneurysm or AVM. Posterior circulation: There is no flow related enhancement in the left V4 segment after the origin of the posteroinferior cerebellar artery. There is intermittent diminutive enhancement of the V4 segment on the postcontrast MRA neck images. The right V4 segment is patent. PICA is also identified on the right. Basilar artery is patent but diminutive. The bilateral PCAs are patent, primarily supplied by prominent posterior communicating arteries with diminutive P1 segments (fetal origins. There is focal severe stenosis of the distal right PCA with distal reconstitution (1-141, 1039-15). There is no aneurysm or AVM. Anatomic variants: As above. MRA NECK FINDINGS Postcontrast MRA neck images are suboptimal due to bolus timing with significant venous contamination. Aortic arch: Imaged aortic  arch is normal. The origins of the major branch vessels are patent. The subclavian arteries are patent to the level imaged. Right carotid system: The right common, internal, and external carotid arteries are patent, without evidence of hemodynamically significant stenosis or occlusion. There is no evidence of dissection or aneurysm. Left carotid system: The left common, internal, and external carotid arteries are patent, without evidence of hemodynamically significant stenosis or occlusion. There is no evidence of dissection or aneurysm. Vertebral arteries: Postcontrast enhancement of the V1 and V2 segments is diminutive bilaterally. The left V1 and proximal V2 segments are not well seen on the postcontrast sequence. There is intermittent narrowing of the left V2 segment on the time-of-flight sequence. There is diminished enhancement of the V4 segment after the PICA origin with occlusion prior to the vertebrobasilar junction (18-70). There is no definite focal stenosis or  occlusion on the right. There is no definite dissection or aneurysm. Other: There is a 2.6 cm thyroid nodule on the right. IMPRESSION: MR head: 1. Small acute left pontine infarct without associated hemorrhage or mass effect. 2. No other acute intracranial pathology. MRA head: 1. 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 2. The basilar artery is patent but diminutive, with the posterior cerebral arteries primarily supplied by prominent posterior communicating arteries. 3. Focal severe stenosis of the distal right P2 segment with distal reconstitution. No other proximal high-grade stenosis or occlusion. MRA neck: 1. Suboptimal image quality due to bolus timing with significant venous contamination. 2. The proximal left vertebral artery is not identified on the postcontrast images suspected to be occluded, with intermittent narrowing of the V2 segment throughout the neck. Consider CTA of the neck for better evaluation of the vertebral artery given artifact on this study. 3. Nonenhancement of the V4 segment shortly after the PICA origin consistent with occlusion. 4. No other definite hemodynamically significant stenosis in the neck. The right vertebral artery appears patent. 5. 2.6 cm right thyroid nodule for which nonemergent thyroid ultrasound is recommended. Electronically Signed   By: Valetta Mole M.D.   On: 06/09/2021 17:30   MR Angiogram Neck W or Wo Contrast  Result Date: 06/09/2021 CLINICAL DATA:  Right hand tingling on Saturday night, spread to the hold hand followed by slurred speech and right leg weakness. EXAM: MRI HEAD WITHOUT CONTRAST MRA HEAD WITHOUT CONTRAST MRA NECK WITHOUT AND WITH CONTRAST TECHNIQUE: Multiplanar, multi-echo pulse sequences of the brain and surrounding structures were acquired without intravenous contrast. Angiographic images of the Circle of Willis were acquired using MRA technique without intravenous contrast. Angiographic images of the neck were acquired  using MRA technique without and with intravenous contrast. Carotid stenosis measurements (when applicable) are obtained utilizing NASCET criteria, using the distal internal carotid diameter as the denominator. CONTRAST:  26m GADAVIST GADOBUTROL 1 MMOL/ML IV SOLN COMPARISON:  Same-day noncontrast CT head FINDINGS: MRI HEAD FINDINGS Brain: There is a small acute infarct in the left pons. There is no associated hemorrhage or mass effect. There is no other acute infarct. There is no acute intracranial hemorrhage or extra-axial fluid collection Background parenchymal volume is normal. The ventricles are normal in size. Gray-white differentiation is preserved. Parenchymal signal is otherwise normal. There is an expanded, partially empty sella, nonspecific. There is no mass lesion.  There is no mass effect or midline shift. Vascular: The major intracranial flow voids are normal. See below for full evaluation of the vasculature. Skull and upper cervical spine: Calvarial marrow signal is diffusely T1 hypointense, nonspecific. Marrow  signal in the clivus and upper cervical spine is normal. Sinuses/Orbits: The paranasal sinuses are clear. The globes and orbits are unremarkable. Other: None. MRA HEAD FINDINGS Anterior circulation: The intracranial ICAs are patent. The bilateral MCAs are patent. The bilateral ACAs are patent. With an absent/hypoplastic right A1 segment, likely a developmental variant. There is no aneurysm or AVM. Posterior circulation: There is no flow related enhancement in the left V4 segment after the origin of the posteroinferior cerebellar artery. There is intermittent diminutive enhancement of the V4 segment on the postcontrast MRA neck images. The right V4 segment is patent. PICA is also identified on the right. Basilar artery is patent but diminutive. The bilateral PCAs are patent, primarily supplied by prominent posterior communicating arteries with diminutive P1 segments (fetal origins. There is focal  severe stenosis of the distal right PCA with distal reconstitution (1-141, 1039-15). There is no aneurysm or AVM. Anatomic variants: As above. MRA NECK FINDINGS Postcontrast MRA neck images are suboptimal due to bolus timing with significant venous contamination. Aortic arch: Imaged aortic arch is normal. The origins of the major branch vessels are patent. The subclavian arteries are patent to the level imaged. Right carotid system: The right common, internal, and external carotid arteries are patent, without evidence of hemodynamically significant stenosis or occlusion. There is no evidence of dissection or aneurysm. Left carotid system: The left common, internal, and external carotid arteries are patent, without evidence of hemodynamically significant stenosis or occlusion. There is no evidence of dissection or aneurysm. Vertebral arteries: Postcontrast enhancement of the V1 and V2 segments is diminutive bilaterally. The left V1 and proximal V2 segments are not well seen on the postcontrast sequence. There is intermittent narrowing of the left V2 segment on the time-of-flight sequence. There is diminished enhancement of the V4 segment after the PICA origin with occlusion prior to the vertebrobasilar junction (18-70). There is no definite focal stenosis or occlusion on the right. There is no definite dissection or aneurysm. Other: There is a 2.6 cm thyroid nodule on the right. IMPRESSION: MR head: 1. Small acute left pontine infarct without associated hemorrhage or mass effect. 2. No other acute intracranial pathology. MRA head: 1. 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 2. The basilar artery is patent but diminutive, with the posterior cerebral arteries primarily supplied by prominent posterior communicating arteries. 3. Focal severe stenosis of the distal right P2 segment with distal reconstitution. No other proximal high-grade stenosis or occlusion. MRA neck: 1. Suboptimal image quality  due to bolus timing with significant venous contamination. 2. The proximal left vertebral artery is not identified on the postcontrast images suspected to be occluded, with intermittent narrowing of the V2 segment throughout the neck. Consider CTA of the neck for better evaluation of the vertebral artery given artifact on this study. 3. Nonenhancement of the V4 segment shortly after the PICA origin consistent with occlusion. 4. No other definite hemodynamically significant stenosis in the neck. The right vertebral artery appears patent. 5. 2.6 cm right thyroid nodule for which nonemergent thyroid ultrasound is recommended. Electronically Signed   By: Valetta Mole M.D.   On: 06/09/2021 17:30   MR BRAIN WO CONTRAST  Result Date: 06/09/2021 CLINICAL DATA:  Right hand tingling on Saturday night, spread to the hold hand followed by slurred speech and right leg weakness. EXAM: MRI HEAD WITHOUT CONTRAST MRA HEAD WITHOUT CONTRAST MRA NECK WITHOUT AND WITH CONTRAST TECHNIQUE: Multiplanar, multi-echo pulse sequences of the brain and surrounding structures were acquired without intravenous  contrast. Angiographic images of the Circle of Willis were acquired using MRA technique without intravenous contrast. Angiographic images of the neck were acquired using MRA technique without and with intravenous contrast. Carotid stenosis measurements (when applicable) are obtained utilizing NASCET criteria, using the distal internal carotid diameter as the denominator. CONTRAST:  82m GADAVIST GADOBUTROL 1 MMOL/ML IV SOLN COMPARISON:  Same-day noncontrast CT head FINDINGS: MRI HEAD FINDINGS Brain: There is a small acute infarct in the left pons. There is no associated hemorrhage or mass effect. There is no other acute infarct. There is no acute intracranial hemorrhage or extra-axial fluid collection Background parenchymal volume is normal. The ventricles are normal in size. Gray-white differentiation is preserved. Parenchymal signal is  otherwise normal. There is an expanded, partially empty sella, nonspecific. There is no mass lesion.  There is no mass effect or midline shift. Vascular: The major intracranial flow voids are normal. See below for full evaluation of the vasculature. Skull and upper cervical spine: Calvarial marrow signal is diffusely T1 hypointense, nonspecific. Marrow signal in the clivus and upper cervical spine is normal. Sinuses/Orbits: The paranasal sinuses are clear. The globes and orbits are unremarkable. Other: None. MRA HEAD FINDINGS Anterior circulation: The intracranial ICAs are patent. The bilateral MCAs are patent. The bilateral ACAs are patent. With an absent/hypoplastic right A1 segment, likely a developmental variant. There is no aneurysm or AVM. Posterior circulation: There is no flow related enhancement in the left V4 segment after the origin of the posteroinferior cerebellar artery. There is intermittent diminutive enhancement of the V4 segment on the postcontrast MRA neck images. The right V4 segment is patent. PICA is also identified on the right. Basilar artery is patent but diminutive. The bilateral PCAs are patent, primarily supplied by prominent posterior communicating arteries with diminutive P1 segments (fetal origins. There is focal severe stenosis of the distal right PCA with distal reconstitution (1-141, 1039-15). There is no aneurysm or AVM. Anatomic variants: As above. MRA NECK FINDINGS Postcontrast MRA neck images are suboptimal due to bolus timing with significant venous contamination. Aortic arch: Imaged aortic arch is normal. The origins of the major branch vessels are patent. The subclavian arteries are patent to the level imaged. Right carotid system: The right common, internal, and external carotid arteries are patent, without evidence of hemodynamically significant stenosis or occlusion. There is no evidence of dissection or aneurysm. Left carotid system: The left common, internal, and  external carotid arteries are patent, without evidence of hemodynamically significant stenosis or occlusion. There is no evidence of dissection or aneurysm. Vertebral arteries: Postcontrast enhancement of the V1 and V2 segments is diminutive bilaterally. The left V1 and proximal V2 segments are not well seen on the postcontrast sequence. There is intermittent narrowing of the left V2 segment on the time-of-flight sequence. There is diminished enhancement of the V4 segment after the PICA origin with occlusion prior to the vertebrobasilar junction (18-70). There is no definite focal stenosis or occlusion on the right. There is no definite dissection or aneurysm. Other: There is a 2.6 cm thyroid nodule on the right. IMPRESSION: MR head: 1. Small acute left pontine infarct without associated hemorrhage or mass effect. 2. No other acute intracranial pathology. MRA head: 1. 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 2. The basilar artery is patent but diminutive, with the posterior cerebral arteries primarily supplied by prominent posterior communicating arteries. 3. Focal severe stenosis of the distal right P2 segment with distal reconstitution. No other proximal high-grade stenosis or occlusion. MRA  neck: 1. Suboptimal image quality due to bolus timing with significant venous contamination. 2. The proximal left vertebral artery is not identified on the postcontrast images suspected to be occluded, with intermittent narrowing of the V2 segment throughout the neck. Consider CTA of the neck for better evaluation of the vertebral artery given artifact on this study. 3. Nonenhancement of the V4 segment shortly after the PICA origin consistent with occlusion. 4. No other definite hemodynamically significant stenosis in the neck. The right vertebral artery appears patent. 5. 2.6 cm right thyroid nodule for which nonemergent thyroid ultrasound is recommended. Electronically Signed   By: Valetta Mole M.D.    On: 06/09/2021 17:30      Labs: BNP (last 3 results) No results for input(s): BNP in the last 8760 hours. Basic Metabolic Panel: Recent Labs  Lab 06/09/21 1210  NA 139  K 3.7  CL 106  CO2 22  GLUCOSE 139*  BUN 15  CREATININE 0.88  CALCIUM 10.3   Liver Function Tests: Recent Labs  Lab 06/09/21 1210  AST 20  ALT 17  ALKPHOS 101  BILITOT 0.6  PROT 8.2*  ALBUMIN 4.5   No results for input(s): LIPASE, AMYLASE in the last 168 hours. No results for input(s): AMMONIA in the last 168 hours. CBC: Recent Labs  Lab 06/09/21 1210  WBC 9.7  NEUTROABS 6.3  HGB 13.7  HCT 41.0  MCV 82.0  PLT 305   Cardiac Enzymes: No results for input(s): CKTOTAL, CKMB, CKMBINDEX, TROPONINI in the last 168 hours. BNP: Invalid input(s): POCBNP CBG: Recent Labs  Lab 06/09/21 1211 06/09/21 2058 06/10/21 0726 06/10/21 1237  GLUCAP 151* 175* 186* 175*   D-Dimer No results for input(s): DDIMER in the last 72 hours. Hgb A1c Recent Labs    06/10/21 0445  HGBA1C 7.0*   Lipid Profile Recent Labs    06/10/21 0445  CHOL 229*  HDL 36*  LDLCALC 130*  TRIG 313*  CHOLHDL 6.4   Thyroid function studies No results for input(s): TSH, T4TOTAL, T3FREE, THYROIDAB in the last 72 hours.  Invalid input(s): FREET3 Anemia work up No results for input(s): VITAMINB12, FOLATE, FERRITIN, TIBC, IRON, RETICCTPCT in the last 72 hours. Urinalysis    Component Value Date/Time   COLORURINE STRAW (A) 06/09/2021 1500   APPEARANCEUR CLEAR (A) 06/09/2021 1500   LABSPEC 1.010 06/09/2021 1500   PHURINE 5.0 06/09/2021 1500   GLUCOSEU NEGATIVE 06/09/2021 1500   HGBUR NEGATIVE 06/09/2021 1500   BILIRUBINUR NEGATIVE 06/09/2021 1500   BILIRUBINUR neg 08/14/2014 0842   KETONESUR NEGATIVE 06/09/2021 1500   PROTEINUR NEGATIVE 06/09/2021 1500   UROBILINOGEN 0.2 08/14/2014 0842   NITRITE NEGATIVE 06/09/2021 1500   LEUKOCYTESUR NEGATIVE 06/09/2021 1500   Sepsis Labs Invalid input(s): PROCALCITONIN,  WBC,   LACTICIDVEN Microbiology Recent Results (from the past 240 hour(s))  Resp Panel by RT-PCR (Flu A&B, Covid) Nasopharyngeal Swab     Status: None   Collection Time: 06/09/21  5:52 PM   Specimen: Nasopharyngeal Swab; Nasopharyngeal(NP) swabs in vial transport medium  Result Value Ref Range Status   SARS Coronavirus 2 by RT PCR NEGATIVE NEGATIVE Final    Comment: (NOTE) SARS-CoV-2 target nucleic acids are NOT DETECTED.  The SARS-CoV-2 RNA is generally detectable in upper respiratory specimens during the acute phase of infection. The lowest concentration of SARS-CoV-2 viral copies this assay can detect is 138 copies/mL. A negative result does not preclude SARS-Cov-2 infection and should not be used as the sole basis for treatment or other patient  management decisions. A negative result may occur with  improper specimen collection/handling, submission of specimen other than nasopharyngeal swab, presence of viral mutation(s) within the areas targeted by this assay, and inadequate number of viral copies(<138 copies/mL). A negative result must be combined with clinical observations, patient history, and epidemiological information. The expected result is Negative.  Fact Sheet for Patients:  EntrepreneurPulse.com.au  Fact Sheet for Healthcare Providers:  IncredibleEmployment.be  This test is no t yet approved or cleared by the [place] FDA and  has been authorized for detection and/or diagnosis of SARS-CoV-2 by FDA under an Emergency Use Authorization (EUA). This EUA will remain  in effect (meaning this test can be used) for the duration of the COVID-19 declaration under Section 564(b)(1) of the Act, 21 U.S.C.section 360bbb-3(b)(1), unless the authorization is terminated  or revoked sooner.       Influenza A by PCR NEGATIVE NEGATIVE Final   Influenza B by PCR NEGATIVE NEGATIVE Final    Comment: (NOTE) The Xpert Xpress SARS-CoV-2/FLU/RSV plus  assay is intended as an aid in the diagnosis of influenza from Nasopharyngeal swab specimens and should not be used as a sole basis for treatment. Nasal washings and aspirates are unacceptable for Xpert Xpress SARS-CoV-2/FLU/RSV testing.  Fact Sheet for Patients: EntrepreneurPulse.com.au  Fact Sheet for Healthcare Providers: IncredibleEmployment.be  This test is not yet approved or cleared by the [place] FDA and has been authorized for detection and/or diagnosis of SARS-CoV-2 by FDA under an Emergency Use Authorization (EUA). This EUA will remain in effect (meaning this test can be used) for the duration of the COVID-19 declaration under Section 564(b)(1) of the Act, 21 U.S.C. section 360bbb-3(b)(1), unless the authorization is terminated or revoked.  Performed at [facility], [address] [place], [place] [address]      Total time spend on discharging this patient, including the last patient exam, discussing the hospital stay, instructions for ongoing care as it relates to all pertinent caregivers, as well as preparing the medical discharge records, prescriptions, and/or referrals as applicable, is 40 minutes.    Enzo Bi, MD  [facility] 06/10/2021, 3:29 PM

## 2021-06-26 ENCOUNTER — Ambulatory Visit
Admission: RE | Admit: 2021-06-26 | Discharge: 2021-06-26 | Disposition: A | Discharge status: Home / Self Care | Payer: Managed Care, Other (non HMO) | Source: Ambulatory Visit | Attending: Family | Admitting: Family

## 2021-06-26 DIAGNOSIS — Z1231 Encounter for screening mammogram for malignant neoplasm of breast: Secondary | ICD-10-CM | POA: Diagnosis not present

## 2021-06-26 LAB — HM MAMMOGRAPHY

## 2021-07-10 ENCOUNTER — Emergency Department: Payer: Managed Care, Other (non HMO)

## 2021-07-10 ENCOUNTER — Emergency Department
Admission: EM | Admit: 2021-07-10 | Discharge: 2021-07-10 | Disposition: A | Discharge status: Home / Self Care | Payer: Managed Care, Other (non HMO) | Attending: Emergency Medicine | Admitting: Emergency Medicine

## 2021-07-10 ENCOUNTER — Other Ambulatory Visit: Payer: Self-pay

## 2021-07-10 DIAGNOSIS — I10 Essential (primary) hypertension: Secondary | ICD-10-CM | POA: Insufficient documentation

## 2021-07-10 DIAGNOSIS — E119 Type 2 diabetes mellitus without complications: Secondary | ICD-10-CM | POA: Diagnosis not present

## 2021-07-10 DIAGNOSIS — R202 Paresthesia of skin: Secondary | ICD-10-CM

## 2021-07-10 DIAGNOSIS — Z8673 Personal history of transient ischemic attack (TIA), and cerebral infarction without residual deficits: Secondary | ICD-10-CM | POA: Insufficient documentation

## 2021-07-10 LAB — CBC
HCT: 41.3 % (ref 36.0–46.0)
Hemoglobin: 13.4 g/dL (ref 12.0–15.0)
MCH: 27.1 pg (ref 26.0–34.0)
MCHC: 32.4 g/dL (ref 30.0–36.0)
MCV: 83.6 fL (ref 80.0–100.0)
Platelets: 297 10*3/uL (ref 150–400)
RBC: 4.94 MIL/uL (ref 3.87–5.11)
RDW: 14.8 % (ref 11.5–15.5)
WBC: 8.8 10*3/uL (ref 4.0–10.5)
nRBC: 0 % (ref 0.0–0.2)

## 2021-07-10 LAB — URINALYSIS, ROUTINE W REFLEX MICROSCOPIC
Bacteria, UA: NONE SEEN
Bilirubin Urine: NEGATIVE
Glucose, UA: NEGATIVE mg/dL
Hgb urine dipstick: NEGATIVE
Ketones, ur: NEGATIVE mg/dL
Leukocytes,Ua: NEGATIVE
Nitrite: NEGATIVE
Protein, ur: 100 mg/dL — AB
Specific Gravity, Urine: 1.028 (ref 1.005–1.030)
pH: 5 (ref 5.0–8.0)

## 2021-07-10 LAB — BASIC METABOLIC PANEL
Anion gap: 9 (ref 5–15)
BUN: 19 mg/dL (ref 6–20)
CO2: 24 mmol/L (ref 22–32)
Calcium: 10.2 mg/dL (ref 8.9–10.3)
Chloride: 107 mmol/L (ref 98–111)
Creatinine, Ser: 0.9 mg/dL (ref 0.44–1.00)
GFR, Estimated: >60 mL/min (ref >60)
Glucose, Bld: 142 mg/dL — ABNORMAL HIGH (ref 70–99)
Potassium: 3.5 mmol/L (ref 3.5–5.1)
Sodium: 140 mmol/L (ref 135–145)

## 2021-07-10 LAB — APTT: aPTT: 29 seconds (ref 24–36)

## 2021-07-10 MED ORDER — AMLODIPINE BESYLATE 5 MG PO TABS
10.0000 mg | ORAL_TABLET | Freq: Once | ORAL | Status: AC
  Administered 2021-07-10: 10 mg via ORAL
  Filled 2021-07-10: qty 2

## 2021-07-10 MED ORDER — HYDROCHLOROTHIAZIDE 25 MG PO TABS
25.0000 mg | ORAL_TABLET | Freq: Once | ORAL | Status: AC
  Administered 2021-07-10: 25 mg via ORAL
  Filled 2021-07-10: qty 1

## 2021-07-10 MED ORDER — IRBESARTAN 150 MG PO TABS
300.0000 mg | ORAL_TABLET | Freq: Every day | ORAL | Status: DC
  Administered 2021-07-10: 300 mg via ORAL
  Filled 2021-07-10: qty 2

## 2021-07-10 NOTE — ED Triage Notes (Signed)
Pt here with intermittent left hand tingling that started 2 days ago. Pt states she was told by her provider to come to the ED due to her bp being elevated. Pt denies and pain or any other symptoms.

## 2021-07-10 NOTE — ED Notes (Signed)
Patient transported to MRI 

## 2021-07-10 NOTE — ED Provider Notes (Signed)
[facility] Provider Note    Event Date/Time   First MD Initiated Contact with Patient 07/10/21 1133     (approximate)  History   Chief Complaint: Numbness  HPI  Claire Hansen is a 61 y.o. female with a past medical history of anxiety, diabetes, hypertension, hyperlipidemia, presents to the emergency department for left hand tingling.  According to the patient since Monday she has been experiencing intermittent tingling in her left hand.  Patient saw her neurologist today (patient has a history of a prior CVA with right hand symptoms at the time) and they sent her to the emergency department to rule out a stroke.  Patient would be outside of any TNK window.  Denies any tingling currently.  Denies any other symptoms.  No difficulty with speech no difficulty thinking no confusion no lower extremity weakness or numbness.  Patient states her blood pressure is high but she has not taken her morning blood pressure medications because often times they make her urinate and she had a doctor appointment today.  Physical Exam   Triage Vital Signs: ED Triage Vitals  Enc Vitals Group     BP 07/10/21 1030 (!) 198/103     Pulse Rate 07/10/21 1028 72     Resp 07/10/21 1028 18     Temp 07/10/21 1028 98.3 F (36.8 C)     Temp Source 07/10/21 1028 Oral     SpO2 07/10/21 1028 96 %     Weight 07/10/21 1029 235 lb 0.2 oz (106.6 kg)     Height 07/10/21 1029 '5\' 6"'$  (1.676 m)     Head Circumference --      Peak Flow --      Pain Score 07/10/21 1029 0     Pain Loc --      Pain Edu? --      Excl. in [place]? --     Most recent vital signs: Vitals:   07/10/21 1030 07/10/21 1134  BP: (!) 198/103 (!) 190/103  Pulse:  73  Resp:  16  Temp:    SpO2:  98%    General: Awake, no distress.  CV:  Good peripheral perfusion.  Regular rate and rhythm  Resp:  Normal effort.  Equal breath sounds bilaterally.  Abd:  No distention.  Soft, nontender.  No rebound or guarding. Other:  Equal  grip strength bilaterally.  5/5 strength in bilateral upper extremities with no pronator drift.  No cranial nerve deficits.   ED Results / Procedures / Treatments   EKG  EKG viewed and interpreted by myself shows a normal sinus rhythm at 71 bpm with a narrow QRS, normal axis, normal intervals, no concerning ST changes.  RADIOLOGY  I have interpreted the CT images no large bleed seen on my evaluation. Radiology has read the CT scan is negative for acute abnormality remote pontine infarct.  MEDICATIONS ORDERED IN ED: Medications - No data to display   IMPRESSION / MDM / [place] / ED COURSE  I reviewed the triage vital signs and the nursing notes.  Patient's presentation is most consistent with acute presentation with potential threat to life or bodily function.  Patient presents to the emergency department for intermittent left hand tingling over the past 4 days sent by her neurologist to rule out CVA.  Overall the patient appears well, no deficits on my examination.  We will check labs, CT scan EKG and continue to closely monitor.  Patient CT scan shows a  remote pontine infarct but no acute infarct.  However given the patient's intermittent left hand symptoms which seem fairly subtle we will obtain an MRI to rule out small stroke.  Patient agreeable to plan of care.  Blood pressure is elevated 190/103.  We will dose the patient's home blood pressure medications.  CBC is normal.  Chemistry is normal.  Urinalysis is normal. 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  Patient is requesting to go home.  She will follow-up with her neurologist.  Blood pressure 167/89 on last check.  FINAL CLINICAL IMPRESSION(S) / ED DIAGNOSES   Paresthesia Hypertension    Note:  This document was prepared using Dragon voice recognition software and may include unintentional dictation errors.   Harvest Dark, MD 07/10/21 1440

## 2021-07-10 NOTE — ED Notes (Signed)
Pt states she was sent from her neurologist office for CT due to HTN. Pt has not taken her HTN meds today. Endorses intermittent tingling to left arm.

## 2021-09-10 LAB — BASIC METABOLIC PANEL
BUN: 13 (ref 4–21)
CO2: 19 (ref 13–22)
Chloride: 103 (ref 99–108)
Creatinine: 1.1 (ref 0.5–1.1)
Glucose: 138
Potassium: 3.9 mEq/L (ref 3.5–5.1)
Sodium: 143 (ref 137–147)

## 2021-09-10 LAB — CBC AND DIFFERENTIAL
HCT: 42 (ref 36–46)
Hemoglobin: 14.5 (ref 12.0–16.0)
Neutrophils Absolute: 5.4
Platelets: 310 10*3/uL (ref 150–400)
WBC: 8.7

## 2021-09-10 LAB — COMPREHENSIVE METABOLIC PANEL
Albumin: 5 (ref 3.5–5.0)
Calcium: 10.4 (ref 8.7–10.7)
Globulin: 2.8
eGFR: 61

## 2021-09-10 LAB — LIPID PANEL
Cholesterol: 194 (ref 0–200)
HDL: 42 (ref 35–70)
LDL Cholesterol: 113
Triglycerides: 226 — AB (ref 40–160)

## 2021-09-10 LAB — TSH: TSH: 0.39 — AB (ref 0.41–5.90)

## 2021-09-10 LAB — HEPATIC FUNCTION PANEL
ALT: 20 U/L (ref 7–35)
AST: 15 (ref 13–35)
Alkaline Phosphatase: 135 — AB (ref 25–125)
Bilirubin, Total: 0.3

## 2021-09-10 LAB — CBC: RBC: 5.23 — AB (ref 3.87–5.11)

## 2021-09-10 LAB — HEMOGLOBIN A1C: Hemoglobin A1C: 7.2

## 2021-09-10 LAB — AMB RESULTS CONSOLE CBG: Glucose: 140

## 2021-10-20 ENCOUNTER — Ambulatory Visit
Admission: RE | Admit: 2021-10-20 | Discharge: 2021-10-20 | Disposition: A | Discharge status: Home / Self Care | Payer: Self-pay | Source: Ambulatory Visit | Attending: Neurosurgery | Admitting: Neurosurgery

## 2021-10-20 ENCOUNTER — Other Ambulatory Visit: Payer: Self-pay

## 2021-10-20 DIAGNOSIS — Z049 Encounter for examination and observation for unspecified reason: Secondary | ICD-10-CM

## 2021-10-29 LAB — HM DIABETES EYE EXAM

## 2021-11-07 ENCOUNTER — Ambulatory Visit: Payer: Self-pay | Admitting: Orthopedic Surgery

## 2022-03-04 ENCOUNTER — Encounter: Payer: Self-pay | Admitting: Family

## 2022-03-04 ENCOUNTER — Ambulatory Visit: Payer: Managed Care, Other (non HMO) | Admitting: Family

## 2022-03-04 VITALS — BP 134/78 | HR 104 | Ht 65.0 in | Wt 263.0 lb

## 2022-03-04 DIAGNOSIS — E782 Mixed hyperlipidemia: Secondary | ICD-10-CM

## 2022-03-04 DIAGNOSIS — R197 Diarrhea, unspecified: Secondary | ICD-10-CM | POA: Diagnosis not present

## 2022-03-04 DIAGNOSIS — R61 Generalized hyperhidrosis: Secondary | ICD-10-CM

## 2022-03-04 DIAGNOSIS — E1165 Type 2 diabetes mellitus with hyperglycemia: Secondary | ICD-10-CM

## 2022-03-04 DIAGNOSIS — R809 Proteinuria, unspecified: Secondary | ICD-10-CM

## 2022-03-04 DIAGNOSIS — E1129 Type 2 diabetes mellitus with other diabetic kidney complication: Secondary | ICD-10-CM

## 2022-03-04 DIAGNOSIS — I10 Essential (primary) hypertension: Secondary | ICD-10-CM

## 2022-03-04 DIAGNOSIS — R946 Abnormal results of thyroid function studies: Secondary | ICD-10-CM

## 2022-03-04 DIAGNOSIS — R5082 Postprocedural fever: Secondary | ICD-10-CM | POA: Insufficient documentation

## 2022-03-04 DIAGNOSIS — Z794 Long term (current) use of insulin: Secondary | ICD-10-CM | POA: Diagnosis not present

## 2022-03-04 DIAGNOSIS — Z6841 Body Mass Index (BMI) 40.0 and over, adult: Secondary | ICD-10-CM

## 2022-03-04 DIAGNOSIS — E559 Vitamin D deficiency, unspecified: Secondary | ICD-10-CM

## 2022-03-04 DIAGNOSIS — R42 Dizziness and giddiness: Secondary | ICD-10-CM

## 2022-03-04 DIAGNOSIS — R232 Flushing: Secondary | ICD-10-CM

## 2022-03-04 DIAGNOSIS — E119 Type 2 diabetes mellitus without complications: Secondary | ICD-10-CM | POA: Insufficient documentation

## 2022-03-04 DIAGNOSIS — E538 Deficiency of other specified B group vitamins: Secondary | ICD-10-CM

## 2022-03-04 HISTORY — DX: Type 2 diabetes mellitus without complications: E11.9

## 2022-03-04 HISTORY — DX: Postprocedural fever: R50.82

## 2022-03-04 HISTORY — DX: Abnormal results of thyroid function studies: R94.6

## 2022-03-04 LAB — POCT UA - MICROALBUMIN

## 2022-03-04 LAB — POCT URINALYSIS DIPSTICK
Bilirubin, UA: NEGATIVE
Blood, UA: 10 — AB
Glucose, UA: POSITIVE — AB
Ketones, UA: NEGATIVE
Leukocytes, UA: NEGATIVE
Nitrite, UA: NEGATIVE
Protein, UA: POSITIVE — AB
Spec Grav, UA: 1.015 (ref 1.010–1.025)
Urobilinogen, UA: 0.2 E.U./dL
pH, UA: 6 (ref 5.0–8.0)

## 2022-03-04 NOTE — Progress Notes (Signed)
Established Patient Office Visit  Subjective   Patient ID: Claire Hansen, female    DOB: 09-01-1960  Age: 62 y.o. MRN: [id number]  Chief Complaint  Patient presents with   Follow-up    Abdominal pain    Abdominal Pain This is a new problem. The current episode started 1 to 4 weeks ago. The onset quality is gradual. The problem occurs daily. The problem has been unchanged. The pain is located in the generalized abdominal region. Associated symptoms include diarrhea. Pertinent negatives include no constipation, nausea or vomiting. The pain is aggravated by eating. The pain is relieved by Nothing. The treatment provided no relief.  Diarrhea  This is a new problem. The current episode started 1 to 4 weeks ago. The problem occurs 5 to 10 times per day. The problem has been unchanged. The stool consistency is described as Watery. The patient states that diarrhea does not awaken her from sleep. Associated symptoms include abdominal pain, bloating, chills and sweats. Pertinent negatives include no vomiting. Nothing aggravates the symptoms. There are no known risk factors. She has tried anti-motility drug for the symptoms. The treatment provided mild relief.     Past Medical History:  Diagnosis Date   Allergy    Anxiety    Asthma    pt denies having asthma   Chronic osteoarthritis    knees   Deviated septum    Diabetes mellitus without complication (HCC)    Hyperlipidemia    Hypertension    Irregular menstrual cycle    Lymphadenopathy    Obesity    Peri-menopause    Pneumonia 2015    Social History   Socioeconomic History   Marital status: Married    Spouse name: Not on file   Number of children: Not on file   Years of education: Not on file   Highest education level: Not on file  Occupational History   Not on file  Tobacco Use   Smoking status: Never   Smokeless tobacco: Never  Vaping Use   Vaping Use: Never used  Substance and Sexual Activity   Alcohol use: No     Alcohol/week: 0.0 standard drinks of alcohol   Drug use: No   Sexual activity: Yes    Partners: Male  Other Topics Concern   Not on file  Social History Narrative   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    1.5 yrs college    Caffeine-    Exercises 3 x weekly    Social Determinants of Health   Financial Resource Strain: Not on file  Food Insecurity: Not on file  Transportation Needs: Not on file  Physical Activity: Not on file  Stress: Not on file  Social Connections: Not on file  Intimate Partner Violence: Not on file    Family History  Problem Relation Age of Onset   Asthma Son     Allergies  Allergen Reactions   Shellfish Allergy Anaphylaxis and Nausea And Vomiting    Topical betadine is OK to use.    Review of Systems  Constitutional:  Positive for chills.  Gastrointestinal:  Positive for abdominal pain, bloating and diarrhea. Negative for constipation, nausea and vomiting.       Objective:     BP 134/78   Pulse (!) 104   Ht '5\' 5"'$  (1.651 m)   LMP 06/04/2016   SpO2 98%   BMI 39.11 kg/m   Vitals:   03/04/22 1016  BP: 134/78  Pulse: Marland Kitchen)  104  Height: '5\' 5"'$  (1.651 m)  SpO2: 98%    Physical Exam Constitutional:      Appearance: Normal appearance. She is obese.  Abdominal:     General: Abdomen is protuberant. Bowel sounds are increased.     Palpations: Abdomen is soft.  Neurological:     Mental Status: She is alert.      Results for orders placed or performed in visit on 03/04/22  HM MAMMOGRAPHY  Result Value Ref Range   HM Mammogram 0-4 Bi-Rad 0-4 Bi-Rad, Self Reported Normal  CBC and differential  Result Value Ref Range   Hemoglobin 14.5 12.0 - 16.0   HCT 42 36 - 46   Neutrophils Absolute 5.40    Platelets 310 150 - 400 K/uL   WBC 8.7   CBC  Result Value Ref Range   RBC 5.23 (A) 3.87 - 5.11  CBG  Result Value Ref Range   Glucose 229   Basic metabolic panel  Result Value Ref Range   Glucose 138    BUN 13 4 - 21   CO2 19 13  - 22   Creatinine 1.1 0.5 - 1.1   Potassium 3.9 3.5 - 5.1 mEq/L   Sodium 143 137 - 147   Chloride 103 99 - 108  Comprehensive metabolic panel  Result Value Ref Range   Globulin 2.8    eGFR 61    Calcium 10.4 8.7 - 10.7   Albumin 5.0 3.5 - 5.0  Lipid panel  Result Value Ref Range   Triglycerides 226 (A) 40 - 160   Cholesterol 194 0 - 200   HDL 42 35 - 70   LDL Cholesterol 113   Hepatic function panel  Result Value Ref Range   Alkaline Phosphatase 135 (A) 25 - 125   ALT 20 7 - 35 U/L   AST 15 13 - 35   Bilirubin, Total 0.3   Hemoglobin A1c  Result Value Ref Range   Hemoglobin A1C 7.2   TSH  Result Value Ref Range   TSH 0.39 (A) 0.41 - 5.90  POCT UA - Microalbumin  Result Value Ref Range   Microalbumin Ur, POC '150mg'$ /DL mg/L   Creatinine, POC '100mg'$ /DL mg/dL   Albumin/Creatinine Ratio, Urine, POC >'300mg'$    POCT Urinalysis Dipstick  Result Value Ref Range   Color, UA     Clarity, UA     Glucose, UA Positive (A) Negative   Bilirubin, UA negative    Ketones, UA negative    Spec Grav, UA 1.015 1.010 - 1.025   Blood, UA 10 (A)    pH, UA 6.0 5.0 - 8.0   Protein, UA Positive (A) Negative   Urobilinogen, UA 0.2 0.2 or 1.0 E.U./dL   Nitrite, UA negative    Leukocytes, UA Negative Negative   Appearance     Odor    HM DIABETES EYE EXAM  Result Value Ref Range   HM Diabetic Eye Exam No Retinopathy No Retinopathy       Assessment & Plan:   Problem List Items Addressed This Visit     Hyperlipidemia    Checking labwork today - will call with results.   Will look into alternatives for cholesterol meds for her.       Relevant Medications   metoprolol tartrate (LOPRESSOR) 25 MG tablet   hydrALAZINE (APRESOLINE) 10 MG tablet   Other Relevant Orders   Lipid panel   BMI 45.0-49.9, adult ([place])   Poorly controlled diabetes mellitus ([place])  A1C today.  Sending refills for Rybelsus and insulin.       Abnormal results of thyroid function studies    Rechecking thyroid  labs today.   Patient has already got an appointment in a few months with endocrine.       Relevant Orders   POCT Urinalysis Dipstick (Completed)   TSH   B12 deficiency due to diet   Relevant Orders   Vitamin B12   Vitamin D deficiency, unspecified   Relevant Orders   VITAMIN D 25 Hydroxy (Vit-D Deficiency, Fractures)   Essential hypertension    Continue current medications for blood pressure control.        Relevant Medications   metoprolol tartrate (LOPRESSOR) 25 MG tablet   hydrALAZINE (APRESOLINE) 10 MG tablet   Hypercalcemia   Other Visit Diagnoses     Diarrhea, unspecified type    -  Primary   Will get stool tests for pt.  I have also asked her to stop her metformin again, as she has had this issue before with the metformin.   Relevant Orders   Pancreatic elastase, fecal   Culture, Stool   Norovirus group 1 & 2 by PCR   GI Profile, Stool, PCR- [facility]   Dizziness       Relevant Orders   Pancreatic elastase, fecal   Culture, Stool   Norovirus group 1 & 2 by PCR   GI Profile, Stool, PCR- [facility]   Abnormal flushing and sweating       Relevant Orders   Pancreatic elastase, fecal   Culture, Stool   Norovirus group 1 & 2 by PCR   GI Profile, Stool, PCR- [facility]       No follow-ups on file.   Total time spent: 40 minutes   [place], FNP

## 2022-03-04 NOTE — Assessment & Plan Note (Signed)
A1C today.  Sending refills for Rybelsus and insulin.

## 2022-03-04 NOTE — Assessment & Plan Note (Signed)
Checking labwork today - will call with results.   Will look into alternatives for cholesterol meds for her.

## 2022-03-04 NOTE — Assessment & Plan Note (Addendum)
Rechecking thyroid labs today.   Patient has already got an appointment in a few months with endocrine.

## 2022-03-04 NOTE — Assessment & Plan Note (Signed)
Continue current medications for blood pressure control.

## 2022-03-04 NOTE — Assessment & Plan Note (Signed)
>>  ASSESSMENT AND PLAN FOR POORLY CONTROLLED DIABETES MELLITUS (HCC) WRITTEN ON 03/04/2022  3:29 PM BY Bryden Darden M, FNP  A1C today.  Sending refills for Rybelsus and insulin.

## 2022-03-05 LAB — CMP14+EGFR
ALT: 32 IU/L (ref 0–32)
AST: 26 IU/L (ref 0–40)
Albumin/Globulin Ratio: 1.7 (ref 1.2–2.2)
Albumin: 4.7 g/dL (ref 3.8–4.9)
Alkaline Phosphatase: 116 IU/L (ref 44–121)
BUN/Creatinine Ratio: 13 (ref 9–23)
BUN: 13 mg/dL (ref 6–24)
Bilirubin Total: 0.3 mg/dL (ref 0.0–1.2)
CO2: 19 mmol/L — ABNORMAL LOW (ref 20–29)
Calcium: 10.5 mg/dL — ABNORMAL HIGH (ref 8.7–10.2)
Chloride: 98 mmol/L (ref 96–106)
Creatinine, Ser: 0.98 mg/dL (ref 0.57–1.00)
Globulin, Total: 2.8 g/dL (ref 1.5–4.5)
Glucose: 232 mg/dL — ABNORMAL HIGH (ref 70–99)
Potassium: 4 mmol/L (ref 3.5–5.2)
Sodium: 138 mmol/L (ref 134–144)
Total Protein: 7.5 g/dL (ref 6.0–8.5)
eGFR: 66 mL/min/{1.73_m2} (ref >59)

## 2022-03-05 LAB — CBC WITH DIFFERENTIAL
Basophils Absolute: 0 10*3/uL (ref 0.0–0.2)
Basos: 0 %
EOS (ABSOLUTE): 0.2 10*3/uL (ref 0.0–0.4)
Eos: 3 %
Hematocrit: 42.8 % (ref 34.0–46.6)
Hemoglobin: 14.2 g/dL (ref 11.1–15.9)
Immature Grans (Abs): 0 10*3/uL (ref 0.0–0.1)
Immature Granulocytes: 0 %
Lymphocytes Absolute: 2.2 10*3/uL (ref 0.7–3.1)
Lymphs: 30 %
MCH: 27.4 pg (ref 26.6–33.0)
MCHC: 33.2 g/dL (ref 31.5–35.7)
MCV: 83 fL (ref 79–97)
Monocytes Absolute: 0.3 10*3/uL (ref 0.1–0.9)
Monocytes: 5 %
Neutrophils Absolute: 4.8 10*3/uL (ref 1.4–7.0)
Neutrophils: 62 %
RBC: 5.18 x10E6/uL (ref 3.77–5.28)
RDW: 15.3 % (ref 11.7–15.4)
WBC: 7.6 10*3/uL (ref 3.4–10.8)

## 2022-03-05 LAB — LIPID PANEL
Chol/HDL Ratio: 6.2 ratio — ABNORMAL HIGH (ref 0.0–4.4)
Cholesterol, Total: 284 mg/dL — ABNORMAL HIGH (ref 100–199)
HDL: 46 mg/dL (ref >39)
LDL Chol Calc (NIH): 183 mg/dL — ABNORMAL HIGH (ref 0–99)
Triglycerides: 283 mg/dL — ABNORMAL HIGH (ref 0–149)
VLDL Cholesterol Cal: 55 mg/dL — ABNORMAL HIGH (ref 5–40)

## 2022-03-05 LAB — TSH: TSH: 0.999 u[IU]/mL (ref 0.450–4.500)

## 2022-03-05 LAB — HEMOGLOBIN A1C
Est. average glucose Bld gHb Est-mCnc: 197 mg/dL
Hgb A1c MFr Bld: 8.5 % — ABNORMAL HIGH (ref 4.8–5.6)

## 2022-03-05 LAB — VITAMIN D 25 HYDROXY (VIT D DEFICIENCY, FRACTURES): Vit D, 25-Hydroxy: 22 ng/mL — ABNORMAL LOW (ref 30.0–100.0)

## 2022-03-09 LAB — PANCREATIC ELASTASE, FECAL: Pancreatic Elastase, Fecal: 448 ug Elast./g (ref >200)

## 2022-03-09 LAB — GI PROFILE, STOOL, PCR

## 2022-03-09 LAB — NOROVIRUS GROUP 1 & 2 BY PCR, STOOL
Norovirus 1 by PCR: NEGATIVE
Norovirus 2  by PCR: NEGATIVE

## 2022-03-09 LAB — STOOL CULTURE: E coli, Shiga toxin Assay: NEGATIVE

## 2022-04-01 ENCOUNTER — Encounter: Payer: Self-pay | Admitting: Family

## 2022-04-01 ENCOUNTER — Ambulatory Visit: Payer: Managed Care, Other (non HMO) | Admitting: Family

## 2022-04-01 VITALS — BP 130/78 | HR 95 | Ht 65.0 in | Wt 237.0 lb

## 2022-04-01 DIAGNOSIS — E1122 Type 2 diabetes mellitus with diabetic chronic kidney disease: Secondary | ICD-10-CM

## 2022-04-01 DIAGNOSIS — Z6841 Body Mass Index (BMI) 40.0 and over, adult: Secondary | ICD-10-CM

## 2022-04-01 DIAGNOSIS — E040 Nontoxic diffuse goiter: Secondary | ICD-10-CM

## 2022-04-01 NOTE — Progress Notes (Signed)
'  Established Patient Office Visit  Subjective:  Patient ID: Claire Hansen, female    DOB: 10/31/1960  Age: 62 y.o. MRN: [id number]  Chief Complaint  Patient presents with   Follow-up    1 month follow up    1 month f/u  Patient is feeling much better from her previous appointment.  Her diarrhea has resolved, and she says she is taking all of her meds again.  She has been taking the cholesterol and her metformin again.   Asks if we can get her set up with someone else for her parathyroid and her goiter, as she did hear from [facility] and they did not have any appointments until September.   Is doing well otherwise.   No other concerns today.      Past Medical History:  Diagnosis Date   Allergy    Anxiety    Asthma    pt denies having asthma   Chronic osteoarthritis    knees   Deviated septum    Diabetes mellitus type 2, uncomplicated ([place]) [id number]   Diabetes mellitus without complication ([place])    Fever postop 03/04/2022   Hyperlipidemia    Hypertension    Irregular menstrual cycle    Lymphadenopathy    Obesity    Peri-menopause    Pneumonia 2015   Post-operative state 01/10/2021    Past Surgical History:  Procedure Laterality Date   BACK SURGERY  07/2020   BREAST SURGERY Right    Cyst removed   CESAREAN SECTION     x2   CHOLECYSTECTOMY     CYSTOSCOPY  01/10/2021   Procedure: CYSTOSCOPY;  Surgeon: Schermerhorn, Gwen Her, MD;  Location: [facility];  Service: Gynecology;;   DILATION AND CURETTAGE OF UTERUS     Treatment of Metorrhagia and Endometrial Ablation   PERONEAL NERVE DECOMPRESSION Left 04/29/2020   Procedure: PERONEAL NERVE DECOMPRESSION;  Surgeon: Deetta Perla, MD;  Location: [facility];  Service: Neurosurgery;  Laterality: Left;   SUPRACERVICAL ABDOMINAL HYSTERECTOMY Bilateral 01/10/2021   Procedure: HYSTERECTOMY ABDOMINAL WITH  BILATERA SALPINGO OOPHORECTOMY;  Surgeon: Schermerhorn, Gwen Her, MD;  Location: [facility];  Service: Gynecology;   Laterality: Bilateral;   TONSILLECTOMY     TOTAL HIP ARTHROPLASTY Right 11/05/2016   Procedure: TOTAL HIP ARTHROPLASTY ANTERIOR APPROACH;  Surgeon: Hessie Knows, MD;  Location: [facility];  Service: Orthopedics;  Laterality: Right;   TOTAL HIP ARTHROPLASTY Left 07/01/2017   Procedure: TOTAL HIP ARTHROPLASTY ANTERIOR APPROACH;  Surgeon: Hessie Knows, MD;  Location: [facility];  Service: Orthopedics;  Laterality: Left;   TOTAL HIP REVISION Left 05/03/2020   Procedure: LEFT FEMORAL COMPONENT REVISION;  Surgeon: Hessie Knows, MD;  Location: [facility];  Service: Orthopedics;  Laterality: Left;    Social History   Socioeconomic History   Marital status: Married    Spouse name: Not on file   Number of children: Not on file   Years of education: Not on file   Highest education level: Not on file  Occupational History   Not on file  Tobacco Use   Smoking status: Never   Smokeless tobacco: Never  Vaping Use   Vaping Use: Never used  Substance and Sexual Activity   Alcohol use: No    Alcohol/week: 0.0 standard drinks of alcohol   Drug use: No   Sexual activity: Yes    Partners: Male  Other Topics Concern   Not on file  Social History Narrative   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   Statins Other (See Comments)    Muscle pain/aches, can tolerate rosuvastatin    Review of Systems  All other systems reviewed and are negative.      Objective:   BP 130/78    Pulse 95   Ht '5\' 5"'$  (1.651 m)   Wt 237 lb (107.5 kg)   LMP 06/04/2016   SpO2 97%   BMI 39.44 kg/m   Vitals:   04/01/22 0900 04/01/22 0930  BP: (!) 150/92 130/78  Pulse: 95   Height: '5\' 5"'$  (1.651 m)   Weight: 237 lb (107.5 kg)   SpO2: 97%   BMI (Calculated): 39.44     Physical Exam Vitals and nursing note reviewed.  Constitutional:      Appearance: Normal appearance. She is normal weight.  HENT:     Head: Normocephalic.  Eyes:     Pupils: Pupils are equal, round, and reactive to light.  Cardiovascular:     Rate and Rhythm: Normal rate.  Pulmonary:     Effort: Pulmonary effort is normal.  Neurological:     Mental Status: She is alert and oriented to person, place, and time. Mental status is at baseline.     Gait: Gait abnormal.      No results found for any visits on 04/01/22.  Recent Results (from the past 2160 hour(s))  POCT UA - Microalbumin     Status: Abnormal   Collection Time: 03/04/22 10:34 AM  Result Value Ref Range   Microalbumin Ur, POC '150mg'$ /DL mg/L   Creatinine, POC '100mg'$ /DL mg/dL   Albumin/Creatinine Ratio, Urine, POC >'300mg'$    POCT Urinalysis Dipstick     Status: Abnormal   Collection Time: 03/04/22 10:34 AM  Result Value Ref Range   Color, UA     Clarity, UA     Glucose, UA Positive (A) Negative    Comment: '250mg'$    Bilirubin, UA negative    Ketones, UA negative    Spec Grav, UA 1.015 1.010 - 1.025   Blood, UA 10 (A)    pH, UA 6.0 5.0 - 8.0   Protein, UA Positive (A) Negative    Comment: '100mg'$    Urobilinogen, UA 0.2 0.2 or 1.0 E.U./dL   Nitrite, UA negative    Leukocytes, UA Negative Negative   Appearance     Odor    Lipid panel     Status: Abnormal   Collection Time: 03/04/22 11:12 AM  Result Value Ref Range   Cholesterol, Total 284 (H) 100 - 199 mg/dL   Triglycerides 283 (H) 0 - 149 mg/dL   HDL 46 >39 mg/dL   VLDL Cholesterol Cal 55 (H) 5 - 40 mg/dL   LDL Chol Calc (NIH) 183 (H) 0 - 99 mg/dL   Chol/HDL Ratio 6.2 (H) 0.0 - 4.4  ratio    Comment:  T. Chol/HDL Ratio                                             Men  Women                               1/2 Avg.Risk  3.4    3.3                                   Avg.Risk  5.0    4.4                                2X Avg.Risk  9.6    7.1                                3X Avg.Risk 23.4   11.0   VITAMIN D 25 Hydroxy (Vit-D Deficiency, Fractures)     Status: Abnormal   Collection Time: 03/04/22 11:12 AM  Result Value Ref Range   Vit D, 25-Hydroxy 22.0 (L) 30.0 - 100.0 ng/mL    Comment: Vitamin D deficiency has been defined by the [place] practice guideline as a level of serum 25-OH vitamin D less than 20 ng/mL (1,2). The Endocrine Society went on to further define vitamin D insufficiency as a level between 21 and 29 ng/mL (2). 1. IOM (Institute of Medicine). 2010. Dietary reference    intakes for calcium and D. [place]: The    [employer]. 2. Holick MF, Binkley Pearson, Bischoff-Ferrari HA, et al.    Evaluation, treatment, and prevention of vitamin D    deficiency: an Endocrine Society clinical practice    guideline. JCEM. 2011 Jul; 96(7):1911-30.   CBC With Differential     Status: None   Collection Time: 03/04/22 11:12 AM  Result Value Ref Range   WBC 7.6 3.4 - 10.8 x10E3/uL   RBC 5.18 3.77 - 5.28 x10E6/uL   Hemoglobin 14.2 11.1 - 15.9 g/dL   Hematocrit 42.8 34.0 - 46.6 %   MCV 83 79 - 97 fL   MCH 27.4 26.6 - 33.0 pg   MCHC 33.2 31.5 - 35.7 g/dL   RDW 15.3 11.7 - 15.4 %   Neutrophils 62 Not Estab. %   Lymphs 30 Not Estab. %   Monocytes 5 Not Estab. %   Eos 3 Not Estab. %   Basos 0 Not Estab. %   Neutrophils Absolute 4.8 1.4 - 7.0 x10E3/uL   Lymphocytes Absolute 2.2 0.7 - 3.1 x10E3/uL   Monocytes Absolute 0.3 0.1 - 0.9 x10E3/uL   EOS (ABSOLUTE) 0.2 0.0 - 0.4 x10E3/uL   Basophils Absolute 0.0 0.0 - 0.2 x10E3/uL   Immature Granulocytes 0 Not Estab. %   Immature Grans  (Abs) 0.0 0.0 - 0.1 x10E3/uL  CMP14+EGFR     Status: Abnormal   Collection Time: 03/04/22 11:12 AM  Result Value Ref Range   Glucose 232 (H) 70 - 99 mg/dL   BUN 13 6 - 24 mg/dL   Creatinine, Ser 0.98 0.57 - 1.00 mg/dL   eGFR 66 >59 mL/min/1.73   BUN/Creatinine Ratio  13 9 - 23   Sodium 138 134 - 144 mmol/L   Potassium 4.0 3.5 - 5.2 mmol/L   Chloride 98 96 - 106 mmol/L   CO2 19 (L) 20 - 29 mmol/L   Calcium 10.5 (H) 8.7 - 10.2 mg/dL   Total Protein 7.5 6.0 - 8.5 g/dL   Albumin 4.7 3.8 - 4.9 g/dL   Globulin, Total 2.8 1.5 - 4.5 g/dL   Albumin/Globulin Ratio 1.7 1.2 - 2.2   Bilirubin Total 0.3 0.0 - 1.2 mg/dL   Alkaline Phosphatase 116 44 - 121 IU/L   AST 26 0 - 40 IU/L   ALT 32 0 - 32 IU/L  TSH     Status: None   Collection Time: 03/04/22 11:12 AM  Result Value Ref Range   TSH 0.999 0.450 - 4.500 uIU/mL  Hemoglobin A1c     Status: Abnormal   Collection Time: 03/04/22 11:12 AM  Result Value Ref Range   Hgb A1c MFr Bld 8.5 (H) 4.8 - 5.6 %    Comment:          Prediabetes: 5.7 - 6.4          Diabetes: >6.4          Glycemic control for adults with diabetes: <7.0    Est. average glucose Bld gHb Est-mCnc 197 mg/dL  Culture, Stool     Status: None   Collection Time: 03/05/22 11:44 AM   Specimen: Stool   ST  Result Value Ref Range   Salmonella/Shigella Screen Final report    Stool Culture result 1 (RSASHR) Comment     Comment: No Salmonella or Shigella recovered.   Campylobacter Culture Final report    Stool Culture result 1 (CMPCXR) Comment     Comment: No Campylobacter species isolated.   E coli, Shiga toxin Assay Negative Negative  GI Profile, Stool, PCR     Status: None   Collection Time: 03/05/22 11:44 AM  Result Value Ref Range   Campylobacter Not Detected Not Detected   C difficile toxin A/B Not Detected Not Detected   Plesiomonas shigelloides Not Detected Not Detected   Salmonella Not Detected Not Detected   Vibrio Not Detected Not Detected   Vibrio cholerae Not  Detected Not Detected   Yersinia enterocolitica Not Detected Not Detected   Enteroaggregative E coli Not Detected Not Detected   Enteropathogenic E coli Not Detected Not Detected   Enterotoxigenic E coli Not Detected Not Detected   Shiga-toxin-producing E coli Not Detected Not Detected   E coli [id number] Not applicable Not Detected   Shigella/Enteroinvasive E coli Not Detected Not Detected   Cryptosporidium Not Detected Not Detected   Cyclospora cayetanensis Not Detected Not Detected   Entamoeba histolytica Not Detected Not Detected   Giardia lamblia Not Detected Not Detected   Adenovirus F 40/41 Not Detected Not Detected   Astrovirus Not Detected Not Detected   Norovirus GI/GII Not Detected Not Detected   Rotavirus A Not Detected Not Detected   Sapovirus Not Detected Not Detected  Norovirus group 1 & 2 by PCR, stool     Status: None   Collection Time: 03/05/22 11:44 AM  Result Value Ref Range   Norovirus 1 by PCR Negative Negative   Norovirus 2  by PCR Negative Negative    Comment: This test was developed and its performance characteristics determined by [facility].  It has not been cleared or approved by the Food and Drug Administration.  The FDA has determined that such clearance  or approval is not necessary.   Pancreatic elastase, fecal     Status: None   Collection Time: 03/05/22 11:44 AM  Result Value Ref Range   Pancreatic Elastase, Fecal 448 >200 ug Elast./g    Comment:        Severe Pancreatic Insufficiency:          <100        Moderate Pancreatic Insufficiency:   100 - 200        Normal:                                   >200       Assessment & Plan:   Problem List Items Addressed This Visit     BMI 45.0-49.9, adult (HCC)   Relevant Medications   insulin lispro (HUMALOG) 100 UNIT/ML injection   Type 2 diabetes mellitus with chronic kidney disease, without long-term current use of insulin, unspecified CKD stage (HCC) - Primary   Relevant Medications    Olmesartan-amLODIPine-HCTZ (TRIBENZOR) 40-10-25 MG TABS   insulin lispro (HUMALOG) 100 UNIT/ML injection   Other Visit Diagnoses     Goiter diffuse, nontoxic       Relevant Orders   Ambulatory referral to Endocrinology       Return in about 2 months (around 06/01/2022).   Total time spent: 20 minutes  Mechele Claude, FNP  04/01/2022

## 2022-04-07 ENCOUNTER — Encounter: Payer: Self-pay | Admitting: Family

## 2022-04-07 ENCOUNTER — Other Ambulatory Visit: Payer: Self-pay

## 2022-04-07 DIAGNOSIS — J3089 Other allergic rhinitis: Secondary | ICD-10-CM

## 2022-04-07 MED ORDER — MONTELUKAST SODIUM 10 MG PO TABS: 10.0000 mg | ORAL_TABLET | Freq: Every day | ORAL | Status: DC | PRN

## 2022-04-07 MED ORDER — METOPROLOL SUCCINATE ER 25 MG PO TB24: 25.0000 mg | ORAL_TABLET | Freq: Every day | ORAL | 3 refills | Status: DC

## 2022-04-07 MED ORDER — GABAPENTIN 300 MG PO CAPS: ORAL_CAPSULE | ORAL | 1 refills | Status: DC

## 2022-04-12 ENCOUNTER — Encounter: Payer: Self-pay | Admitting: Family

## 2022-04-13 ENCOUNTER — Other Ambulatory Visit: Payer: Self-pay

## 2022-04-13 DIAGNOSIS — E1122 Type 2 diabetes mellitus with diabetic chronic kidney disease: Secondary | ICD-10-CM

## 2022-04-13 MED ORDER — CONTOUR NEXT TEST VI STRP: ORAL_STRIP | 3 refills | Status: DC

## 2022-04-20 ENCOUNTER — Other Ambulatory Visit: Payer: Self-pay

## 2022-04-20 DIAGNOSIS — E1122 Type 2 diabetes mellitus with diabetic chronic kidney disease: Secondary | ICD-10-CM

## 2022-04-20 MED ORDER — CONTOUR NEXT TEST VI STRP: ORAL_STRIP | 3 refills | Status: DC

## 2022-04-27 ENCOUNTER — Encounter: Payer: Self-pay | Admitting: Family

## 2022-04-27 DIAGNOSIS — J3089 Other allergic rhinitis: Secondary | ICD-10-CM

## 2022-04-27 MED ORDER — MONTELUKAST SODIUM 10 MG PO TABS: 10.0000 mg | ORAL_TABLET | Freq: Every day | ORAL | 3 refills | Status: DC | PRN

## 2022-05-15 ENCOUNTER — Encounter: Payer: Self-pay | Admitting: Family

## 2022-05-20 ENCOUNTER — Other Ambulatory Visit: Payer: Self-pay | Admitting: Family

## 2022-05-20 ENCOUNTER — Encounter: Payer: Self-pay | Admitting: Family

## 2022-05-20 DIAGNOSIS — Z1231 Encounter for screening mammogram for malignant neoplasm of breast: Secondary | ICD-10-CM

## 2022-05-20 MED ORDER — TRAMADOL HCL 50 MG PO TABS: 50.0000 mg | ORAL_TABLET | Freq: Two times a day (BID) | ORAL | 3 refills | Status: DC | PRN

## 2022-05-28 ENCOUNTER — Encounter: Payer: Self-pay | Admitting: Family

## 2022-05-28 ENCOUNTER — Other Ambulatory Visit: Payer: Self-pay | Admitting: Internal Medicine

## 2022-05-28 DIAGNOSIS — E041 Nontoxic single thyroid nodule: Secondary | ICD-10-CM

## 2022-05-28 DIAGNOSIS — E049 Nontoxic goiter, unspecified: Secondary | ICD-10-CM

## 2022-06-03 ENCOUNTER — Encounter: Payer: Self-pay | Admitting: Family

## 2022-06-03 ENCOUNTER — Ambulatory Visit: Payer: Managed Care, Other (non HMO) | Admitting: Family

## 2022-06-03 VITALS — BP 130/90 | HR 88 | Ht 65.0 in | Wt 256.0 lb

## 2022-06-03 DIAGNOSIS — I10 Essential (primary) hypertension: Secondary | ICD-10-CM

## 2022-06-03 DIAGNOSIS — E1122 Type 2 diabetes mellitus with diabetic chronic kidney disease: Secondary | ICD-10-CM

## 2022-06-03 DIAGNOSIS — E1165 Type 2 diabetes mellitus with hyperglycemia: Secondary | ICD-10-CM

## 2022-06-03 DIAGNOSIS — E049 Nontoxic goiter, unspecified: Secondary | ICD-10-CM

## 2022-06-03 DIAGNOSIS — E538 Deficiency of other specified B group vitamins: Secondary | ICD-10-CM | POA: Diagnosis not present

## 2022-06-03 DIAGNOSIS — E782 Mixed hyperlipidemia: Secondary | ICD-10-CM | POA: Diagnosis not present

## 2022-06-03 DIAGNOSIS — E559 Vitamin D deficiency, unspecified: Secondary | ICD-10-CM | POA: Diagnosis not present

## 2022-06-03 DIAGNOSIS — R5383 Other fatigue: Secondary | ICD-10-CM

## 2022-06-03 MED ORDER — AMOXICILLIN-POT CLAVULANATE 875-125 MG PO TABS: 1.0000 | ORAL_TABLET | Freq: Two times a day (BID) | ORAL | 0 refills | Status: DC

## 2022-06-03 MED ORDER — FLUCONAZOLE 150 MG PO TABS: 150.0000 mg | ORAL_TABLET | Freq: Every day | ORAL | 0 refills | Status: DC

## 2022-06-03 MED ORDER — OFLOXACIN 0.3 % OT SOLN: 5.0000 [drp] | Freq: Every day | OTIC | 0 refills | Status: DC

## 2022-06-03 NOTE — Assessment & Plan Note (Signed)
Checking labs today.  Will continue supplements as needed.  

## 2022-06-03 NOTE — Progress Notes (Signed)
Established Patient Office Visit  Subjective:  Patient ID: Claire Hansen, female    DOB: 1960/04/08  Age: 62 y.o. MRN: [id number]  Chief Complaint  Patient presents with   Follow-up    2 month follow up    Patient is here today for her 2 months follow up.  She has been feeling fairly well since last appointment.   She does have additional concerns to discuss today.  She had some issues with her ears, and after some pain and difficulty hearing, she had a pop in her left ear and her hearing got better.  Since then, she has had a "ringing" noise in her left ear.   She is very concerned that this may mean she has dementia and is "going to die".   Labs are due today. She needs refills.   I have reviewed her active problem list, medication list, allergies, family history, notes from last encounter, lab results, specialist notes for her appointment today.     No other concerns at this time.   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    Past Surgical History:  Procedure Laterality Date   BACK SURGERY  07/2020   BREAST SURGERY Right    Cyst removed   CESAREAN SECTION     x2   CHOLECYSTECTOMY     CYSTOSCOPY  01/10/2021   Procedure: CYSTOSCOPY;  Surgeon: Schermerhorn, Ihor Austin, MD;  Location: [facility];  Service: Gynecology;;   DILATION AND CURETTAGE OF UTERUS     Treatment of Metorrhagia and Endometrial Ablation   PERONEAL NERVE  DECOMPRESSION Left 04/29/2020   Procedure: PERONEAL NERVE DECOMPRESSION;  Surgeon: Lucy Chris, MD;  Location: [facility];  Service: Neurosurgery;  Laterality: Left;   SUPRACERVICAL ABDOMINAL HYSTERECTOMY Bilateral 01/10/2021   Procedure: HYSTERECTOMY ABDOMINAL WITH  BILATERA SALPINGO OOPHORECTOMY;  Surgeon: Schermerhorn, Ihor Austin, MD;  Location: [facility];  Service: Gynecology;  Laterality: Bilateral;   TONSILLECTOMY     TOTAL HIP ARTHROPLASTY Right 11/05/2016   Procedure: TOTAL HIP ARTHROPLASTY ANTERIOR APPROACH;  Surgeon: Kennedy Bucker, MD;  Location: [facility];  Service: Orthopedics;  Laterality: Right;   TOTAL HIP ARTHROPLASTY Left 07/01/2017   Procedure: TOTAL HIP ARTHROPLASTY ANTERIOR APPROACH;  Surgeon: Kennedy Bucker, MD;  Location: [facility];  Service: Orthopedics;  Laterality: Left;   TOTAL HIP REVISION Left 05/03/2020   Procedure: LEFT FEMORAL COMPONENT REVISION;  Surgeon: Kennedy Bucker, MD;  Location: [facility];  Service: Orthopedics;  Laterality: Left;    Social History   Socioeconomic History   Marital status: Married    Spouse name: Not on file   Number of children: Not on file   Years of education: Not on file   Highest education level: Not on file  Occupational History   Not on file  Tobacco Use   Smoking status: Never   Smokeless tobacco: Never  Vaping Use   Vaping Use: Never used  Substance and Sexual Activity   Alcohol use: No    Alcohol/week: 0.0 standard drinks of alcohol   Drug use: No   Sexual activity: Yes    Partners: Male  Other Topics Concern   Not on file  Social History Narrative   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   Statins Other (See Comments)    Muscle pain/aches, can tolerate rosuvastatin    Review of Systems  HENT:  Positive for ear pain and tinnitus.   All other systems reviewed and are negative.      Objective:   BP (!) 130/90   Pulse 88   Ht 5\' 5"  (1.651 m)   Wt 256 lb (116.1 kg)   LMP 06/04/2016   SpO2 94%   BMI 42.60 kg/m   Vitals:   06/03/22 0904  BP: (!) 130/90  Pulse: 88  Height: 5\' 5"  (1.651 m)  Weight: 256 lb (116.1 kg)  SpO2: 94%  BMI (Calculated): 42.6    Physical Exam Vitals and nursing note reviewed.  Constitutional:      Appearance: Normal appearance. She is obese.  HENT:     Head: Normocephalic.  Eyes:     Pupils: Pupils are equal, round, and reactive to light.  Neck:     Comments: Large goiter.  Cardiovascular:     Rate and Rhythm: Normal rate.  Pulmonary:     Effort: Pulmonary effort is normal.  Neurological:     Mental Status: She is alert.      No results found for any visits on 06/03/22.  No results found for this or any previous visit (from the past 2160 hour(s)).     Assessment & Plan:   Problem List Items Addressed This Visit       Active Problems   Hyperlipidemia    Checking labs today.  Continue current therapy for lipid control. Will modify as needed based on labwork results.       Relevant Orders   Lipid panel   CBC With Differential   CMP14+EGFR   B12 deficiency due to diet - Primary   Relevant Orders   CBC With Differential   CMP14+EGFR   Vitamin B12   Vitamin D deficiency, unspecified    Checking labs today.  Will continue supplements as needed.       Relevant Orders   VITAMIN D 25 Hydroxy (Vit-D Deficiency, Fractures)   CBC With Differential   CMP14+EGFR   Essential hypertension    Blood pressure well  controlled with current medications.  Continue current therapy.  Will reassess at follow up.       Type 2 diabetes mellitus with chronic kidney disease, without long-term current use of insulin, unspecified CKD stage (HCC)   Other Visit Diagnoses     Type  2 diabetes mellitus with hyperglycemia, without long-term current use of insulin (HCC)       Relevant Orders   CBC With Differential   CMP14+EGFR   Hemoglobin A1c   Essential hypertension, benign       Relevant Orders   CBC With Differential   CMP14+EGFR   Other fatigue       Relevant Orders   CBC With Differential   CMP14+EGFR   Goiter       Relevant Orders   CBC With Differential   CMP14+EGFR   TSH       Return in about 3 months (around 09/03/2022).   Total time spent: 30 minutes  Miki Kins, FNP  06/03/2022

## 2022-06-03 NOTE — Assessment & Plan Note (Signed)
Checking labs today.  Continue current therapy for lipid control. Will modify as needed based on labwork results.  

## 2022-06-03 NOTE — Assessment & Plan Note (Signed)
Blood pressure well controlled with current medications.  Continue current therapy.  Will reassess at follow up.  

## 2022-06-05 ENCOUNTER — Encounter: Payer: Self-pay | Admitting: Family

## 2022-06-06 LAB — CBC WITH DIFFERENTIAL
Basophils Absolute: 0 10*3/uL (ref 0.0–0.2)
Basos: 0 %
EOS (ABSOLUTE): 0.3 10*3/uL (ref 0.0–0.4)
Eos: 3 %
Hematocrit: 44.3 % (ref 34.0–46.6)
Hemoglobin: 14.7 g/dL (ref 11.1–15.9)
Immature Grans (Abs): 0 10*3/uL (ref 0.0–0.1)
Immature Granulocytes: 0 %
Lymphocytes Absolute: 2.3 10*3/uL (ref 0.7–3.1)
Lymphs: 32 %
MCH: 27.8 pg (ref 26.6–33.0)
MCHC: 33.2 g/dL (ref 31.5–35.7)
MCV: 84 fL (ref 79–97)
Monocytes Absolute: 0.4 10*3/uL (ref 0.1–0.9)
Monocytes: 5 %
Neutrophils Absolute: 4.4 10*3/uL (ref 1.4–7.0)
Neutrophils: 60 %
RBC: 5.28 x10E6/uL (ref 3.77–5.28)
RDW: 14.7 % (ref 11.7–15.4)
WBC: 7.4 10*3/uL (ref 3.4–10.8)

## 2022-06-06 LAB — LIPID PANEL
Chol/HDL Ratio: 5.7 ratio — ABNORMAL HIGH (ref 0.0–4.4)
Cholesterol, Total: 224 mg/dL — ABNORMAL HIGH (ref 100–199)
HDL: 39 mg/dL — ABNORMAL LOW (ref >39)
LDL Chol Calc (NIH): 139 mg/dL — ABNORMAL HIGH (ref 0–99)
Triglycerides: 256 mg/dL — ABNORMAL HIGH (ref 0–149)
VLDL Cholesterol Cal: 46 mg/dL — ABNORMAL HIGH (ref 5–40)

## 2022-06-06 LAB — CMP14+EGFR
ALT: 33 IU/L — ABNORMAL HIGH (ref 0–32)
AST: 26 IU/L (ref 0–40)
Albumin/Globulin Ratio: 1.7 (ref 1.2–2.2)
Albumin: 4.7 g/dL (ref 3.8–4.9)
Alkaline Phosphatase: 118 IU/L (ref 44–121)
BUN/Creatinine Ratio: 13 (ref 12–28)
BUN: 14 mg/dL (ref 8–27)
Bilirubin Total: 0.2 mg/dL (ref 0.0–1.2)
CO2: 21 mmol/L (ref 20–29)
Calcium: 10.5 mg/dL — ABNORMAL HIGH (ref 8.7–10.3)
Chloride: 102 mmol/L (ref 96–106)
Creatinine, Ser: 1.12 mg/dL — ABNORMAL HIGH (ref 0.57–1.00)
Globulin, Total: 2.8 g/dL (ref 1.5–4.5)
Glucose: 176 mg/dL — ABNORMAL HIGH (ref 70–99)
Potassium: 4.3 mmol/L (ref 3.5–5.2)
Sodium: 140 mmol/L (ref 134–144)
Total Protein: 7.5 g/dL (ref 6.0–8.5)
eGFR: 56 mL/min/{1.73_m2} — ABNORMAL LOW (ref >59)

## 2022-06-06 LAB — HEMOGLOBIN A1C
Est. average glucose Bld gHb Est-mCnc: 200 mg/dL
Hgb A1c MFr Bld: 8.6 % — ABNORMAL HIGH (ref 4.8–5.6)

## 2022-06-06 LAB — TSH: TSH: 0.491 u[IU]/mL (ref 0.450–4.500)

## 2022-06-06 LAB — VITAMIN B12: Vitamin B-12: 647 pg/mL (ref 232–1245)

## 2022-06-06 LAB — VITAMIN D 25 HYDROXY (VIT D DEFICIENCY, FRACTURES): Vit D, 25-Hydroxy: 24.9 ng/mL — ABNORMAL LOW (ref 30.0–100.0)

## 2022-06-15 ENCOUNTER — Encounter: Payer: Self-pay | Admitting: Family

## 2022-06-17 ENCOUNTER — Other Ambulatory Visit: Payer: Managed Care, Other (non HMO)

## 2022-06-26 ENCOUNTER — Other Ambulatory Visit: Payer: Self-pay | Admitting: Otolaryngology

## 2022-06-26 DIAGNOSIS — R1310 Dysphagia, unspecified: Secondary | ICD-10-CM

## 2022-06-26 DIAGNOSIS — F458 Other somatoform disorders: Secondary | ICD-10-CM

## 2022-06-26 DIAGNOSIS — E041 Nontoxic single thyroid nodule: Secondary | ICD-10-CM

## 2022-06-29 ENCOUNTER — Encounter: Payer: Self-pay | Admitting: Family

## 2022-06-29 DIAGNOSIS — E1122 Type 2 diabetes mellitus with diabetic chronic kidney disease: Secondary | ICD-10-CM

## 2022-06-30 MED ORDER — INSULIN PEN NEEDLE 32G X 4 MM MISC
1.0000 | Freq: Four times a day (QID) | 5 refills | Status: DC
Start: 2022-06-30 — End: 2023-04-30

## 2022-07-01 ENCOUNTER — Other Ambulatory Visit: Payer: Self-pay | Admitting: Family

## 2022-07-01 ENCOUNTER — Other Ambulatory Visit: Payer: Self-pay | Admitting: Otolaryngology

## 2022-07-01 DIAGNOSIS — E041 Nontoxic single thyroid nodule: Secondary | ICD-10-CM

## 2022-07-08 ENCOUNTER — Other Ambulatory Visit: Payer: Managed Care, Other (non HMO)

## 2022-07-09 ENCOUNTER — Ambulatory Visit
Admission: RE | Admit: 2022-07-09 | Discharge: 2022-07-09 | Disposition: A | Discharge status: Home / Self Care | Payer: Managed Care, Other (non HMO) | Source: Ambulatory Visit | Attending: Otolaryngology | Admitting: Otolaryngology

## 2022-07-09 DIAGNOSIS — E041 Nontoxic single thyroid nodule: Secondary | ICD-10-CM

## 2022-07-15 ENCOUNTER — Ambulatory Visit
Admission: RE | Admit: 2022-07-15 | Discharge: 2022-07-15 | Disposition: A | Discharge status: Home / Self Care | Payer: Managed Care, Other (non HMO) | Source: Ambulatory Visit | Attending: Family | Admitting: Family

## 2022-07-15 DIAGNOSIS — Z1231 Encounter for screening mammogram for malignant neoplasm of breast: Secondary | ICD-10-CM | POA: Insufficient documentation

## 2022-07-28 ENCOUNTER — Encounter: Payer: Self-pay | Admitting: Family

## 2022-08-04 ENCOUNTER — Encounter: Payer: Self-pay | Admitting: *Deleted

## 2022-08-04 ENCOUNTER — Other Ambulatory Visit: Payer: Self-pay

## 2022-08-04 DIAGNOSIS — R1012 Left upper quadrant pain: Secondary | ICD-10-CM | POA: Insufficient documentation

## 2022-08-04 DIAGNOSIS — I10 Essential (primary) hypertension: Secondary | ICD-10-CM | POA: Insufficient documentation

## 2022-08-04 LAB — CBC
HCT: 43.5 % (ref 36.0–46.0)
Hemoglobin: 14.6 g/dL (ref 12.0–15.0)
MCH: 27.9 pg (ref 26.0–34.0)
MCHC: 33.6 g/dL (ref 30.0–36.0)
MCV: 83.2 fL (ref 80.0–100.0)
Platelets: 297 10*3/uL (ref 150–400)
RBC: 5.23 MIL/uL — ABNORMAL HIGH (ref 3.87–5.11)
RDW: 14.6 % (ref 11.5–15.5)
WBC: 10.5 10*3/uL (ref 4.0–10.5)
nRBC: 0 % (ref 0.0–0.2)

## 2022-08-04 LAB — URINALYSIS, ROUTINE W REFLEX MICROSCOPIC
Bilirubin Urine: NEGATIVE
Glucose, UA: 150 mg/dL — AB
Hgb urine dipstick: NEGATIVE
Ketones, ur: NEGATIVE mg/dL
Leukocytes,Ua: NEGATIVE
Nitrite: NEGATIVE
Protein, ur: 100 mg/dL — AB
Specific Gravity, Urine: 1.016 (ref 1.005–1.030)
pH: 5 (ref 5.0–8.0)

## 2022-08-04 LAB — COMPREHENSIVE METABOLIC PANEL
ALT: 34 U/L (ref 0–44)
AST: 21 U/L (ref 15–41)
Albumin: 4.6 g/dL (ref 3.5–5.0)
Alkaline Phosphatase: 107 U/L (ref 38–126)
Anion gap: 12 (ref 5–15)
BUN: 26 mg/dL — ABNORMAL HIGH (ref 6–20)
CO2: 21 mmol/L — ABNORMAL LOW (ref 22–32)
Calcium: 10 mg/dL (ref 8.9–10.3)
Chloride: 100 mmol/L (ref 98–111)
Creatinine, Ser: 1.04 mg/dL — ABNORMAL HIGH (ref 0.44–1.00)
GFR, Estimated: >60 mL/min (ref >60)
Glucose, Bld: 321 mg/dL — ABNORMAL HIGH (ref 70–99)
Potassium: 3.9 mmol/L (ref 3.5–5.1)
Sodium: 133 mmol/L — ABNORMAL LOW (ref 135–145)
Total Bilirubin: 0.8 mg/dL (ref 0.3–1.2)
Total Protein: 7.9 g/dL (ref 6.5–8.1)

## 2022-08-04 LAB — LIPASE, BLOOD: Lipase: 39 U/L (ref 11–51)

## 2022-08-04 NOTE — ED Triage Notes (Signed)
Pt is here for left sided pain.  Pt reports that she began having pain in her left side around Saturday and has been getting worse.  Pt has had nausea and vomiting with this. No urinary symptoms with this.  LBM was this am was normal.

## 2022-08-05 ENCOUNTER — Emergency Department: Payer: Managed Care, Other (non HMO)

## 2022-08-05 ENCOUNTER — Emergency Department
Admission: EM | Admit: 2022-08-05 | Discharge: 2022-08-05 | Disposition: A | Discharge status: Home / Self Care | Payer: Managed Care, Other (non HMO) | Attending: Emergency Medicine | Admitting: Emergency Medicine

## 2022-08-05 DIAGNOSIS — I10 Essential (primary) hypertension: Secondary | ICD-10-CM

## 2022-08-05 DIAGNOSIS — R1012 Left upper quadrant pain: Secondary | ICD-10-CM

## 2022-08-05 MED ORDER — IOHEXOL 350 MG/ML SOLN
100.0000 mL | Freq: Once | INTRAVENOUS | Status: AC | PRN
  Administered 2022-08-05: 100 mL via INTRAVENOUS

## 2022-08-05 MED ORDER — ONDANSETRON HCL 4 MG/2ML IJ SOLN
4.0000 mg | INTRAMUSCULAR | Status: AC
  Administered 2022-08-05: 4 mg via INTRAVENOUS
  Filled 2022-08-05: qty 2

## 2022-08-05 MED ORDER — ONDANSETRON 4 MG PO TBDP: ORAL_TABLET | ORAL | 0 refills | Status: DC

## 2022-08-05 MED ORDER — IOHEXOL 300 MG/ML  SOLN: 100.0000 mL | Freq: Once | INTRAMUSCULAR | Status: DC | PRN

## 2022-08-05 MED ORDER — TRAMADOL HCL 50 MG PO TABS
100.0000 mg | ORAL_TABLET | Freq: Once | ORAL | Status: AC
  Administered 2022-08-05: 100 mg via ORAL
  Filled 2022-08-05: qty 2

## 2022-08-05 NOTE — Discharge Instructions (Addendum)
As we discussed, we did not identify a specific reason for the discomfort you are experiencing tonight.  We recommend you take over-the-counter ibuprofen and Tylenol and schedule the next available follow-up appointment with your primary care provider to discuss your symptoms and whether longer-term pain management would be appropriate.  Additionally, your blood pressure was very high tonight, but this may be circumstantial while you are in the emergency department.  We will not change her medication regimen at this time given that there is no evidence that you are having an emergent issue as result of the high blood pressure, but you should follow-up with your regular provider about this as well and see if you need a change to your antihypertensive medications.    Return to the emergency department if you develop new or worsening symptoms that concern you.

## 2022-08-05 NOTE — ED Provider Notes (Signed)
[facility] Provider Note    Event Date/Time   First MD Initiated Contact with Patient 08/05/22 0100     (approximate)   History   Abdominal Pain   HPI Claire Hansen is a 62 y.o. female who presents for evaluation of left sided abdominal pain and swelling.  She said that it started about 4 days ago and has steadily been getting worse.  She said it feels like it is in the left side below her ribs and radiating around to the left side of her back.  She has had some associated nausea and vomiting although that has been better today.  She has no fever, chest pain, shortness of breath, or dysuria.  She has had multiple abdominal surgeries in the past including a hysterectomy and a cholecystectomy but never a problem on the side of her abdomen.  She said that although she has hypertension, she does not normally have a blood pressure this high.  But again she is having no chest pain or other symptoms except for the left upper quadrant abdominal pain and swelling.  She has not had any recent trauma.     Physical Exam   Triage Vital Signs: ED Triage Vitals  Enc Vitals Group     BP 08/04/22 2232 (!) 220/112     Pulse Rate 08/04/22 2232 98     Resp 08/04/22 2232 18     Temp 08/04/22 2232 98.4 F (36.9 C)     Temp Source 08/04/22 2232 Oral     SpO2 08/04/22 2232 95 %     Weight 08/04/22 2229 116.1 kg (256 lb)     Height --      Head Circumference --      Peak Flow --      Pain Score 08/04/22 2229 10     Pain Loc --      Pain Edu? --      Excl. in GC? --     Most recent vital signs: Vitals:   08/05/22 0256 08/05/22 0257  BP:  (!) 188/97  Pulse:  81  Resp:  18  Temp: 98 F (36.7 C)   SpO2:  95%    General: Awake, no distress.  Appears generally comfortable until she moves around and then she winces. CV:  Good peripheral perfusion.  Regular rate and rhythm. Resp:  Normal effort. Speaking easily and comfortably, no accessory muscle usage nor  intercostal retractions.   Abd:  Obese.  No palpable deformity or obvious swelling to the left upper quadrant.  She is tender around the area of the spleen but without any palpable organomegaly.  No epigastric tenderness no right upper quadrant tenderness.  No lower abdominal tenderness.  Extensive scarring from prior surgeries.  No rebound or guarding.   ED Results / Procedures / Treatments   Labs (all labs ordered are listed, but only abnormal results are displayed) Labs Reviewed  COMPREHENSIVE METABOLIC PANEL - Abnormal; Notable for the following components:      Result Value   Sodium 133 (*)    CO2 21 (*)    Glucose, Bld 321 (*)    BUN 26 (*)    Creatinine, Ser 1.04 (*)    All other components within normal limits  CBC - Abnormal; Notable for the following components:   RBC 5.23 (*)    All other components within normal limits  URINALYSIS, ROUTINE W REFLEX MICROSCOPIC - Abnormal; Notable for the following components:   Color,  Urine STRAW (*)    APPearance CLEAR (*)    Glucose, UA 150 (*)    Protein, ur 100 (*)    Bacteria, UA RARE (*)    All other components within normal limits  LIPASE, BLOOD     EKG  ED ECG REPORT I, Loleta Rose, the attending physician, personally viewed and interpreted this ECG.  Date: 08/04/2022 EKG Time: 22: 35 Rate: 101 Rhythm: Borderline sinus tachycardia QRS Axis: normal Intervals: normal ST/T Wave abnormalities: normal Narrative Interpretation: no evidence of acute ischemia    RADIOLOGY I viewed and interpreted the patient's CT of the abdomen and pelvis.  I see no evidence of an acute or emergent medical condition including an obstructive pattern or obvious splenomegaly.  Radiology report confirms no acute abnormalities.   PROCEDURES:  Critical Care performed: No  Procedures    IMPRESSION / MDM / ASSESSMENT AND PLAN / ED COURSE  I reviewed the triage vital signs and the nursing notes.                               Differential diagnosis includes, but is not limited to, musculoskeletal strain, ascites, splenomegaly, intra-abdominal abscess.  Patient's presentation is most consistent with acute presentation with potential threat to life or bodily function.  Labs/studies ordered: CMP, lipase, urinalysis, CBC, CT abdomen/pelvis, EKG  Interventions/Medications given:  Medications  iohexol (OMNIPAQUE) 350 MG/ML injection 100 mL (100 mLs Intravenous Contrast Given 08/05/22 0153)  traMADol (ULTRAM) tablet 100 mg (100 mg Oral Given 08/05/22 0300)  ondansetron (ZOFRAN) injection 4 mg (4 mg Intravenous Given 08/05/22 0300)    (Note:  hospital course my include additional interventions and/or labs/studies not listed above.)   Patient is hypertensive but otherwise vitals are stable.  I doubt that the hypertension is clinically relevant particularly given no evidence of acute organ dysfunction or other symptoms.  It would not explain her primary symptom of some left-sided abdominal pain and swelling.  I obtain a CT scan for further evaluation and as documented above, there is no evidence of an acute emergent condition.  I considered treating the blood pressure but she has a primary care physician.  I talked with her about it and she would prefer to defer to her primary care doctor as this may simply be a matter of "whitecoat hypertension".  I think this is very reasonable.  I gave my usual and customary follow-up recommendations and return precautions and encouraged the use of over-the-counter ibuprofen and or Tylenol.  The patient has a long history of tramadol prescriptions and occasional gabapentin prescriptions from her PCP and I will defer to the PCP about longer-term pain management.     Clinical Course as of 08/05/22 0431  Wed Aug 05, 2022  1610 I agreed to give the patient a dose of pain medicine tonight but explained that for longer-term pain care she would need follow-up with her primary care doctor and  she said that she understands. [CF]    Clinical Course User Index [CF] Loleta Rose, MD     FINAL CLINICAL IMPRESSION(S) / ED DIAGNOSES   Final diagnoses:  LUQ pain  Uncontrolled hypertension     Rx / DC Orders   ED Discharge Orders          Ordered    ondansetron (ZOFRAN-ODT) 4 MG disintegrating tablet        08/05/22 0251  Note:  This document was prepared using Dragon voice recognition software and may include unintentional dictation errors.   Loleta Rose, MD 08/05/22 [phone]

## 2022-08-06 ENCOUNTER — Ambulatory Visit
Admission: RE | Admit: 2022-08-06 | Discharge: 2022-08-06 | Disposition: A | Discharge status: Home / Self Care | Payer: Managed Care, Other (non HMO) | Source: Ambulatory Visit | Attending: Otolaryngology | Admitting: Otolaryngology

## 2022-08-06 ENCOUNTER — Encounter: Payer: Self-pay | Admitting: Family

## 2022-08-06 ENCOUNTER — Other Ambulatory Visit: Payer: Self-pay | Admitting: Family

## 2022-08-06 DIAGNOSIS — R1310 Dysphagia, unspecified: Secondary | ICD-10-CM | POA: Diagnosis present

## 2022-08-06 DIAGNOSIS — R1319 Other dysphagia: Secondary | ICD-10-CM

## 2022-08-06 DIAGNOSIS — E041 Nontoxic single thyroid nodule: Secondary | ICD-10-CM | POA: Diagnosis present

## 2022-08-06 DIAGNOSIS — F458 Other somatoform disorders: Secondary | ICD-10-CM | POA: Diagnosis present

## 2022-08-06 MED ORDER — TAMSULOSIN HCL 0.4 MG PO CAPS: 0.4000 mg | ORAL_CAPSULE | Freq: Every day | ORAL | 1 refills | Status: DC

## 2022-08-06 MED ORDER — KETOROLAC TROMETHAMINE 10 MG PO TABS: 10.0000 mg | ORAL_TABLET | Freq: Four times a day (QID) | ORAL | 0 refills | Status: DC | PRN

## 2022-08-06 NOTE — Therapy (Signed)
Modified Barium Swallow Study  Patient Details  Name: Claire Hansen MRN: [id number] Date of Birth: July 07, 1960  Today's Date: 08/06/2022  HPI/PMH: HPI: Pt is 62 yo female who presents for MBSS given complaints of globus sensation with solids and liquids as well as "choking" with dry/particulate solids. Pt currently being worked up for thyroid. Pt seen on 06/26/22 by Dr Andee Poles, ENT, who noted that pt's larynx was rotated on laryngoscopy.   Clinical Impression: Clinical Impression: Pt presents with a functional oropharyngeal swallow. x1 instance transient laryngeal penetration with tsp of thin liquids which occurred before the swallow and cleared with laryngeal vestibule closure. Concern for esophageal dysphagia given the appearance of boluses flowing mainly via R side of pharynx/UES as well as significant esophageal retention with minimal clearance and noted backflow below the PES. During evaluation, globus sensation was reproduced and it correlated with esophageal retention. Recommend continuation of a regular diet (more naturally soft, well-moistened solids) with thin liquids with safe swallowing strategies/reflux precautions as outlined below. Pt would benefit from further esophageal assessment.  Factors that may increase risk of adverse event in presence of aspiration Rubye Oaks & Clearance Coots 2021): No data recorded  Recommendations/Plan: Swallowing Evaluation Recommendations Swallowing Evaluation Recommendations Recommendations: PO diet PO Diet Recommendation: Regular; Thin liquids (Level 0) (naturally soft, well-moistened solids; avoid dry/particulate food) Medication Administration: Other (Comment) (as tolerated; consider crushing larger pills as able) Swallowing strategies  : Slow rate; Small bites/sips; Follow solids with liquids Postural changes: Position pt fully upright for meals; Out of bed for meals (Upright at least 60-90 minutes after meals) Oral care recommendations: Oral care BID  (2x/day) Recommended consults: Consider GI consultation; Consider esophageal assessment    Treatment Plan Treatment Plan Treatment recommendations: No treatment recommended at this time Follow-up recommendations: No SLP follow up     Recommendations Recommendations for follow up therapy are one component of a multi-disciplinary discharge planning process, led by the attending physician.  Recommendations may be updated based on patient status, additional functional criteria and insurance authorization.  Assessment: Orofacial Exam: Orofacial Exam Oral Cavity: Oral Hygiene: WFL Oral Cavity - Dentition: Adequate natural dentition Orofacial Anatomy: WFL Oral Motor/Sensory Function: WFL    Anatomy:  Anatomy: WFL   Boluses Administered: Boluses Administered Boluses Administered: Thin liquids (Level 0); Mildly thick liquids (Level 2, nectar thick); Moderately thick liquids (Level 3, honey thick); Puree; Solid     Oral Impairment Domain: Oral Impairment Domain Lip Closure: No labial escape Tongue control during bolus hold: Cohesive bolus between tongue to palatal seal Bolus preparation/mastication: Timely and efficient chewing and mashing Bolus transport/lingual motion: Brisk tongue motion Oral residue: Complete oral clearance Location of oral residue : N/A Initiation of pharyngeal swallow : Posterior angle of the ramus; Valleculae     Pharyngeal Impairment Domain: Pharyngeal Impairment Domain Soft palate elevation: No bolus between soft palate (SP)/pharyngeal wall (PW) Laryngeal elevation: Complete superior movement of thyroid cartilage with complete approximation of arytenoids to epiglottic petiole Anterior hyoid excursion: Complete anterior movement Epiglottic movement: Complete inversion Laryngeal vestibule closure: Complete, no air/contrast in laryngeal vestibule Pharyngeal stripping wave : Present - complete Pharyngeal contraction (A/P view only): Unilateral  bulging (bolus appeared to traverse via R side of pharynx) Pharyngoesophageal segment opening: Complete distension and complete duration, no obstruction of flow Tongue base retraction: No contrast between tongue base and posterior pharyngeal wall (PPW) Pharyngeal residue: Complete pharyngeal clearance Location of pharyngeal residue: N/A     Esophageal Impairment Domain: Esophageal Impairment Domain Esophageal clearance upright position: Esophageal retention  with retrograde flow below pharyngoesophageal segment (PES); Minimal to no esophageal clearance    Pill: Pill Consistency administered: -- (NA)    Penetration/Aspiration Scale Score: Penetration/Aspiration Scale Score 1.  Material does not enter airway: Thin liquids (Level 0); Mildly thick liquids (Level 2, nectar thick); Moderately thick liquids (Level 3, honey thick); Puree; Solid 2.  Material enters airway, remains ABOVE vocal cords then ejected out: Thin liquids (Level 0)    Compensatory Strategies: No data recorded     General Information: Caregiver present: No   Diet Prior to this Study: -- (consumes "soft" solids and thin liquids)    No data recorded   No data recorded   No data recorded   No data recorded  No data recorded No data recorded No data recorded Volitional Cough: Able to elicit  Volitional Swallow: Able to elicit  No data recorded  Goal Planning: Prognosis for improved oropharyngeal function: -- (no oropharyngeal deficits; n/a)  Barriers to Reach Goals: Overall medical prognosis  No data recorded Patient/Family Stated Goal: "to figure out what's going on with my swallow"  Consulted and agree with results and recommendations: Patient   Pain: Pain Assessment Pain Assessment: No/denies pain    End of Session: Start Time:No data recorded Stop Time: No data recorded Time Calculation:No data recorded Charges: SLP Evaluations $ SLP Speech Visit: 1 Visit  SLP  Evaluations $Outpatient MBS Swallow: 1 Procedure   SLP visit diagnosis: SLP Visit Diagnosis: Dysphagia, pharyngoesophageal phase (R13.14)    Past Medical History:  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   Past Surgical History:  Past Surgical History:  Procedure Laterality Date   BACK SURGERY  07/2020   BREAST SURGERY Right    Cyst removed   CESAREAN SECTION     x2   CHOLECYSTECTOMY     CYSTOSCOPY  01/10/2021   Procedure: CYSTOSCOPY;  Surgeon: Schermerhorn, Ihor Austin, MD;  Location: [facility];  Service: Gynecology;;   DILATION AND CURETTAGE OF UTERUS     Treatment of Metorrhagia and Endometrial Ablation   PERONEAL NERVE DECOMPRESSION Left 04/29/2020   Procedure: PERONEAL NERVE DECOMPRESSION;  Surgeon: Lucy Chris, MD;  Location: [facility];  Service: Neurosurgery;  Laterality: Left;   SUPRACERVICAL ABDOMINAL HYSTERECTOMY Bilateral 01/10/2021   Procedure: HYSTERECTOMY ABDOMINAL WITH  BILATERA SALPINGO OOPHORECTOMY;  Surgeon: Schermerhorn, Ihor Austin, MD;  Location: [facility];  Service: Gynecology;  Laterality: Bilateral;   TONSILLECTOMY     TOTAL HIP ARTHROPLASTY Right 11/05/2016   Procedure: TOTAL HIP ARTHROPLASTY ANTERIOR APPROACH;  Surgeon: Kennedy Bucker, MD;  Location: [facility];  Service: Orthopedics;  Laterality: Right;   TOTAL HIP ARTHROPLASTY Left 07/01/2017   Procedure: TOTAL HIP ARTHROPLASTY ANTERIOR APPROACH;  Surgeon: Kennedy Bucker, MD;   Location: [facility];  Service: Orthopedics;  Laterality: Left;   TOTAL HIP REVISION Left 05/03/2020   Procedure: LEFT FEMORAL COMPONENT REVISION;  Surgeon: Kennedy Bucker, MD;  Location: [facility];  Service: Orthopedics;  Laterality: Left;   Clyde Canterbury, M.S., CCC-SLP Speech-Language Pathologist [facility] [phone] (ASCOM)  Woodroe Chen 08/06/2022, 2:16 PM

## 2022-08-12 MED ORDER — VALACYCLOVIR HCL 1 G PO TABS: 1000.0000 mg | ORAL_TABLET | Freq: Three times a day (TID) | ORAL | 0 refills | Status: AC

## 2022-08-13 ENCOUNTER — Other Ambulatory Visit: Payer: Self-pay | Admitting: Family

## 2022-08-20 ENCOUNTER — Ambulatory Visit: Payer: Managed Care, Other (non HMO) | Admitting: Neurosurgery

## 2022-08-27 ENCOUNTER — Other Ambulatory Visit: Payer: Self-pay | Admitting: Otolaryngology

## 2022-08-27 DIAGNOSIS — E041 Nontoxic single thyroid nodule: Secondary | ICD-10-CM

## 2022-09-01 ENCOUNTER — Ambulatory Visit
Admission: RE | Admit: 2022-09-01 | Discharge: 2022-09-01 | Disposition: A | Discharge status: Home / Self Care | Payer: Managed Care, Other (non HMO) | Source: Ambulatory Visit | Attending: Otolaryngology | Admitting: Otolaryngology

## 2022-09-01 DIAGNOSIS — E042 Nontoxic multinodular goiter: Secondary | ICD-10-CM | POA: Diagnosis not present

## 2022-09-01 DIAGNOSIS — E041 Nontoxic single thyroid nodule: Secondary | ICD-10-CM

## 2022-09-01 MED ORDER — LIDOCAINE HCL (PF) 1 % IJ SOLN
20.0000 mL | Freq: Once | INTRAMUSCULAR | Status: AC
  Administered 2022-09-01: 20 mL via INTRADERMAL
  Filled 2022-09-01: qty 20

## 2022-09-02 ENCOUNTER — Ambulatory Visit: Payer: Managed Care, Other (non HMO) | Admitting: Family

## 2022-09-02 VITALS — BP 120/70 | HR 94 | Ht 65.0 in | Wt 276.0 lb

## 2022-09-02 DIAGNOSIS — R946 Abnormal results of thyroid function studies: Secondary | ICD-10-CM

## 2022-09-02 DIAGNOSIS — E049 Nontoxic goiter, unspecified: Secondary | ICD-10-CM

## 2022-09-02 DIAGNOSIS — E559 Vitamin D deficiency, unspecified: Secondary | ICD-10-CM

## 2022-09-02 DIAGNOSIS — E782 Mixed hyperlipidemia: Secondary | ICD-10-CM | POA: Diagnosis not present

## 2022-09-02 DIAGNOSIS — E538 Deficiency of other specified B group vitamins: Secondary | ICD-10-CM | POA: Diagnosis not present

## 2022-09-02 DIAGNOSIS — I10 Essential (primary) hypertension: Secondary | ICD-10-CM | POA: Diagnosis not present

## 2022-09-02 DIAGNOSIS — E1122 Type 2 diabetes mellitus with diabetic chronic kidney disease: Secondary | ICD-10-CM

## 2022-09-02 DIAGNOSIS — Z6841 Body Mass Index (BMI) 40.0 and over, adult: Secondary | ICD-10-CM

## 2022-09-02 MED ORDER — GABAPENTIN 100 MG PO CAPS: 100.0000 mg | ORAL_CAPSULE | Freq: Three times a day (TID) | ORAL | 0 refills | Status: DC

## 2022-09-02 MED ORDER — KETOROLAC TROMETHAMINE 10 MG PO TABS: 10.0000 mg | ORAL_TABLET | Freq: Four times a day (QID) | ORAL | 0 refills | Status: DC | PRN

## 2022-09-02 MED ORDER — VALACYCLOVIR HCL 1 G PO TABS: 1000.0000 mg | ORAL_TABLET | Freq: Three times a day (TID) | ORAL | 0 refills | Status: DC

## 2022-09-02 NOTE — Progress Notes (Signed)
Established Patient Office Visit  Subjective:  Patient ID: Claire Hansen, female    DOB: Jul 02, 1960  Age: 62 y.o. MRN: [id number]  Chief Complaint  Patient presents with   Follow-up    3 mo f/u    Patient is here today for her 3 months follow up.  She has been feeling fairly well since last appointment.   She does have additional concerns to discuss today.  She had her thyroid biopsy yesterday but has not yet gotten results. She asks about the surgery and my opinion of what the best course of action is.   She also is still having issues with shingles as well.  She did finish the valacyclovir previously, and says that she has still been having the rash and significant pain.   Labs are due today. She needs refills.   I have reviewed her active problem list, medication list, allergies, notes from last encounter, lab results for her appointment today.    No other concerns at this time.   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    Past Surgical History:  Procedure Laterality Date   BACK SURGERY  07/2020   BREAST SURGERY Right    Cyst removed   CESAREAN SECTION     x2   CHOLECYSTECTOMY     CYSTOSCOPY  01/10/2021   Procedure: CYSTOSCOPY;  Surgeon: Schermerhorn, Ihor Austin, MD;  Location: [facility];  Service: Gynecology;;   DILATION AND CURETTAGE OF UTERUS     Treatment of Metorrhagia and Endometrial Ablation   PERONEAL NERVE DECOMPRESSION  Left 04/29/2020   Procedure: PERONEAL NERVE DECOMPRESSION;  Surgeon: Lucy Chris, MD;  Location: [facility];  Service: Neurosurgery;  Laterality: Left;   SUPRACERVICAL ABDOMINAL HYSTERECTOMY Bilateral 01/10/2021   Procedure: HYSTERECTOMY ABDOMINAL WITH  BILATERA SALPINGO OOPHORECTOMY;  Surgeon: Schermerhorn, Ihor Austin, MD;  Location: [facility];  Service: Gynecology;  Laterality: Bilateral;   TONSILLECTOMY     TOTAL HIP ARTHROPLASTY Right 11/05/2016   Procedure: TOTAL HIP ARTHROPLASTY ANTERIOR APPROACH;  Surgeon: Kennedy Bucker, MD;  Location: [facility];  Service: Orthopedics;  Laterality: Right;   TOTAL HIP ARTHROPLASTY Left 07/01/2017   Procedure: TOTAL HIP ARTHROPLASTY ANTERIOR APPROACH;  Surgeon: Kennedy Bucker, MD;  Location: [facility];  Service: Orthopedics;  Laterality: Left;   TOTAL HIP REVISION Left 05/03/2020   Procedure: LEFT FEMORAL COMPONENT REVISION;  Surgeon: Kennedy Bucker, MD;  Location: [facility];  Service: Orthopedics;  Laterality: Left;    Social History   Socioeconomic History   Marital status: Married    Spouse name: Not on file   Number of children: Not on file   Years of education: Not on file   Highest education level: Not on file  Occupational History   Not on file  Tobacco Use   Smoking status: Never   Smokeless tobacco: Never  Vaping Use  Vaping status: Never Used  Substance and Sexual Activity   Alcohol use: No    Alcohol/week: 0.0 standard drinks of alcohol   Drug use: No   Sexual activity: Yes    Partners: Male  Other Topics Concern   Not on file  Social History Narrative   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    1.5 yrs college    Caffeine-    Exercises 3 x weekly    Social Determinants of Health   Financial Resource Strain: Not on file  Food Insecurity: Not on file  Transportation Needs: No Transportation Needs (08/13/2020)   Received from [facility], [facility]   PRAPARE - Transportation    Lack of  Transportation (Medical): No    Lack of Transportation (Non-Medical): No  Physical Activity: Not on file  Stress: Not on file  Social Connections: Not on file  Intimate Partner Violence: Not on file    Family History  Problem Relation Age of Onset   Asthma Son     Allergies  Allergen Reactions   Shellfish Allergy Anaphylaxis and Nausea And Vomiting    Topical betadine is OK to use.   Statins Other (See Comments)    Muscle pain/aches, can tolerate rosuvastatin    Review of Systems  Neurological:  Positive for tingling and weakness.  All other systems reviewed and are negative.      Objective:   BP 120/70   Pulse 94   Ht 5\' 5"  (1.651 m)   Wt 276 lb (125.2 kg)   LMP 06/04/2016   SpO2 96%   BMI 45.93 kg/m   Vitals:   09/02/22 0910  BP: 120/70  Pulse: 94  Height: 5\' 5"  (1.651 m)  Weight: 276 lb (125.2 kg)  SpO2: 96%  BMI (Calculated): 45.93    Physical Exam Vitals and nursing note reviewed.  Constitutional:      Appearance: Normal appearance. She is normal weight.  HENT:     Head: Normocephalic.  Eyes:     Pupils: Pupils are equal, round, and reactive to light.  Cardiovascular:     Rate and Rhythm: Normal rate.  Pulmonary:     Effort: Pulmonary effort is normal.  Skin:    Findings: Rash (on middle left back.) present.  Neurological:     General: No focal deficit present.     Mental Status: She is alert and oriented to person, place, and time. Mental status is at baseline.     Motor: Weakness present.     Gait: Gait abnormal.  Psychiatric:        Mood and Affect: Mood normal.        Behavior: Behavior normal.        Thought Content: Thought content normal.        Judgment: Judgment normal.      Results for orders placed or performed in visit on 09/02/22  Lipid panel  Result Value Ref Range   Cholesterol, Total 259 (H) 100 - 199 mg/dL   Triglycerides 161 (H) 0 - 149 mg/dL   HDL 44 >09 mg/dL   VLDL Cholesterol Cal 44 (H) 5 - 40 mg/dL   LDL  Chol Calc (NIH) 171 (H) 0 - 99 mg/dL   Chol/HDL Ratio 5.9 (H) 0.0 - 4.4 ratio  VITAMIN D 25 Hydroxy (Vit-D Deficiency, Fractures)  Result Value Ref Range   Vit D, 25-Hydroxy 22.0 (L) 30.0 - 100.0 ng/mL  CMP14+EGFR  Result Value  Ref Range   Glucose 158 (H) 70 - 99 mg/dL   BUN 10 8 - 27 mg/dL   Creatinine, Ser 6.96 (H) 0.57 - 1.00 mg/dL   eGFR 60 >29 BM/WUX/3.24   BUN/Creatinine Ratio 9 (L) 12 - 28   Sodium 143 134 - 144 mmol/L   Potassium 4.3 3.5 - 5.2 mmol/L   Chloride 101 96 - 106 mmol/L   CO2 23 20 - 29 mmol/L   Calcium 10.7 (H) 8.7 - 10.3 mg/dL   Total Protein 7.6 6.0 - 8.5 g/dL   Albumin 4.8 3.8 - 4.9 g/dL   Globulin, Total 2.8 1.5 - 4.5 g/dL   Bilirubin Total 0.3 0.0 - 1.2 mg/dL   Alkaline Phosphatase 111 44 - 121 IU/L   AST 27 0 - 40 IU/L   ALT 47 (H) 0 - 32 IU/L  TSH  Result Value Ref Range   TSH 0.434 (L) 0.450 - 4.500 uIU/mL  Hemoglobin A1c  Result Value Ref Range   Hgb A1c MFr Bld 8.6 (H) 4.8 - 5.6 %   Est. average glucose Bld gHb Est-mCnc 200 mg/dL  Vitamin M01  Result Value Ref Range   Vitamin B-12 544 232 - 1,245 pg/mL    Recent Results (from the past 2160 hour(s))  Lipase, blood     Status: None   Collection Time: 08/04/22 10:38 PM  Result Value Ref Range   Lipase 39 11 - 51 U/L    Comment: Performed at [facility], [address] [place], [place] [address]  Comprehensive metabolic panel     Status: Abnormal   Collection Time: 08/04/22 10:38 PM  Result Value Ref Range   Sodium 133 (L) 135 - 145 mmol/L   Potassium 3.9 3.5 - 5.1 mmol/L   Chloride 100 98 - 111 mmol/L   CO2 21 (L) 22 - 32 mmol/L   Glucose, Bld 321 (H) 70 - 99 mg/dL    Comment: Glucose reference range applies only to samples taken after fasting for at least 8 hours.   BUN 26 (H) 6 - 20 mg/dL   Creatinine, Ser 3.66 (H) 0.44 - 1.00 mg/dL   Calcium 44.0 8.9 - 34.7 mg/dL   Total Protein 7.9 6.5 - 8.1 g/dL   Albumin 4.6 3.5 - 5.0 g/dL   AST 21 15 - 41 U/L   ALT 34 0 - 44 U/L    Alkaline Phosphatase 107 38 - 126 U/L   Total Bilirubin 0.8 0.3 - 1.2 mg/dL   GFR, Estimated >42 >59 mL/min    Comment: (NOTE) Calculated using the CKD-EPI Creatinine Equation (2021)    Anion gap 12 5 - 15    Comment: Performed at [facility], [address] [place], [place] [address]  CBC     Status: Abnormal   Collection Time: 08/04/22 10:38 PM  Result Value Ref Range   WBC 10.5 4.0 - 10.5 K/uL   RBC 5.23 (H) 3.87 - 5.11 MIL/uL   Hemoglobin 14.6 12.0 - 15.0 g/dL   HCT 56.4 33.2 - 95.1 %   MCV 83.2 80.0 - 100.0 fL   MCH 27.9 26.0 - 34.0 pg   MCHC 33.6 30.0 - 36.0 g/dL   RDW 88.4 16.6 - 06.3 %   Platelets 297 150 - 400 K/uL   nRBC 0.0 0.0 - 0.2 %    Comment: Performed at [facility], [address] [place], [place] [address]  Urinalysis, Routine w reflex microscopic -Urine, Clean Catch  Status: Abnormal   Collection Time: 08/04/22 10:38 PM  Result Value Ref Range   Color, Urine STRAW (A) YELLOW   APPearance CLEAR (A) CLEAR   Specific Gravity, Urine 1.016 1.005 - 1.030   pH 5.0 5.0 - 8.0   Glucose, UA 150 (A) NEGATIVE mg/dL   Hgb urine dipstick NEGATIVE NEGATIVE   Bilirubin Urine NEGATIVE NEGATIVE   Ketones, ur NEGATIVE NEGATIVE mg/dL   Protein, ur 102 (A) NEGATIVE mg/dL   Nitrite NEGATIVE NEGATIVE   Leukocytes,Ua NEGATIVE NEGATIVE   RBC / HPF 0-5 0 - 5 RBC/hpf   WBC, UA 0-5 0 - 5 WBC/hpf   Bacteria, UA RARE (A) NONE SEEN   Squamous Epithelial / HPF 0-5 0 - 5 /HPF    Comment: Performed at [facility], [address] [place], [place] [address]  Lipid panel     Status: Abnormal   Collection Time: 09/02/22 10:33 AM  Result Value Ref Range   Cholesterol, Total 259 (H) 100 - 199 mg/dL   Triglycerides 644 (H) 0 - 149 mg/dL   HDL 44 >03 mg/dL   VLDL Cholesterol Cal 44 (H) 5 - 40 mg/dL   LDL Chol Calc (NIH) 474 (H) 0 - 99 mg/dL   Chol/HDL Ratio 5.9 (H) 0.0 - 4.4 ratio    Comment:                                   T. Chol/HDL Ratio                                              Men  Women                               1/2 Avg.Risk  3.4    3.3                                   Avg.Risk  5.0    4.4                                2X Avg.Risk  9.6    7.1                                3X Avg.Risk 23.4   11.0   VITAMIN D 25 Hydroxy (Vit-D Deficiency, Fractures)     Status: Abnormal   Collection Time: 09/02/22 10:33 AM  Result Value Ref Range   Vit D, 25-Hydroxy 22.0 (L) 30.0 - 100.0 ng/mL    Comment: Vitamin D deficiency has been defined by the Institute of Medicine and an Endocrine Society practice guideline as a level of serum 25-OH vitamin D less than 20 ng/mL (1,2). The Endocrine Society went on to further define vitamin D insufficiency as a level between 21 and 29 ng/mL (2). 1. IOM (Institute of Medicine). 2010. Dietary reference    intakes for calcium and D. [place]: The    [employer]. 2. Holick MF, Binkley Pennington, Bischoff-Ferrari HA, et al.    Evaluation, treatment, and prevention of  vitamin D    deficiency: an Endocrine Society clinical practice    guideline. JCEM. 2011 Jul; 96(7):1911-30.   CMP14+EGFR     Status: Abnormal   Collection Time: 09/02/22 10:33 AM  Result Value Ref Range   Glucose 158 (H) 70 - 99 mg/dL   BUN 10 8 - 27 mg/dL   Creatinine, Ser 8.29 (H) 0.57 - 1.00 mg/dL   eGFR 60 >56 OZ/HYQ/6.57   BUN/Creatinine Ratio 9 (L) 12 - 28   Sodium 143 134 - 144 mmol/L   Potassium 4.3 3.5 - 5.2 mmol/L   Chloride 101 96 - 106 mmol/L   CO2 23 20 - 29 mmol/L   Calcium 10.7 (H) 8.7 - 10.3 mg/dL   Total Protein 7.6 6.0 - 8.5 g/dL   Albumin 4.8 3.8 - 4.9 g/dL   Globulin, Total 2.8 1.5 - 4.5 g/dL   Bilirubin Total 0.3 0.0 - 1.2 mg/dL   Alkaline Phosphatase 111 44 - 121 IU/L   AST 27 0 - 40 IU/L   ALT 47 (H) 0 - 32 IU/L  TSH     Status: Abnormal   Collection Time: 09/02/22 10:33 AM  Result Value Ref Range   TSH 0.434 (L) 0.450 - 4.500 uIU/mL  Hemoglobin A1c     Status: Abnormal   Collection  Time: 09/02/22 10:33 AM  Result Value Ref Range   Hgb A1c MFr Bld 8.6 (H) 4.8 - 5.6 %    Comment:          Prediabetes: 5.7 - 6.4          Diabetes: >6.4          Glycemic control for adults with diabetes: <7.0    Est. average glucose Bld gHb Est-mCnc 200 mg/dL  Vitamin Q46     Status: None   Collection Time: 09/02/22 10:33 AM  Result Value Ref Range   Vitamin B-12 544 232 - 1,245 pg/mL       Assessment & Plan:   Problem List Items Addressed This Visit       Active Problems   Hyperlipidemia - Primary    Checking labs today.  Continue current therapy for lipid control. Will modify as needed based on labwork results.       Relevant Orders   Lipid panel (Completed)   CMP14+EGFR (Completed)   CBC with Differential/Platelet   BMI 45.0-49.9, adult (HCC)    Continue current meds.  Will adjust as needed based on results.  The patient is asked to make an attempt to improve diet and exercise patterns to aid in medical management of this problem. Addressed importance of increasing and maintaining water intake.       Abnormal results of thyroid function studies   Relevant Orders   CMP14+EGFR (Completed)   TSH (Completed)   CBC with Differential/Platelet   B12 deficiency due to diet    Checking labs today.  Will continue supplements as needed.      Relevant Orders   CMP14+EGFR (Completed)   Vitamin B12 (Completed)   CBC with Differential/Platelet   Vitamin D deficiency, unspecified    Checking labs today.  Will continue supplements as needed.        Relevant Orders   VITAMIN D 25 Hydroxy (Vit-D Deficiency, Fractures) (Completed)   CMP14+EGFR (Completed)   CBC with Differential/Platelet   Essential hypertension    Blood pressure well controlled with current medications.  Continue current therapy.  Will reassess at follow up.  Relevant Orders   CMP14+EGFR (Completed)   CBC with Differential/Platelet   Type 2 diabetes mellitus with chronic kidney disease,  without long-term current use of insulin, unspecified CKD stage (HCC)    Checking labs today. Will call pt. With results  Continue current diabetes POC, as patient has been well controlled on current regimen.  Will adjust meds if needed based on labs.       Relevant Orders   CMP14+EGFR (Completed)   Hemoglobin A1c (Completed)   CBC with Differential/Platelet   Other Visit Diagnoses     Goiter       Patient is seeing Endocrine.  Defer to them for next steps regarding her biopsy results.       Return in about 3 months (around 12/03/2022).   Total time spent: 30 minutes  Miki Kins, FNP  09/02/2022   This document may have been prepared by [facility] Voice Recognition software and as such may include unintentional dictation errors.

## 2022-09-05 ENCOUNTER — Encounter: Payer: Self-pay | Admitting: Family

## 2022-09-05 NOTE — Assessment & Plan Note (Signed)
Blood pressure well controlled with current medications.  Continue current therapy.  Will reassess at follow up.  

## 2022-09-05 NOTE — Assessment & Plan Note (Signed)
Checking labs today.  Will continue supplements as needed.  

## 2022-09-05 NOTE — Assessment & Plan Note (Signed)
Continue current meds.  Will adjust as needed based on results.  The patient is asked to make an attempt to improve diet and exercise patterns to aid in medical management of this problem. Addressed importance of increasing and maintaining water intake.   

## 2022-09-05 NOTE — Assessment & Plan Note (Signed)
Checking labs today.  Continue current therapy for lipid control. Will modify as needed based on labwork results.  

## 2022-09-05 NOTE — Assessment & Plan Note (Signed)
Checking labs today. Will call pt. With results  Continue current diabetes POC, as patient has been well controlled on current regimen.  Will adjust meds if needed based on labs.  

## 2022-09-06 ENCOUNTER — Other Ambulatory Visit: Payer: Self-pay | Admitting: Family

## 2022-09-10 ENCOUNTER — Other Ambulatory Visit: Payer: Self-pay | Admitting: Family

## 2022-09-14 ENCOUNTER — Other Ambulatory Visit: Payer: Self-pay

## 2022-09-14 ENCOUNTER — Encounter: Payer: Self-pay | Admitting: Family

## 2022-09-17 ENCOUNTER — Other Ambulatory Visit: Payer: Self-pay | Admitting: Family

## 2022-09-18 ENCOUNTER — Encounter: Payer: Self-pay | Admitting: Family

## 2022-09-22 ENCOUNTER — Telehealth: Payer: Self-pay

## 2022-09-22 NOTE — Telephone Encounter (Signed)
The coordinator from [facility] called in to check on the referral.

## 2022-09-23 ENCOUNTER — Other Ambulatory Visit: Payer: Self-pay | Admitting: Family

## 2022-09-29 ENCOUNTER — Encounter: Payer: Self-pay | Admitting: Family

## 2022-09-29 ENCOUNTER — Other Ambulatory Visit: Payer: Self-pay

## 2022-09-29 MED ORDER — INSULIN LISPRO 100 UNIT/ML IJ SOLN: 2.0000 [IU] | Freq: Three times a day (TID) | INTRAMUSCULAR | 2 refills | Status: DC

## 2022-10-05 ENCOUNTER — Other Ambulatory Visit: Payer: Self-pay

## 2022-10-05 MED ORDER — INSULIN LISPRO 100 UNIT/ML IJ SOLN: 2.0000 [IU] | Freq: Three times a day (TID) | INTRAMUSCULAR | 2 refills | Status: DC

## 2022-10-06 MED ORDER — INSULIN LISPRO 100 UNIT/ML IJ SOLN: 2.0000 [IU] | Freq: Three times a day (TID) | INTRAMUSCULAR | 2 refills | Status: AC

## 2022-10-18 IMAGING — CT CT HEAD W/O CM
3 series · 16 of 47 positions shown, 19 images · non-contrast
Comparison: February 17, 2019.

CLINICAL DATA: Transient ischemic attack.

EXAM:
CT HEAD WITHOUT CONTRAST
TECHNIQUE: Contiguous axial images were obtained from the base of the skull
through the vertex without intravenous contrast.

[Series 2: head wo · axial · 0.42mm/px · z∈[-99,+36]mm · 10 of 33 slices shown, 13 images]
[im 3/33  brain]
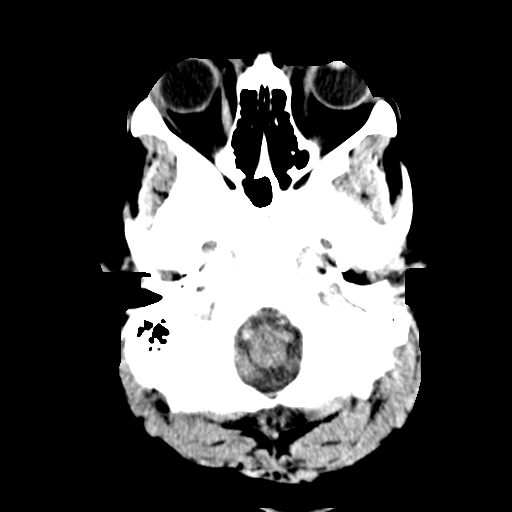
[im 3/33  bone]
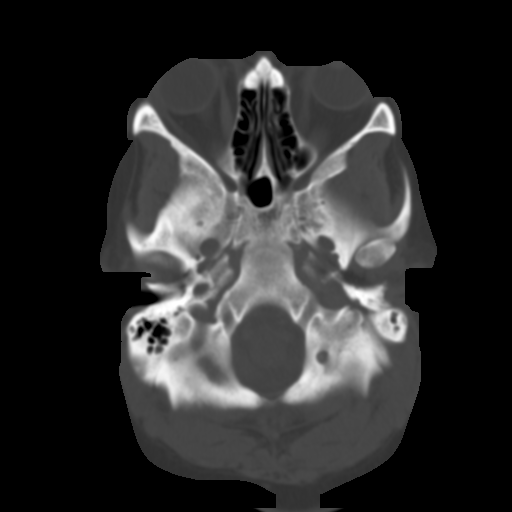
[im 6/33  brain]
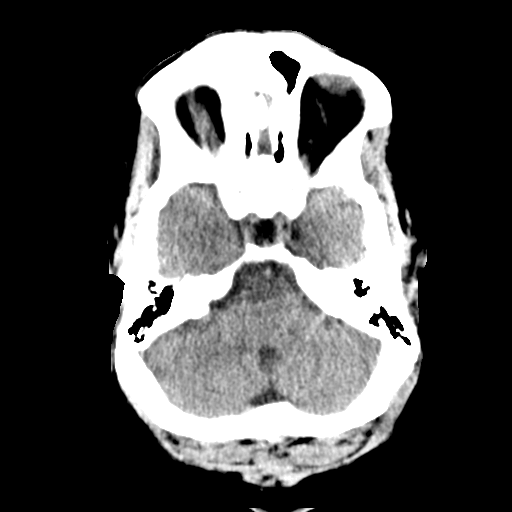
[im 9/33  brain]
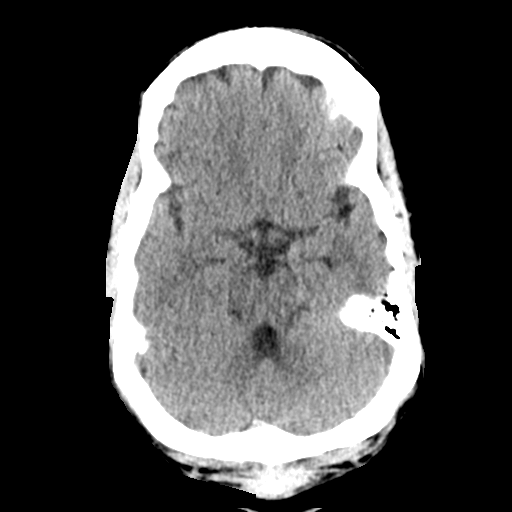
[im 12/33  brain]
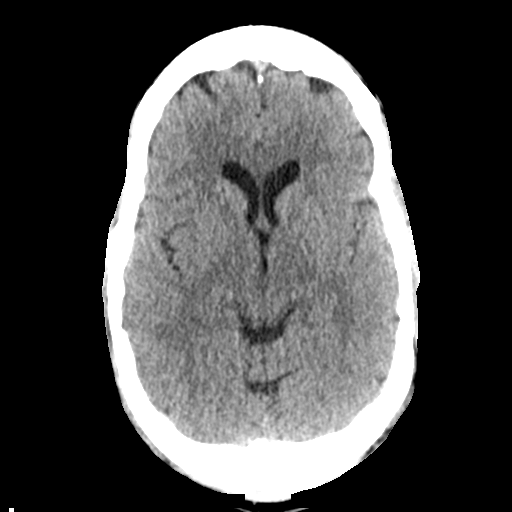
[im 15/33  brain]
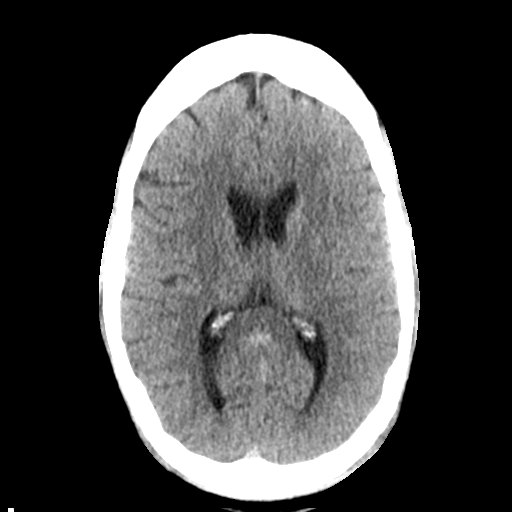
[im 15/33  bone]
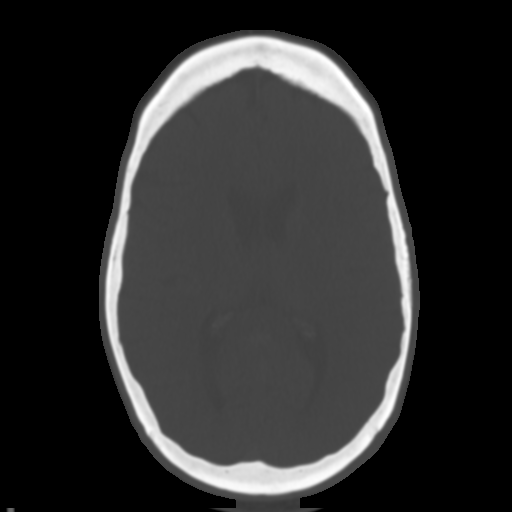
[im 18/33  brain]
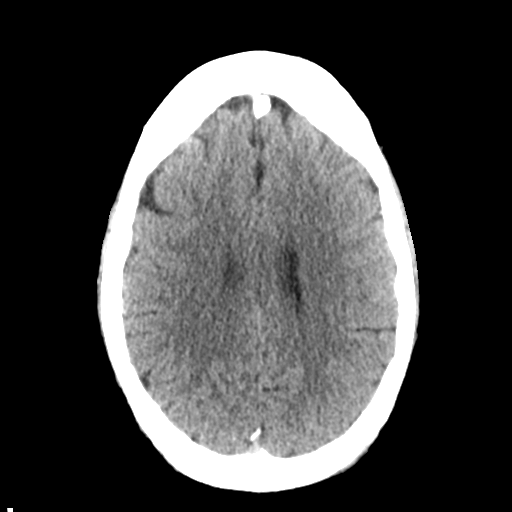
[im 21/33  brain]
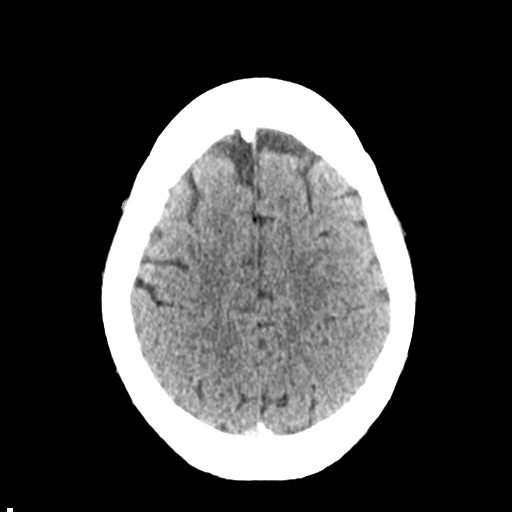
[im 25/33  brain]
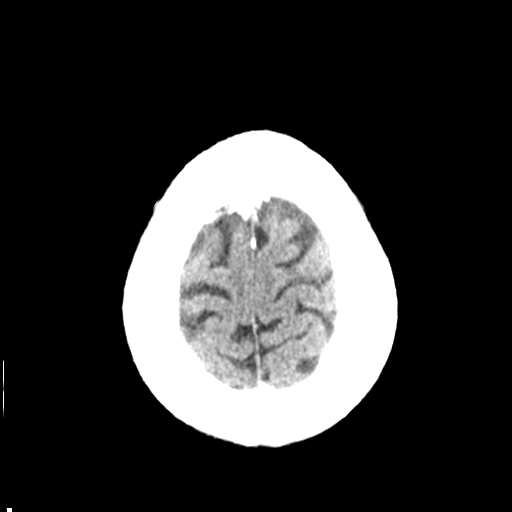
[im 27/33  brain]
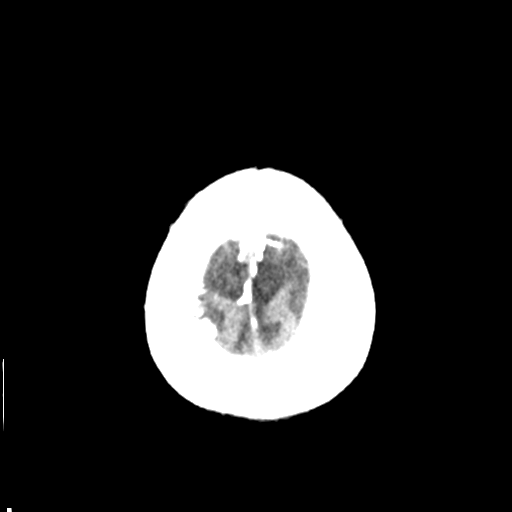
[im 27/33  bone]
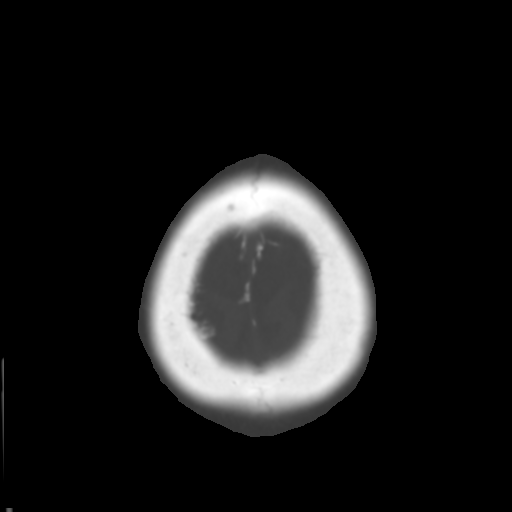
[im 30/33  brain]
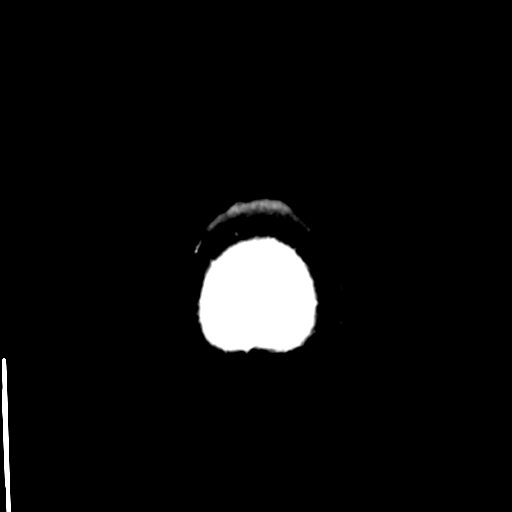

[Series 4: coronal soft tissue · coronal · 0.35mm/px · 3 of 71 slices shown]
[im 24/71  brain]
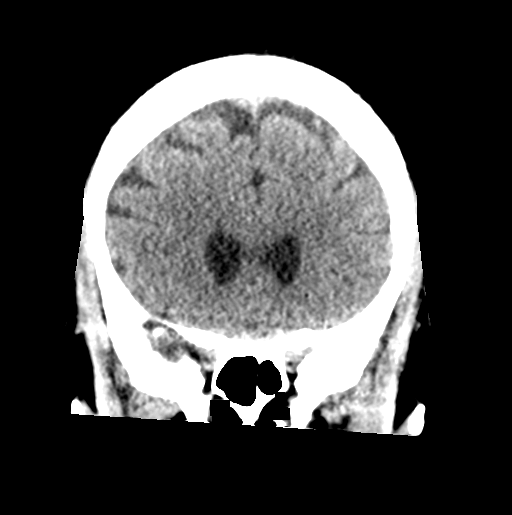
[im 32/71  brain]
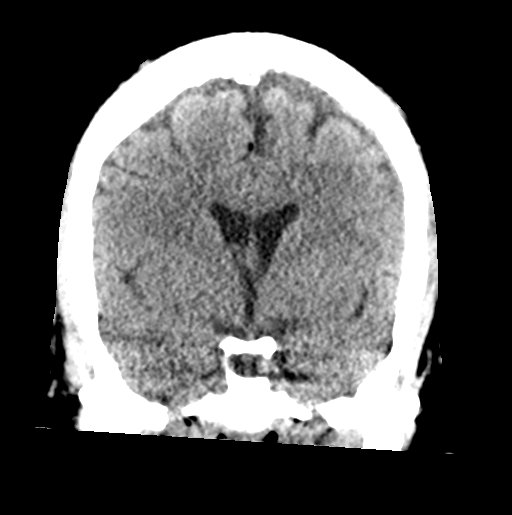
[im 39/71  brain]
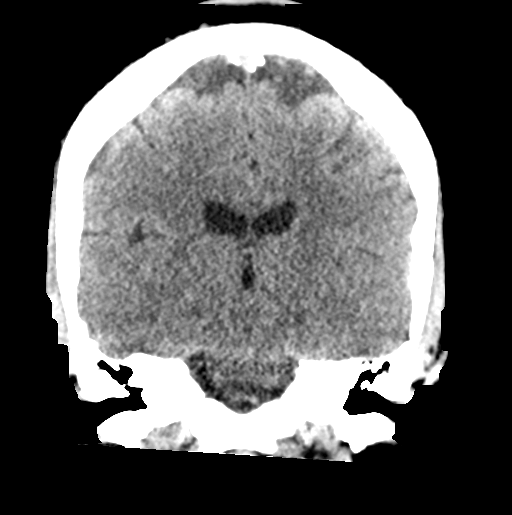

[Series 5: sagittal soft tissue · sagittal · 0.35mm/px · 3 of 55 slices shown]
[im 19/55  brain]
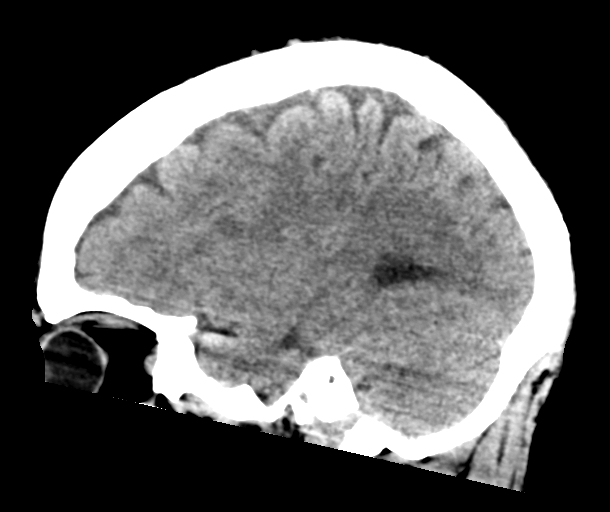
[im 28/55  brain]
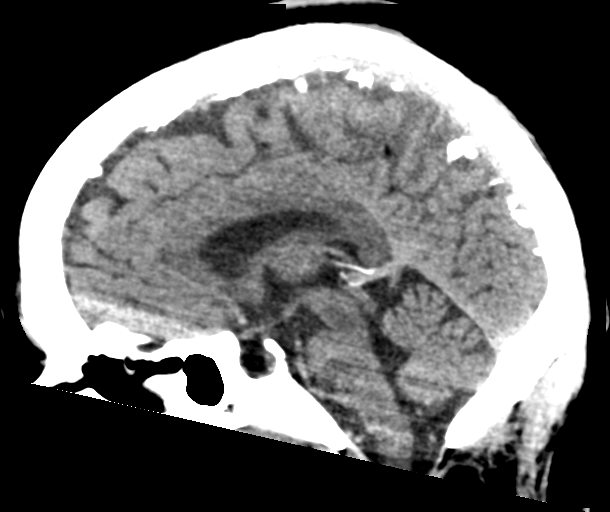
[im 37/55  brain]
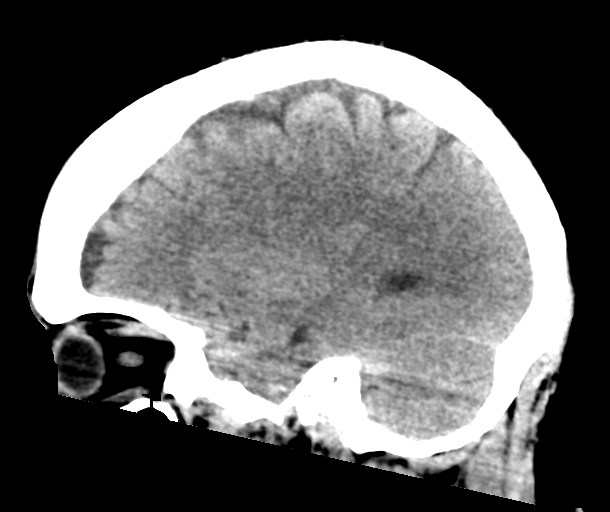

[16 of 47 positions shown; findings below may reference images not displayed]

FINDINGS: Brain: No evidence of acute infarction, hemorrhage, hydrocephalus,
extra-axial collection or mass lesion/mass effect.

Vascular: No hyperdense vessel or unexpected calcification.

Skull: Normal. Negative for fracture or focal lesion.

Sinuses/Orbits: No acute finding.

Other: None.
IMPRESSION: Normal head CT.

## 2022-12-30 ENCOUNTER — Ambulatory Visit: Payer: Managed Care, Other (non HMO) | Admitting: Family

## 2023-01-05 ENCOUNTER — Encounter: Payer: Self-pay | Admitting: Family

## 2023-01-08 ENCOUNTER — Other Ambulatory Visit: Payer: Self-pay | Admitting: Family

## 2023-01-08 MED ORDER — FLUCONAZOLE 150 MG PO TABS: 150.0000 mg | ORAL_TABLET | Freq: Every day | ORAL | 0 refills | Status: DC

## 2023-01-08 MED ORDER — OFLOXACIN 0.3 % OT SOLN: 5.0000 [drp] | Freq: Every day | OTIC | 0 refills | Status: DC

## 2023-01-28 ENCOUNTER — Other Ambulatory Visit: Payer: Self-pay

## 2023-01-28 MED ORDER — FLUCONAZOLE 150 MG PO TABS: 150.0000 mg | ORAL_TABLET | Freq: Every day | ORAL | 0 refills | Status: DC

## 2023-01-29 ENCOUNTER — Ambulatory Visit: Payer: Managed Care, Other (non HMO) | Admitting: Family

## 2023-01-29 VITALS — BP 140/90 | HR 132 | Ht 65.0 in | Wt 274.0 lb

## 2023-01-29 DIAGNOSIS — I10 Essential (primary) hypertension: Secondary | ICD-10-CM

## 2023-01-29 DIAGNOSIS — E782 Mixed hyperlipidemia: Secondary | ICD-10-CM

## 2023-01-29 DIAGNOSIS — E1122 Type 2 diabetes mellitus with diabetic chronic kidney disease: Secondary | ICD-10-CM | POA: Diagnosis not present

## 2023-01-29 DIAGNOSIS — R946 Abnormal results of thyroid function studies: Secondary | ICD-10-CM

## 2023-01-29 DIAGNOSIS — E559 Vitamin D deficiency, unspecified: Secondary | ICD-10-CM

## 2023-01-29 DIAGNOSIS — F4521 Hypochondriasis: Secondary | ICD-10-CM

## 2023-01-29 DIAGNOSIS — E538 Deficiency of other specified B group vitamins: Secondary | ICD-10-CM

## 2023-01-29 DIAGNOSIS — Z6841 Body Mass Index (BMI) 40.0 and over, adult: Secondary | ICD-10-CM

## 2023-01-29 LAB — POCT URINALYSIS DIPSTICK
Bilirubin, UA: NEGATIVE
Blood, UA: POSITIVE
Glucose, UA: NEGATIVE
Ketones, UA: NEGATIVE
Nitrite, UA: NEGATIVE
Protein, UA: POSITIVE — AB
Spec Grav, UA: <=1.005 — AB (ref 1.010–1.025)
Urobilinogen, UA: 0.2 U/dL
pH, UA: 5.5 (ref 5.0–8.0)

## 2023-01-29 LAB — GLUCOSE, POCT (MANUAL RESULT ENTRY): POC Glucose: 168 mg/dL — AB (ref 70–99)

## 2023-01-29 LAB — POC CREATINE & ALBUMIN,URINE
Albumin/Creatinine Ratio, Urine, POC: >300
Creatinine, POC: 50 mg/dL
Microalbumin Ur, POC: 150 mg/L

## 2023-01-29 MED ORDER — METOPROLOL SUCCINATE ER 50 MG PO TB24: 50.0000 mg | ORAL_TABLET | Freq: Every day | ORAL | 1 refills | Status: DC

## 2023-01-29 MED ORDER — PREDNISONE 10 MG PO TABS
10.0000 mg | ORAL_TABLET | Freq: Every day | ORAL | 0 refills | Status: DC
Start: 2023-01-29 — End: 2023-05-25

## 2023-01-29 MED ORDER — FLUTICASONE PROPIONATE 50 MCG/ACT NA SUSP: 1.0000 | Freq: Two times a day (BID) | NASAL | 3 refills | Status: AC

## 2023-01-29 MED ORDER — BUSPIRONE HCL 5 MG PO TABS: 5.0000 mg | ORAL_TABLET | Freq: Two times a day (BID) | ORAL | 0 refills | Status: DC

## 2023-01-29 NOTE — Progress Notes (Signed)
 Established Patient Office Visit  Subjective:  Patient ID: Claire Hansen, female    DOB: 1960-06-23  Age: 63 y.o. MRN: [id number]  Chief Complaint  Patient presents with   Acute Visit    Ringing in ears    Patient is here today for follow up.  She is having continued issues with her tinnitus.  She also has been continuing to have trouble with her swallowing due to the issues with her thyroid .  She is feeling very poorly, anxious, has been having high blood pressure and heart palpitations.   She has her appointment on January 15th with [facility] - Dr. Denys for further evaluation of the thyroid  nodules.   Due for labs today.  Needs refills.   No other concerns today.    No other concerns at this time.   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    Past Surgical History:  Procedure Laterality Date   BACK SURGERY  07/2020   BREAST SURGERY Right    Cyst removed   CESAREAN SECTION     x2   CHOLECYSTECTOMY     CYSTOSCOPY  01/10/2021   Procedure: CYSTOSCOPY;  Surgeon: Schermerhorn, Debby PARAS, MD;  Location: [facility];  Service: Gynecology;;   DILATION AND CURETTAGE OF UTERUS     Treatment of Metorrhagia and Endometrial Ablation   PERONEAL NERVE DECOMPRESSION Left 04/29/2020   Procedure: PERONEAL NERVE DECOMPRESSION;  Surgeon: Bluford Standing, MD;  Location: [facility];  Service: Neurosurgery;  Laterality: Left;   SUPRACERVICAL ABDOMINAL  HYSTERECTOMY Bilateral 01/10/2021   Procedure: HYSTERECTOMY ABDOMINAL WITH  BILATERA SALPINGO OOPHORECTOMY;  Surgeon: Schermerhorn, Debby PARAS, MD;  Location: [facility];  Service: Gynecology;  Laterality: Bilateral;   TONSILLECTOMY     TOTAL HIP ARTHROPLASTY Right 11/05/2016   Procedure: TOTAL HIP ARTHROPLASTY ANTERIOR APPROACH;  Surgeon: Kathlynn Sharper, MD;  Location: [facility];  Service: Orthopedics;  Laterality: Right;   TOTAL HIP ARTHROPLASTY Left 07/01/2017   Procedure: TOTAL HIP ARTHROPLASTY ANTERIOR APPROACH;  Surgeon: Kathlynn Sharper, MD;  Location: [facility];  Service: Orthopedics;  Laterality: Left;   TOTAL HIP REVISION Left 05/03/2020   Procedure: LEFT FEMORAL COMPONENT REVISION;  Surgeon: Kathlynn Sharper, MD;  Location: [facility];  Service: Orthopedics;  Laterality: Left;    Social History   Socioeconomic History   Marital status: Married    Spouse name: Not on file   Number of children: Not on file   Years of education: Not on file   Highest education level: Not on file  Occupational History   Not on file  Tobacco Use   Smoking status: Never   Smokeless tobacco: Never  Vaping Use   Vaping status: Never Used  Substance and Sexual Activity   Alcohol use: No    Alcohol/week: 0.0 standard drinks of alcohol   Drug use: No   Sexual activity: Yes  Partners: Male  Other Topics Concern   Not on file  Social History Narrative   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    1.5 yrs college    Caffeine-    Exercises 3 x weekly    Social Drivers of Health   Financial Resource Strain: Not on file  Food Insecurity: Not on file  Transportation Needs: No Transportation Needs (08/13/2020)   Received from [facility], [facility]   PRAPARE - Transportation    Lack of Transportation (Medical): No    Lack of Transportation (Non-Medical): No  Physical Activity: Not on file  Stress: Not on file  Social Connections: Not on file  Intimate Partner  Violence: Not on file    Family History  Problem Relation Age of Onset   Asthma Son     Allergies  Allergen Reactions   Shellfish Allergy Anaphylaxis and Nausea And Vomiting    Topical betadine  is OK to use.   Statins Other (See Comments)    Muscle pain/aches, can tolerate rosuvastatin     Review of Systems  Constitutional:  Positive for malaise/fatigue.  HENT:  Positive for ear pain and tinnitus.   Respiratory:  Positive for shortness of breath.   Cardiovascular:  Positive for palpitations.  Gastrointestinal:        Swallowing difficulty  Musculoskeletal:  Positive for neck pain.  Neurological:  Positive for dizziness.  Psychiatric/Behavioral:  The patient is nervous/anxious and has insomnia.        Objective:   BP (!) 140/90   Pulse (!) 132   Ht 5' 5 (1.651 m)   Wt 274 lb (124.3 kg)   LMP 06/04/2016   SpO2 94%   BMI 45.60 kg/m   Vitals:   01/29/23 0945  BP: (!) 140/90  Pulse: (!) 132  Height: 5' 5 (1.651 m)  Weight: 274 lb (124.3 kg)  SpO2: 94%  BMI (Calculated): 45.6    Physical Exam Vitals and nursing note reviewed.  Constitutional:      Appearance: Normal appearance. She is normal weight.  HENT:     Head: Normocephalic.     Right Ear: A middle ear effusion is present.     Left Ear: A middle ear effusion is present. Tympanic membrane is bulging.  Eyes:     General: Lids are normal.     Extraocular Movements: Extraocular movements intact.     Conjunctiva/sclera: Conjunctivae normal.     Pupils: Pupils are equal, round, and reactive to light.  Neck:     Thyroid : Thyroid  mass present.   Cardiovascular:     Rate and Rhythm: Regular rhythm. Tachycardia present.     Pulses: Normal pulses.  Pulmonary:     Effort: Pulmonary effort is normal.  Neurological:     General: No focal deficit present.     Mental Status: She is alert and oriented to person, place, and time. Mental status is at baseline.  Psychiatric:        Attention and Perception:  Perception normal. She is inattentive.        Mood and Affect: Mood is anxious. Affect is labile.        Speech: Speech normal.        Behavior: Behavior is agitated (unable to sit still at all during visit).        Thought Content: Thought content normal.        Cognition and Memory: Cognition and memory normal.  Judgment: Judgment normal.     Comments: Extreme anxiety about upcoming visit at [facility]       Results for orders placed or performed in visit on 01/29/23  Lipid panel  Result Value Ref Range   Cholesterol, Total 256 (H) 100 - 199 mg/dL   Triglycerides 802 (H) 0 - 149 mg/dL   HDL 46 >60 mg/dL   VLDL Cholesterol Cal 37 5 - 40 mg/dL   LDL Chol Calc (NIH) 826 (H) 0 - 99 mg/dL   Chol/HDL Ratio 5.6 (H) 0.0 - 4.4 ratio  VITAMIN D  25 Hydroxy (Vit-D Deficiency, Fractures)  Result Value Ref Range   Vit D, 25-Hydroxy 24.4 (L) 30.0 - 100.0 ng/mL  CMP14+EGFR  Result Value Ref Range   Glucose 159 (H) 70 - 99 mg/dL   BUN 16 8 - 27 mg/dL   Creatinine, Ser 8.97 (H) 0.57 - 1.00 mg/dL   eGFR 63 >40 fO/fpw/8.26   BUN/Creatinine Ratio 16 12 - 28   Sodium 142 134 - 144 mmol/L   Potassium 4.1 3.5 - 5.2 mmol/L   Chloride 100 96 - 106 mmol/L   CO2 21 20 - 29 mmol/L   Calcium  10.7 (H) 8.7 - 10.3 mg/dL   Total Protein 7.7 6.0 - 8.5 g/dL   Albumin 5.0 (H) 3.8 - 4.9 g/dL   Globulin, Total 2.7 1.5 - 4.5 g/dL   Bilirubin Total 0.3 0.0 - 1.2 mg/dL   Alkaline Phosphatase 113 44 - 121 IU/L   AST 27 0 - 40 IU/L   ALT 32 0 - 32 IU/L  TSH  Result Value Ref Range   TSH 1.110 0.450 - 4.500 uIU/mL  Hemoglobin A1c  Result Value Ref Range   Hgb A1c MFr Bld 9.2 (H) 4.8 - 5.6 %   Est. average glucose Bld gHb Est-mCnc 217 mg/dL  Vitamin A87  Result Value Ref Range   Vitamin B-12 482 232 - 1,245 pg/mL  CBC with Diff  Result Value Ref Range   WBC 8.9 3.4 - 10.8 x10E3/uL   RBC 5.17 3.77 - 5.28 x10E6/uL   Hemoglobin 14.8 11.1 - 15.9 g/dL   Hematocrit 54.8 65.9 - 46.6 %   MCV 87 79 - 97 fL    MCH 28.6 26.6 - 33.0 pg   MCHC 32.8 31.5 - 35.7 g/dL   RDW 85.7 88.2 - 84.5 %   Platelets 306 150 - 450 x10E3/uL   Neutrophils 53 Not Estab. %   Lymphs 37 Not Estab. %   Monocytes 6 Not Estab. %   Eos 4 Not Estab. %   Basos 0 Not Estab. %   Neutrophils Absolute 4.7 1.4 - 7.0 x10E3/uL   Lymphocytes Absolute 3.3 (H) 0.7 - 3.1 x10E3/uL   Monocytes Absolute 0.5 0.1 - 0.9 x10E3/uL   EOS (ABSOLUTE) 0.3 0.0 - 0.4 x10E3/uL   Basophils Absolute 0.0 0.0 - 0.2 x10E3/uL   Immature Granulocytes 0 Not Estab. %   Immature Grans (Abs) 0.0 0.0 - 0.1 x10E3/uL  POCT Glucose (CBG)  Result Value Ref Range   POC Glucose 168 (A) 70 - 99 mg/dl  POC CREATINE & ALBUMIN,URINE  Result Value Ref Range   Microalbumin Ur, POC 150 mg/L   Creatinine, POC 50 mg/dL   Albumin/Creatinine Ratio, Urine, POC >300   POCT Urinalysis Dipstick (18997)  Result Value Ref Range   Color, UA yellow    Clarity, UA slightly cloudy    Glucose, UA Negative Negative   Bilirubin, UA negative  Ketones, UA negative    Spec Grav, UA <=1.005 (A) 1.010 - 1.025   Blood, UA positive    pH, UA 5.5 5.0 - 8.0   Protein, UA Positive (A) Negative   Urobilinogen, UA 0.2 0.2 or 1.0 E.U./dL   Nitrite, UA negative    Leukocytes, UA     Appearance     Odor      Recent Results (from the past 2160 hours)  POCT Glucose (CBG)     Status: Abnormal   Collection Time: 01/29/23  9:54 AM  Result Value Ref Range   POC Glucose 168 (A) 70 - 99 mg/dl  Lipid panel     Status: Abnormal   Collection Time: 01/29/23 11:17 AM  Result Value Ref Range   Cholesterol, Total 256 (H) 100 - 199 mg/dL   Triglycerides 802 (H) 0 - 149 mg/dL   HDL 46 >60 mg/dL   VLDL Cholesterol Cal 37 5 - 40 mg/dL   LDL Chol Calc (NIH) 826 (H) 0 - 99 mg/dL   Chol/HDL Ratio 5.6 (H) 0.0 - 4.4 ratio    Comment:                                   T. Chol/HDL Ratio                                             Men  Women                               1/2 Avg.Risk  3.4    3.3                                    Avg.Risk  5.0    4.4                                2X Avg.Risk  9.6    7.1                                3X Avg.Risk 23.4   11.0   VITAMIN D  25 Hydroxy (Vit-D Deficiency, Fractures)     Status: Abnormal   Collection Time: 01/29/23 11:17 AM  Result Value Ref Range   Vit D, 25-Hydroxy 24.4 (L) 30.0 - 100.0 ng/mL    Comment: Vitamin D  deficiency has been defined by the Institute of Medicine and an Endocrine Society practice guideline as a level of serum 25-OH vitamin D  less than 20 ng/mL (1,2). The Endocrine Society went on to further define vitamin D  insufficiency as a level between 21 and 29 ng/mL (2). 1. IOM (Institute of Medicine). 2010. Dietary reference    intakes for calcium  and D. [place]: The    [employer]. 2. Holick MF, Binkley [place], Bischoff-Ferrari HA, et al.    Evaluation, treatment, and prevention of vitamin D     deficiency: an Endocrine Society clinical practice    guideline. JCEM. 2011 Jul; 96(7):1911-30.   CMP14+EGFR     Status: Abnormal   Collection Time: 01/29/23 11:17 AM  Result Value Ref Range   Glucose 159 (H) 70 - 99 mg/dL   BUN 16 8 - 27 mg/dL   Creatinine, Ser 8.97 (H) 0.57 - 1.00 mg/dL   eGFR 63 >40 fO/fpw/8.26   BUN/Creatinine Ratio 16 12 - 28   Sodium 142 134 - 144 mmol/L   Potassium 4.1 3.5 - 5.2 mmol/L   Chloride 100 96 - 106 mmol/L   CO2 21 20 - 29 mmol/L   Calcium  10.7 (H) 8.7 - 10.3 mg/dL   Total Protein 7.7 6.0 - 8.5 g/dL   Albumin 5.0 (H) 3.8 - 4.9 g/dL   Globulin, Total 2.7 1.5 - 4.5 g/dL   Bilirubin Total 0.3 0.0 - 1.2 mg/dL   Alkaline Phosphatase 113 44 - 121 IU/L   AST 27 0 - 40 IU/L   ALT 32 0 - 32 IU/L  TSH     Status: None   Collection Time: 01/29/23 11:17 AM  Result Value Ref Range   TSH 1.110 0.450 - 4.500 uIU/mL  Hemoglobin A1c     Status: Abnormal   Collection Time: 01/29/23 11:17 AM  Result Value Ref Range   Hgb A1c MFr Bld 9.2 (H) 4.8 - 5.6 %    Comment:          Prediabetes:  5.7 - 6.4          Diabetes: >6.4          Glycemic control for adults with diabetes: <7.0    Est. average glucose Bld gHb Est-mCnc 217 mg/dL  Vitamin B12     Status: None   Collection Time: 01/29/23 11:17 AM  Result Value Ref Range   Vitamin B-12 482 232 - 1,245 pg/mL  CBC with Diff     Status: Abnormal   Collection Time: 01/29/23 11:17 AM  Result Value Ref Range   WBC 8.9 3.4 - 10.8 x10E3/uL   RBC 5.17 3.77 - 5.28 x10E6/uL   Hemoglobin 14.8 11.1 - 15.9 g/dL   Hematocrit 54.8 65.9 - 46.6 %   MCV 87 79 - 97 fL   MCH 28.6 26.6 - 33.0 pg   MCHC 32.8 31.5 - 35.7 g/dL   RDW 85.7 88.2 - 84.5 %   Platelets 306 150 - 450 x10E3/uL   Neutrophils 53 Not Estab. %   Lymphs 37 Not Estab. %   Monocytes 6 Not Estab. %   Eos 4 Not Estab. %   Basos 0 Not Estab. %   Neutrophils Absolute 4.7 1.4 - 7.0 x10E3/uL   Lymphocytes Absolute 3.3 (H) 0.7 - 3.1 x10E3/uL   Monocytes Absolute 0.5 0.1 - 0.9 x10E3/uL   EOS (ABSOLUTE) 0.3 0.0 - 0.4 x10E3/uL   Basophils Absolute 0.0 0.0 - 0.2 x10E3/uL   Immature Granulocytes 0 Not Estab. %   Immature Grans (Abs) 0.0 0.0 - 0.1 x10E3/uL  POC CREATINE & ALBUMIN,URINE     Status: Abnormal   Collection Time: 01/29/23 11:18 AM  Result Value Ref Range   Microalbumin Ur, POC 150 mg/L   Creatinine, POC 50 mg/dL   Albumin/Creatinine Ratio, Urine, POC >300   POCT Urinalysis Dipstick (18997)     Status: Abnormal   Collection Time: 01/29/23 12:16 PM  Result Value Ref Range   Color, UA yellow    Clarity, UA slightly cloudy    Glucose, UA Negative Negative   Bilirubin, UA negative    Ketones, UA negative    Spec Grav, UA <=1.005 (A) 1.010 - 1.025   Blood, UA positive  pH, UA 5.5 5.0 - 8.0   Protein, UA Positive (A) Negative   Urobilinogen, UA 0.2 0.2 or 1.0 E.U./dL   Nitrite, UA negative    Leukocytes, UA     Appearance     Odor         Assessment & Plan:   Problem List Items Addressed This Visit       Cardiovascular and Mediastinum   Essential  hypertension   Increasing Metoprolol .  Highly suspect that pt BP and pulse are elevated as a result of both her anxiety and the thyroid  issues.  Will increase this to help with her pulse.  Continue current other therapy.  Will reassess at follow up.        Relevant Medications   metoprolol  succinate (TOPROL -XL) 50 MG 24 hr tablet   Other Relevant Orders   CMP14+EGFR (Completed)   CBC with Diff (Completed)     Endocrine   Type 2 diabetes mellitus with chronic kidney disease, without long-term current use of insulin , unspecified CKD stage (HCC) - Primary   Checking labs today. Will call pt. With results  Continue current diabetes POC, as patient has been well controlled on current regimen.  Will adjust meds if needed based on labs.        Relevant Orders   POCT Glucose (CBG) (Completed)   CMP14+EGFR (Completed)   Hemoglobin A1c (Completed)   CBC with Diff (Completed)   POC CREATINE & ALBUMIN,URINE (Completed)   POCT Urinalysis Dipstick (18997) (Completed)     Other   Hyperlipidemia   Checking labs today.  Continue current therapy for lipid control. Will modify as needed based on labwork results.        Relevant Medications   metoprolol  succinate (TOPROL -XL) 50 MG 24 hr tablet   Other Relevant Orders   Lipid panel (Completed)   CMP14+EGFR (Completed)   CBC with Diff (Completed)   POC CREATINE & ALBUMIN,URINE (Completed)   BMI 45.0-49.9, adult (HCC)   Continue current meds.  Will adjust as needed based on results.  The patient is asked to make an attempt to improve diet and exercise patterns to aid in medical management of this problem. Addressed importance of increasing and maintaining water  intake.        Relevant Orders   CMP14+EGFR (Completed)   CBC with Diff (Completed)   Abnormal results of thyroid  function studies   Relevant Orders   CMP14+EGFR (Completed)   TSH (Completed)   CBC with Diff (Completed)   B12 deficiency due to diet   Checking labs  today.  Will continue supplements as needed.        Relevant Orders   CMP14+EGFR (Completed)   Vitamin B12 (Completed)   CBC with Diff (Completed)   Vitamin D  deficiency, unspecified   Checking labs today.  Will continue supplements as needed.        Relevant Orders   VITAMIN D  25 Hydroxy (Vit-D Deficiency, Fractures) (Completed)   CMP14+EGFR (Completed)   CBC with Diff (Completed)   Illness anxiety disorder   Sending buspirone  for patient.  Will reassess at follow up appointment.        Return in about 1 month (around 03/01/2023) for F/U.   Total time spent: 30 minutes  ALAN [name] ARRANT, FNP  01/29/2023   This document may have been prepared by [facility] Voice Recognition software and as such may include unintentional dictation errors.

## 2023-01-30 ENCOUNTER — Encounter: Payer: Self-pay | Admitting: Family

## 2023-01-30 DIAGNOSIS — F4521 Hypochondriasis: Secondary | ICD-10-CM | POA: Insufficient documentation

## 2023-01-30 LAB — LIPID PANEL
Chol/HDL Ratio: 5.6 {ratio} — ABNORMAL HIGH (ref 0.0–4.4)
Cholesterol, Total: 256 mg/dL — ABNORMAL HIGH (ref 100–199)
HDL: 46 mg/dL (ref >39)
LDL Chol Calc (NIH): 173 mg/dL — ABNORMAL HIGH (ref 0–99)
Triglycerides: 197 mg/dL — ABNORMAL HIGH (ref 0–149)
VLDL Cholesterol Cal: 37 mg/dL (ref 5–40)

## 2023-01-30 LAB — CMP14+EGFR
ALT: 32 [IU]/L (ref 0–32)
AST: 27 [IU]/L (ref 0–40)
Albumin: 5 g/dL — ABNORMAL HIGH (ref 3.8–4.9)
Alkaline Phosphatase: 113 [IU]/L (ref 44–121)
BUN/Creatinine Ratio: 16 (ref 12–28)
BUN: 16 mg/dL (ref 8–27)
Bilirubin Total: 0.3 mg/dL (ref 0.0–1.2)
CO2: 21 mmol/L (ref 20–29)
Calcium: 10.7 mg/dL — ABNORMAL HIGH (ref 8.7–10.3)
Chloride: 100 mmol/L (ref 96–106)
Creatinine, Ser: 1.02 mg/dL — ABNORMAL HIGH (ref 0.57–1.00)
Globulin, Total: 2.7 g/dL (ref 1.5–4.5)
Glucose: 159 mg/dL — ABNORMAL HIGH (ref 70–99)
Potassium: 4.1 mmol/L (ref 3.5–5.2)
Sodium: 142 mmol/L (ref 134–144)
Total Protein: 7.7 g/dL (ref 6.0–8.5)
eGFR: 63 mL/min/{1.73_m2} (ref >59)

## 2023-01-30 LAB — CBC WITH DIFFERENTIAL/PLATELET
Basophils Absolute: 0 10*3/uL (ref 0.0–0.2)
Basos: 0 %
EOS (ABSOLUTE): 0.3 10*3/uL (ref 0.0–0.4)
Eos: 4 %
Hematocrit: 45.1 % (ref 34.0–46.6)
Hemoglobin: 14.8 g/dL (ref 11.1–15.9)
Immature Grans (Abs): 0 10*3/uL (ref 0.0–0.1)
Immature Granulocytes: 0 %
Lymphocytes Absolute: 3.3 10*3/uL — ABNORMAL HIGH (ref 0.7–3.1)
Lymphs: 37 %
MCH: 28.6 pg (ref 26.6–33.0)
MCHC: 32.8 g/dL (ref 31.5–35.7)
MCV: 87 fL (ref 79–97)
Monocytes Absolute: 0.5 10*3/uL (ref 0.1–0.9)
Monocytes: 6 %
Neutrophils Absolute: 4.7 10*3/uL (ref 1.4–7.0)
Neutrophils: 53 %
Platelets: 306 10*3/uL (ref 150–450)
RBC: 5.17 x10E6/uL (ref 3.77–5.28)
RDW: 14.2 % (ref 11.7–15.4)
WBC: 8.9 10*3/uL (ref 3.4–10.8)

## 2023-01-30 LAB — VITAMIN B12: Vitamin B-12: 482 pg/mL (ref 232–1245)

## 2023-01-30 LAB — HEMOGLOBIN A1C
Est. average glucose Bld gHb Est-mCnc: 217 mg/dL
Hgb A1c MFr Bld: 9.2 % — ABNORMAL HIGH (ref 4.8–5.6)

## 2023-01-30 LAB — VITAMIN D 25 HYDROXY (VIT D DEFICIENCY, FRACTURES): Vit D, 25-Hydroxy: 24.4 ng/mL — ABNORMAL LOW (ref 30.0–100.0)

## 2023-01-30 LAB — TSH: TSH: 1.11 u[IU]/mL (ref 0.450–4.500)

## 2023-01-30 NOTE — Assessment & Plan Note (Signed)
 Sending buspirone for patient.  Will reassess at follow up appointment.

## 2023-01-30 NOTE — Assessment & Plan Note (Signed)
 Continue current meds.  Will adjust as needed based on results.  The patient is asked to make an attempt to improve diet and exercise patterns to aid in medical management of this problem. Addressed importance of increasing and maintaining water intake.

## 2023-01-30 NOTE — Assessment & Plan Note (Signed)
 Checking labs today.  Continue current therapy for lipid control. Will modify as needed based on labwork results.

## 2023-01-30 NOTE — Assessment & Plan Note (Signed)
 Checking labs today. Will call pt. With results  Continue current diabetes POC, as patient has been well controlled on current regimen.  Will adjust meds if needed based on labs.

## 2023-01-30 NOTE — Assessment & Plan Note (Signed)
 Checking labs today.  Will continue supplements as needed.

## 2023-01-30 NOTE — Assessment & Plan Note (Addendum)
 Increasing Metoprolol.  Highly suspect that pt BP and pulse are elevated as a result of both her anxiety and the thyroid issues.  Will increase this to help with her pulse.  Continue current other therapy.  Will reassess at follow up.

## 2023-02-05 ENCOUNTER — Other Ambulatory Visit: Payer: Self-pay

## 2023-02-05 ENCOUNTER — Encounter: Payer: Self-pay | Admitting: Family

## 2023-02-05 MED ORDER — AZITHROMYCIN 250 MG PO TABS: ORAL_TABLET | ORAL | 0 refills | Status: AC

## 2023-02-07 ENCOUNTER — Other Ambulatory Visit: Payer: Self-pay | Admitting: Family

## 2023-02-08 ENCOUNTER — Encounter: Payer: Self-pay | Admitting: Family

## 2023-02-08 ENCOUNTER — Other Ambulatory Visit: Payer: Self-pay

## 2023-02-10 DIAGNOSIS — E042 Nontoxic multinodular goiter: Secondary | ICD-10-CM | POA: Insufficient documentation

## 2023-02-16 ENCOUNTER — Other Ambulatory Visit: Payer: Self-pay

## 2023-02-16 ENCOUNTER — Encounter: Payer: Self-pay | Admitting: Family

## 2023-02-16 MED ORDER — TRAMADOL HCL 50 MG PO TABS: 50.0000 mg | ORAL_TABLET | Freq: Two times a day (BID) | ORAL | 2 refills | Status: DC | PRN

## 2023-02-26 ENCOUNTER — Other Ambulatory Visit: Payer: Self-pay

## 2023-02-26 ENCOUNTER — Encounter: Payer: Self-pay | Admitting: Family

## 2023-02-26 ENCOUNTER — Other Ambulatory Visit: Payer: Self-pay | Admitting: Family

## 2023-02-27 MED ORDER — SPIRONOLACTONE 25 MG PO TABS: 25.0000 mg | ORAL_TABLET | Freq: Every day | ORAL | 3 refills | Status: DC

## 2023-03-14 ENCOUNTER — Other Ambulatory Visit: Payer: Self-pay | Admitting: Family

## 2023-03-14 DIAGNOSIS — E1122 Type 2 diabetes mellitus with diabetic chronic kidney disease: Secondary | ICD-10-CM

## 2023-03-29 ENCOUNTER — Ambulatory Visit: Payer: Managed Care, Other (non HMO) | Admitting: Dietician

## 2023-04-03 ENCOUNTER — Other Ambulatory Visit: Payer: Self-pay | Admitting: Family

## 2023-04-05 HISTORY — PX: OTHER SURGICAL HISTORY: SHX169

## 2023-04-22 ENCOUNTER — Encounter: Payer: Self-pay | Admitting: Family

## 2023-04-29 ENCOUNTER — Ambulatory Visit: Payer: Managed Care, Other (non HMO) | Admitting: Family

## 2023-04-30 ENCOUNTER — Other Ambulatory Visit: Payer: Self-pay | Admitting: Family

## 2023-04-30 DIAGNOSIS — E1122 Type 2 diabetes mellitus with diabetic chronic kidney disease: Secondary | ICD-10-CM

## 2023-05-03 ENCOUNTER — Other Ambulatory Visit: Payer: Self-pay | Admitting: Family

## 2023-05-03 DIAGNOSIS — Z1231 Encounter for screening mammogram for malignant neoplasm of breast: Secondary | ICD-10-CM

## 2023-05-08 ENCOUNTER — Other Ambulatory Visit: Payer: Self-pay | Admitting: Family

## 2023-05-08 DIAGNOSIS — J3089 Other allergic rhinitis: Secondary | ICD-10-CM

## 2023-05-13 ENCOUNTER — Ambulatory Visit: Admitting: Family

## 2023-05-21 ENCOUNTER — Other Ambulatory Visit: Payer: Self-pay

## 2023-05-21 ENCOUNTER — Encounter: Payer: Self-pay | Admitting: Family

## 2023-05-22 ENCOUNTER — Other Ambulatory Visit: Payer: Self-pay | Admitting: Family

## 2023-05-25 ENCOUNTER — Ambulatory Visit (INDEPENDENT_AMBULATORY_CARE_PROVIDER_SITE_OTHER): Admitting: Family

## 2023-05-25 ENCOUNTER — Encounter: Payer: Self-pay | Admitting: Family

## 2023-05-25 VITALS — BP 142/74 | HR 66 | Ht 65.0 in | Wt 265.4 lb

## 2023-05-25 DIAGNOSIS — E782 Mixed hyperlipidemia: Secondary | ICD-10-CM

## 2023-05-25 DIAGNOSIS — E538 Deficiency of other specified B group vitamins: Secondary | ICD-10-CM

## 2023-05-25 DIAGNOSIS — Z6841 Body Mass Index (BMI) 40.0 and over, adult: Secondary | ICD-10-CM | POA: Diagnosis not present

## 2023-05-25 DIAGNOSIS — E1122 Type 2 diabetes mellitus with diabetic chronic kidney disease: Secondary | ICD-10-CM | POA: Diagnosis not present

## 2023-05-25 DIAGNOSIS — R946 Abnormal results of thyroid function studies: Secondary | ICD-10-CM

## 2023-05-25 DIAGNOSIS — Z0001 Encounter for general adult medical examination with abnormal findings: Secondary | ICD-10-CM | POA: Diagnosis not present

## 2023-05-25 DIAGNOSIS — I10 Essential (primary) hypertension: Secondary | ICD-10-CM | POA: Diagnosis not present

## 2023-05-25 DIAGNOSIS — Z0000 Encounter for general adult medical examination without abnormal findings: Secondary | ICD-10-CM

## 2023-05-25 DIAGNOSIS — E559 Vitamin D deficiency, unspecified: Secondary | ICD-10-CM

## 2023-05-25 NOTE — Assessment & Plan Note (Signed)
 Checking labs today.  Will continue supplements as needed.

## 2023-05-25 NOTE — Assessment & Plan Note (Signed)
 Continue current meds.  Will adjust as needed based on results.  The patient is asked to make an attempt to improve diet and exercise patterns to aid in medical management of this problem. Addressed importance of increasing and maintaining water intake.

## 2023-05-25 NOTE — Progress Notes (Signed)
 Complete physical exam  Patient: Claire Hansen   DOB: 04-Aug-1960   63 y.o. Female  MRN: [id number]  Subjective:    Chief Complaint  Patient presents with   Annual Exam    CPE    Claire Hansen is a 63 y.o. female who presents today for a complete physical exam. She reports consuming a general diet. The patient does not participate in regular exercise at present. She generally feels fairly well. She reports sleeping fairly well. She does have additional problems to discuss today.   She has been to see Endo and ENT Surgery, they have started her on Mounjaro and she did have her surgery. She is still having some swelling in her neck from the procedure.  She is very anxious about this, and her healing.   Most recent fall risk assessment:    08/14/2014    8:31 AM  Fall Risk   Falls in the past year? No     Most recent depression screenings:    05/25/2023    9:00 AM 03/04/2022   10:24 AM  PHQ 2/9 Scores  PHQ - 2 Score 0 0  PHQ- 9 Score 0     Past Medical History:  Diagnosis Date   Abnormal results of thyroid  function studies 03/04/2022   Abnormal results of thyroid  function studies 03/04/2022   Acute CVA (cerebrovascular accident) (HCC) 06/09/2021   Allergy    Anxiety    Asthma    pt denies having asthma   Chronic osteoarthritis    knees   Deviated septum    Diabetes mellitus type 2, uncomplicated (HCC) 03/04/2022   Diabetes mellitus without complication (HCC)    Fever postop 03/04/2022   History of asthma 08/13/2014   Hyperlipidemia    Hypertension    Irregular menstrual cycle    Leg pain, anterior, left 03/22/2020   Lymphadenopathy    Obesity    Peri-menopause    Pneumonia 2015   Post-operative state 01/10/2021   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    Past Surgical History:  Procedure Laterality Date   BACK SURGERY  07/2020   BREAST SURGERY Right    Cyst removed   CESAREAN SECTION     x2   CHOLECYSTECTOMY     CYSTOSCOPY  01/10/2021   Procedure:  CYSTOSCOPY;  Surgeon: Schermerhorn, Joselyn Nicely, MD;  Location: [facility];  Service: Gynecology;;   DILATION AND CURETTAGE OF UTERUS     Treatment of Metorrhagia and Endometrial Ablation   JOINT REPLACEMENT     PERONEAL NERVE DECOMPRESSION Left 04/29/2020   Procedure: PERONEAL NERVE DECOMPRESSION;  Surgeon: Berta Brittle, MD;  Location: [facility];  Service: Neurosurgery;  Laterality: Left;   SUPRACERVICAL ABDOMINAL HYSTERECTOMY Bilateral 01/10/2021   Procedure: HYSTERECTOMY ABDOMINAL WITH  BILATERA SALPINGO OOPHORECTOMY;  Surgeon: Schermerhorn, Joselyn Nicely, MD;  Location: [facility];  Service: Gynecology;  Laterality: Bilateral;   Thyroid   04/05/2023   TONSILLECTOMY     TOTAL HIP ARTHROPLASTY Right 11/05/2016   Procedure: TOTAL HIP ARTHROPLASTY ANTERIOR APPROACH;  Surgeon: Molli Angelucci, MD;  Location: [facility];  Service: Orthopedics;  Laterality: Right;   TOTAL HIP ARTHROPLASTY Left 07/01/2017   Procedure: TOTAL HIP ARTHROPLASTY ANTERIOR APPROACH;  Surgeon: Molli Angelucci, MD;  Location: [facility];  Service: Orthopedics;  Laterality: Left;   TOTAL HIP REVISION Left 05/03/2020   Procedure: LEFT FEMORAL COMPONENT REVISION;  Surgeon: Molli Angelucci, MD;  Location: [facility];  Service: Orthopedics;  Laterality: Left;    Family History  Problem Relation Age of Onset   Asthma Son     Social History   Socioeconomic History   Marital status: Married    Spouse name: Not on file   Number of children: Not on file   Years of education: Not on file   Highest education level: Not on file  Occupational History   Not on file  Tobacco Use   Smoking status: Never   Smokeless tobacco: Never  Vaping Use   Vaping status: Never Used  Substance and Sexual Activity   Alcohol use: No    Alcohol/week: 0.0 standard drinks of alcohol   Drug use: No   Sexual activity: Yes    Partners: Male  Other Topics Concern   Not on file  Social History Narrative   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  college    Caffeine-    Exercises 3 x weekly    Social Drivers of Health   Financial Resource Strain: Not on file  Food Insecurity: No Food Insecurity (04/14/2023)   Received from [facility]   Hunger Vital Sign    Worried About Running Out of Food in the Last Year: Never true    Ran Out of Food in the Last Year: Never true  Transportation Needs: No Transportation Needs (04/14/2023)   Received from [facility]   PRAPARE - Transportation    Lack of Transportation (Medical): No    Lack of Transportation (Non-Medical): No  Physical Activity: Not on file  Stress: Not on file  Social Connections: Not on file  Intimate Partner Violence: Patient Unable To Answer (04/14/2023)   Received from [facility]   Humiliation, Afraid, Rape, and Kick questionnaire    Fear of Current or Ex-Partner: Patient unable to answer    Emotionally Abused: Patient unable to answer    Physically Abused: Patient unable to answer    Sexually Abused: Patient unable to answer    Outpatient Medications Prior to Visit  Medication Sig   acetaminophen  (TYLENOL ) 500 MG tablet Take 1,000 mg by mouth every 8 (eight) hours as needed for mild pain or headache.   aspirin  EC 81 MG EC tablet Take 1 tablet (81 mg total) by mouth daily. Swallow whole.   busPIRone  (BUSPAR ) 5 MG tablet Take 1 tablet (5 mg total) by mouth 2 (two) times daily.   cholecalciferol (VITAMIN D3) 25 MCG (1000 UNIT) tablet Take 1,000 Units by mouth daily.   cyanocobalamin (VITAMIN B12) 100 MCG tablet Take 100 mcg by mouth daily.   FARXIGA 10 MG TABS tablet Take 10 mg by mouth daily.   fluticasone  (FLONASE ) 50 MCG/ACT nasal spray Place 1 spray into both nostrils 2 (two) times daily.   gabapentin  (NEURONTIN ) 300 MG capsule TAKE 1 CAPSULE BY MOUTH IN THE  MORNING AND 2 CAPSULES BY MOUTH  AT BEDTIME   glucose blood (CONTOUR NEXT TEST) test strip USE AS INSTRUCTED TO CHECK BLOOD GLUCOSE UP TO TWICE DAILY   hydrALAZINE  (APRESOLINE ) 10 MG tablet TAKE  1 TABLET BY MOUTH ONCE  DAILY   insulin  lispro (HUMALOG ) 100 UNIT/ML injection Inject 0.02 mLs (2 Units total) into the skin 3 (three) times daily before meals. As needed when blood sugar is elevated.   Insulin  Pen Needle (BD PEN NEEDLE NANO U/F) 32G X 4 MM MISC USE IN THE MORNING , AT NOON, IN THE EVENING , AND AT BEDTIME   levothyroxine (SYNTHROID) 150 MCG tablet Take  150 mcg by mouth daily before breakfast.   meloxicam  (MOBIC ) 15 MG tablet TAKE 1 TABLET BY MOUTH DAILY AS  NEEDED FOR PAIN   metFORMIN  (GLUCOPHAGE -XR) 500 MG 24 hr tablet TAKE 1 TABLET BY MOUTH TWICE  DAILY   metoprolol  succinate (TOPROL -XL) 50 MG 24 hr tablet Take 1 tablet (50 mg total) by mouth daily. Take with or immediately following a meal.   montelukast  (SINGULAIR ) 10 MG tablet TAKE 1 TABLET BY MOUTH DAILY AS  NEEDED FOR ALLERGIES   MOUNJARO 7.5 MG/0.5ML Pen Inject 7.5 mg into the skin once a week.   Multiple Vitamin (MULTIVITAMIN) tablet Take 1 tablet by mouth daily.   Olmesartan -amLODIPine -HCTZ 40-10-25 MG TABS TAKE 1 TABLET BY MOUTH ONCE  DAILY   omeprazole (PRILOSEC) 20 MG capsule Take 20 mg by mouth every morning.   rosuvastatin (CRESTOR) 10 MG tablet TAKE 1 TABLET BY MOUTH ONCE  DAILY FOR CHOLESTEROL   spironolactone  (ALDACTONE ) 25 MG tablet Take 1 tablet (25 mg total) by mouth daily.   traMADol  (ULTRAM ) 50 MG tablet TAKE 1 TABLET BY MOUTH EVERY 12 HOURS AS NEEDED   [DISCONTINUED] ofloxacin  (FLOXIN ) 0.3 % OTIC solution Place 5 drops into the left ear daily.   [DISCONTINUED] methocarbamol  (ROBAXIN ) 750 MG tablet TAKE 1 TABLET BY MOUTH 4 TIMES  DAILY AS NEEDED FOR SPASM (Patient not taking: Reported on 05/25/2023)   [DISCONTINUED] ondansetron  (ZOFRAN -ODT) 4 MG disintegrating tablet Allow 1-2 tablets to dissolve in your mouth every 8 hours as needed for nausea/vomiting (Patient not taking: Reported on 05/25/2023)   [DISCONTINUED] predniSONE  (DELTASONE ) 10 MG tablet Take 1 tablet (10 mg total) by mouth daily with breakfast.  (Patient not taking: Reported on 05/25/2023)   [DISCONTINUED] RYBELSUS  7 MG TABS TAKE 1 TABLET BY MOUTH IN THE  MORNING 1/2 HOUR BEFORE ANY  FOOD, WITH 4 OUNCES OF WATER  (Patient not taking: Reported on 05/25/2023)   No facility-administered medications prior to visit.    Review of Systems  All other systems reviewed and are negative.       Objective:     BP (!) 142/74   Pulse 66   Ht 5\' 5"  (1.651 m)   Wt 265 lb 6.4 oz (120.4 kg)   LMP 06/04/2016   SpO2 97%   BMI 44.16 kg/m    Physical Exam Vitals and nursing note reviewed.  Constitutional:      Appearance: Normal appearance. She is normal weight.  HENT:     Head: Normocephalic.  Eyes:     Extraocular Movements: Extraocular movements intact.     Conjunctiva/sclera: Conjunctivae normal.     Pupils: Pupils are equal, round, and reactive to light.  Cardiovascular:     Rate and Rhythm: Normal rate.  Pulmonary:     Effort: Pulmonary effort is normal.  Musculoskeletal:        General: Normal range of motion.  Neurological:     General: No focal deficit present.     Mental Status: She is alert and oriented to person, place, and time. Mental status is at baseline.  Psychiatric:        Mood and Affect: Mood normal.        Behavior: Behavior normal.        Thought Content: Thought content normal.      No results found for any visits on 05/25/23.  No results found for this or any previous visit (from the past 2160 hours).      Assessment & Plan:    Routine  Health Maintenance and Physical Exam  Immunization History  Administered Date(s) Administered   Influenza Whole 10/06/2009   Influenza, Seasonal, Injecte, Preservative Fre 09/27/2011   Influenza-Unspecified 02/20/2014   Pneumococcal Conjugate-13 09/02/2013   Pneumococcal Polysaccharide-23 01/27/2007   Tdap 10/18/2009    Health Maintenance  Topic Date Due   Colonoscopy  Never done   Zoster Vaccines- Shingrix (1 of 2) Never done   Cervical Cancer  Screening (HPV/Pap Cotest)  05/28/2015   FOOT EXAM  08/14/2015   Pneumococcal Vaccine 55-35 Years old (3 of 3 - PCV20 or PCV21) 09/03/2018   DTaP/Tdap/Td (2 - Td or Tdap) 10/19/2019   COVID-19 Vaccine (1 - 2024-25 season) Never done   OPHTHALMOLOGY EXAM  10/30/2022   HEMOGLOBIN A1C  07/29/2023   INFLUENZA VACCINE  08/27/2023   Diabetic kidney evaluation - eGFR measurement  01/29/2024   Diabetic kidney evaluation - Urine ACR  01/29/2024   MAMMOGRAM  07/14/2024   Hepatitis C Screening  Completed   HIV Screening  Completed   HPV VACCINES  Aged Out   Meningococcal B Vaccine  Aged Out    Discussed health benefits of physical activity, and encouraged her to engage in regular exercise appropriate for her age and condition.  Problem List Items Addressed This Visit       Cardiovascular and Mediastinum   Essential hypertension   Blood pressure well controlled with current medications.  Continue current therapy.  Will reassess at follow up.        Relevant Orders   CMP14+EGFR   CBC with Diff     Endocrine   Type 2 diabetes mellitus with chronic kidney disease, without long-term current use of insulin , unspecified CKD stage (HCC)   Checking labs today. Will call pt. With results  Continue current diabetes POC, as patient has been well controlled on current regimen.  Will adjust meds if needed based on labs.        Relevant Medications   FARXIGA 10 MG TABS tablet   MOUNJARO 7.5 MG/0.5ML Pen   Other Relevant Orders   CMP14+EGFR   CBC with Diff   Hemoglobin A1c     Other   Hyperlipidemia   Checking labs today.  Continue current therapy for lipid control. Will modify as needed based on labwork results.        Relevant Orders   CMP14+EGFR   Lipid panel   CBC with Diff   BMI 45.0-49.9, adult (HCC)   Continue current meds.  Will adjust as needed based on results.  The patient is asked to make an attempt to improve diet and exercise patterns to aid in medical management  of this problem. Addressed importance of increasing and maintaining water  intake.        Relevant Medications   FARXIGA 10 MG TABS tablet   MOUNJARO 7.5 MG/0.5ML Pen   Other Relevant Orders   CMP14+EGFR   CBC with Diff   B12 deficiency due to diet   Checking labs today.  Will continue supplements as needed.        Relevant Orders   CMP14+EGFR   Vitamin B12   CBC with Diff   Vitamin D  deficiency, unspecified   Checking labs today.  Will continue supplements as needed.        Relevant Orders   CMP14+EGFR   VITAMIN D  25 Hydroxy (Vit-D Deficiency, Fractures)   CBC with Diff   Other Visit Diagnoses       Routine general medical examination at a health  care facility    -  Primary     Abnormal results of thyroid  function studies       Checking levels today.  Will call with results and forward to her surgeon and her endocrinologist.   Relevant Orders   CMP14+EGFR   CBC with Diff   TSH      Return in about 3 months (around 08/24/2023).     Trenda Frisk, FNP  05/25/2023   This document may have been prepared by [facility] Voice Recognition software and as such may include unintentional dictation errors.

## 2023-05-25 NOTE — Assessment & Plan Note (Signed)
 Checking labs today.  Continue current therapy for lipid control. Will modify as needed based on labwork results.

## 2023-05-25 NOTE — Assessment & Plan Note (Signed)
 Checking labs today. Will call pt. With results  Continue current diabetes POC, as patient has been well controlled on current regimen.  Will adjust meds if needed based on labs.

## 2023-05-25 NOTE — Assessment & Plan Note (Signed)
 Blood pressure well controlled with current medications.  Continue current therapy.  Will reassess at follow up.

## 2023-05-26 LAB — LIPID PANEL
Chol/HDL Ratio: 5.2 ratio — ABNORMAL HIGH (ref 0.0–4.4)
Cholesterol, Total: 229 mg/dL — ABNORMAL HIGH (ref 100–199)
HDL: 44 mg/dL (ref >39)
LDL Chol Calc (NIH): 155 mg/dL — ABNORMAL HIGH (ref 0–99)
Triglycerides: 165 mg/dL — ABNORMAL HIGH (ref 0–149)
VLDL Cholesterol Cal: 30 mg/dL (ref 5–40)

## 2023-05-26 LAB — CBC WITH DIFFERENTIAL/PLATELET
Basophils Absolute: 0 10*3/uL (ref 0.0–0.2)
Basos: 0 %
EOS (ABSOLUTE): 0.3 10*3/uL (ref 0.0–0.4)
Eos: 4 %
Hematocrit: 44.6 % (ref 34.0–46.6)
Hemoglobin: 14.7 g/dL (ref 11.1–15.9)
Immature Grans (Abs): 0 10*3/uL (ref 0.0–0.1)
Immature Granulocytes: 0 %
Lymphocytes Absolute: 3.3 10*3/uL — ABNORMAL HIGH (ref 0.7–3.1)
Lymphs: 37 %
MCH: 28.9 pg (ref 26.6–33.0)
MCHC: 33 g/dL (ref 31.5–35.7)
MCV: 88 fL (ref 79–97)
Monocytes Absolute: 0.4 10*3/uL (ref 0.1–0.9)
Monocytes: 5 %
Neutrophils Absolute: 4.7 10*3/uL (ref 1.4–7.0)
Neutrophils: 54 %
Platelets: 299 10*3/uL (ref 150–450)
RBC: 5.09 x10E6/uL (ref 3.77–5.28)
RDW: 15.5 % — ABNORMAL HIGH (ref 11.7–15.4)
WBC: 8.8 10*3/uL (ref 3.4–10.8)

## 2023-05-26 LAB — CMP14+EGFR
ALT: 21 IU/L (ref 0–32)
AST: 17 IU/L (ref 0–40)
Albumin: 4.8 g/dL (ref 3.9–4.9)
Alkaline Phosphatase: 109 IU/L (ref 44–121)
BUN/Creatinine Ratio: 15 (ref 12–28)
BUN: 23 mg/dL (ref 8–27)
Bilirubin Total: 0.3 mg/dL (ref 0.0–1.2)
CO2: 23 mmol/L (ref 20–29)
Calcium: 10.1 mg/dL (ref 8.7–10.3)
Chloride: 103 mmol/L (ref 96–106)
Creatinine, Ser: 1.49 mg/dL — ABNORMAL HIGH (ref 0.57–1.00)
Globulin, Total: 2.9 g/dL (ref 1.5–4.5)
Glucose: 125 mg/dL — ABNORMAL HIGH (ref 70–99)
Potassium: 4.2 mmol/L (ref 3.5–5.2)
Sodium: 142 mmol/L (ref 134–144)
Total Protein: 7.7 g/dL (ref 6.0–8.5)
eGFR: 40 mL/min/{1.73_m2} — ABNORMAL LOW (ref >59)

## 2023-05-26 LAB — HEMOGLOBIN A1C
Est. average glucose Bld gHb Est-mCnc: 177 mg/dL
Hgb A1c MFr Bld: 7.8 % — ABNORMAL HIGH (ref 4.8–5.6)

## 2023-05-26 LAB — VITAMIN D 25 HYDROXY (VIT D DEFICIENCY, FRACTURES): Vit D, 25-Hydroxy: 33.1 ng/mL (ref 30.0–100.0)

## 2023-05-26 LAB — VITAMIN B12: Vitamin B-12: 466 pg/mL (ref 232–1245)

## 2023-05-26 LAB — TSH: TSH: 10.7 u[IU]/mL — ABNORMAL HIGH (ref 0.450–4.500)

## 2023-05-27 ENCOUNTER — Encounter: Payer: Self-pay | Admitting: Family

## 2023-06-02 ENCOUNTER — Other Ambulatory Visit: Payer: Self-pay

## 2023-06-02 ENCOUNTER — Emergency Department (HOSPITAL_COMMUNITY)

## 2023-06-02 ENCOUNTER — Encounter (HOSPITAL_COMMUNITY): Payer: Self-pay | Admitting: Pharmacy Technician

## 2023-06-02 ENCOUNTER — Emergency Department (HOSPITAL_COMMUNITY)
Admission: EM | Admit: 2023-06-02 | Discharge: 2023-06-02 | Disposition: A | Discharge status: Home / Self Care | Attending: Emergency Medicine | Admitting: Emergency Medicine

## 2023-06-02 DIAGNOSIS — R0789 Other chest pain: Secondary | ICD-10-CM | POA: Diagnosis present

## 2023-06-02 DIAGNOSIS — Z794 Long term (current) use of insulin: Secondary | ICD-10-CM | POA: Diagnosis not present

## 2023-06-02 DIAGNOSIS — E1122 Type 2 diabetes mellitus with diabetic chronic kidney disease: Secondary | ICD-10-CM | POA: Diagnosis not present

## 2023-06-02 DIAGNOSIS — Z7982 Long term (current) use of aspirin: Secondary | ICD-10-CM | POA: Insufficient documentation

## 2023-06-02 DIAGNOSIS — R079 Chest pain, unspecified: Secondary | ICD-10-CM

## 2023-06-02 DIAGNOSIS — Z7984 Long term (current) use of oral hypoglycemic drugs: Secondary | ICD-10-CM | POA: Diagnosis not present

## 2023-06-02 DIAGNOSIS — I129 Hypertensive chronic kidney disease with stage 1 through stage 4 chronic kidney disease, or unspecified chronic kidney disease: Secondary | ICD-10-CM | POA: Insufficient documentation

## 2023-06-02 DIAGNOSIS — Z79899 Other long term (current) drug therapy: Secondary | ICD-10-CM | POA: Diagnosis not present

## 2023-06-02 DIAGNOSIS — R778 Other specified abnormalities of plasma proteins: Secondary | ICD-10-CM | POA: Insufficient documentation

## 2023-06-02 DIAGNOSIS — N1831 Chronic kidney disease, stage 3a: Secondary | ICD-10-CM | POA: Diagnosis not present

## 2023-06-02 LAB — CBC
HCT: 43.2 % (ref 36.0–46.0)
Hemoglobin: 14.6 g/dL (ref 12.0–15.0)
MCH: 29.2 pg (ref 26.0–34.0)
MCHC: 33.8 g/dL (ref 30.0–36.0)
MCV: 86.4 fL (ref 80.0–100.0)
Platelets: 275 10*3/uL (ref 150–400)
RBC: 5 MIL/uL (ref 3.87–5.11)
RDW: 14.8 % (ref 11.5–15.5)
WBC: 9 10*3/uL (ref 4.0–10.5)
nRBC: 0 % (ref 0.0–0.2)

## 2023-06-02 LAB — URINALYSIS, ROUTINE W REFLEX MICROSCOPIC
Bilirubin Urine: NEGATIVE
Glucose, UA: NEGATIVE mg/dL
Ketones, ur: NEGATIVE mg/dL
Leukocytes,Ua: NEGATIVE
Nitrite: NEGATIVE
Protein, ur: 30 mg/dL — AB
Specific Gravity, Urine: 1.01 (ref 1.005–1.030)
pH: 6 (ref 5.0–8.0)

## 2023-06-02 LAB — TROPONIN I (HIGH SENSITIVITY)
Troponin I (High Sensitivity): 48 ng/L — ABNORMAL HIGH (ref <18)
Troponin I (High Sensitivity): 53 ng/L — ABNORMAL HIGH (ref <18)

## 2023-06-02 LAB — URINALYSIS, MICROSCOPIC (REFLEX)

## 2023-06-02 LAB — BASIC METABOLIC PANEL WITH GFR
Anion gap: 10 (ref 5–15)
BUN: 13 mg/dL (ref 8–23)
CO2: 25 mmol/L (ref 22–32)
Calcium: 9.5 mg/dL (ref 8.9–10.3)
Chloride: 104 mmol/L (ref 98–111)
Creatinine, Ser: 1.23 mg/dL — ABNORMAL HIGH (ref 0.44–1.00)
GFR, Estimated: 50 mL/min — ABNORMAL LOW (ref >60)
Glucose, Bld: 120 mg/dL — ABNORMAL HIGH (ref 70–99)
Potassium: 3.9 mmol/L (ref 3.5–5.1)
Sodium: 139 mmol/L (ref 135–145)

## 2023-06-02 LAB — D-DIMER, QUANTITATIVE: D-Dimer, Quant: <0.27 ug{FEU}/mL (ref 0.00–0.50)

## 2023-06-02 LAB — TSH: TSH: 8.693 u[IU]/mL — ABNORMAL HIGH (ref 0.350–4.500)

## 2023-06-02 MED ORDER — IOHEXOL 350 MG/ML SOLN
80.0000 mL | Freq: Once | INTRAVENOUS | Status: AC | PRN
  Administered 2023-06-02: 80 mL via INTRAVENOUS

## 2023-06-02 NOTE — ED Provider Notes (Signed)
 [place] EMERGENCY DEPARTMENT AT [facility] Provider Note   CSN: [id number] Arrival date & time: 06/02/23  1111     History  Chief Complaint  Patient presents with   Chest Pain    Pt Bib ems from doctor's office. Pt c/o cp, sob after exertion. Pt went to pcp for regular visit, reported the cp and was advised to come to ER. PMH: HTN, DM, HLD, Thyroidectomy, CVA    Claire Hansen is a 63 y.o. female.  Pt is a 63 yo female with pmhx significant for arthritis, hld, htn, obesity, dm, cva, and large goiter s/p thyroidectomy.  Pt said she's been having trouble with her calcium  and thyroid  levels since surgery in March.  Today, she had some cp and some sob.  She said the pain comes and goes.  She said it feels like a pressure.  Bp is also elevated and it's usually under control.       Home Medications Prior to Admission medications   Medication Sig Start Date End Date Taking? Authorizing Provider  acetaminophen  (TYLENOL ) 500 MG tablet Take 1,000 mg by mouth every 8 (eight) hours as needed for mild pain or headache. 05/29/13   [provider]  aspirin  EC 81 MG EC tablet Take 1 tablet (81 mg total) by mouth daily. Swallow whole. 06/11/21   Garrison Kanner, MD  busPIRone  (BUSPAR ) 5 MG tablet Take 1 tablet (5 mg total) by mouth 2 (two) times daily. 01/29/23   Trenda Frisk, FNP  cholecalciferol (VITAMIN D3) 25 MCG (1000 UNIT) tablet Take 1,000 Units by mouth daily.    [provider]  cyanocobalamin (VITAMIN B12) 100 MCG tablet Take 100 mcg by mouth daily.    [provider]  FARXIGA 10 MG TABS tablet Take 10 mg by mouth daily. 05/14/23   [provider]  fluticasone  (FLONASE ) 50 MCG/ACT nasal spray Place 1 spray into both nostrils 2 (two) times daily. 01/29/23   Trenda Frisk, FNP  gabapentin  (NEURONTIN ) 300 MG capsule TAKE 1 CAPSULE BY MOUTH IN THE  MORNING AND 2 CAPSULES BY MOUTH  AT BEDTIME 09/11/22   Trenda Frisk, FNP  glucose blood (CONTOUR  NEXT TEST) test strip USE AS INSTRUCTED TO CHECK BLOOD GLUCOSE UP TO TWICE DAILY 03/15/23   Trenda Frisk, FNP  hydrALAZINE  (APRESOLINE ) 10 MG tablet TAKE 1 TABLET BY MOUTH ONCE  DAILY 04/30/23   Trenda Frisk, FNP  insulin  lispro (HUMALOG ) 100 UNIT/ML injection Inject 0.02 mLs (2 Units total) into the skin 3 (three) times daily before meals. As needed when blood sugar is elevated. 10/06/22   Trenda Frisk, FNP  Insulin  Pen Needle (BD PEN NEEDLE NANO U/F) 32G X 4 MM MISC USE IN THE MORNING , AT NOON, IN THE EVENING , AND AT BEDTIME 04/30/23   Trenda Frisk, FNP  levothyroxine (SYNTHROID) 150 MCG tablet Take 150 mcg by mouth daily before breakfast.    [provider]  meloxicam  (MOBIC ) 15 MG tablet TAKE 1 TABLET BY MOUTH DAILY AS  NEEDED FOR PAIN 08/14/22   Trenda Frisk, FNP  metFORMIN  (GLUCOPHAGE -XR) 500 MG 24 hr tablet TAKE 1 TABLET BY MOUTH TWICE  DAILY 04/05/23   Trenda Frisk, FNP  metoprolol  succinate (TOPROL -XL) 50 MG 24 hr tablet Take 1 tablet (50 mg total) by mouth daily. Take with or immediately following a meal. 01/29/23   Trenda Frisk, FNP  montelukast  (SINGULAIR ) 10 MG tablet TAKE 1 TABLET BY  MOUTH DAILY AS  NEEDED FOR ALLERGIES 05/10/23   Trenda Frisk, FNP  MOUNJARO 7.5 MG/0.5ML Pen Inject 7.5 mg into the skin once a week.    [provider]  Multiple Vitamin (MULTIVITAMIN) tablet Take 1 tablet by mouth daily.    [provider]  Olmesartan -amLODIPine -HCTZ 40-10-25 MG TABS TAKE 1 TABLET BY MOUTH ONCE  DAILY 02/09/23   Trenda Frisk, FNP  omeprazole (PRILOSEC) 20 MG capsule Take 20 mg by mouth every morning. 08/24/22   [provider]  rosuvastatin (CRESTOR) 10 MG tablet TAKE 1 TABLET BY MOUTH ONCE  DAILY FOR CHOLESTEROL 05/10/23   Trenda Frisk, FNP  spironolactone  (ALDACTONE ) 25 MG tablet Take 1 tablet (25 mg total) by mouth daily. 02/27/23   Trenda Frisk, FNP  traMADol  (ULTRAM ) 50 MG tablet TAKE 1 TABLET BY MOUTH EVERY  12 HOURS AS NEEDED 05/24/23   Trenda Frisk, FNP      Allergies    Shellfish allergy and Statins    Review of Systems   Review of Systems  All other systems reviewed and are negative.   Physical Exam Updated Vital Signs BP (!) 152/83   Pulse 60   Temp 97.9 F (36.6 C) (Oral)   Resp 11   Ht 5\' 5"  (1.651 m)   Wt 120 kg   LMP 06/04/2016   SpO2 100%   BMI 44.02 kg/m  Physical Exam  ED Results / Procedures / Treatments   Labs (all labs ordered are listed, but only abnormal results are displayed) Labs Reviewed  BASIC METABOLIC PANEL WITH GFR - Abnormal; Notable for the following components:      Result Value   Glucose, Bld 120 (*)    Creatinine, Ser 1.23 (*)    GFR, Estimated 50 (*)    All other components within normal limits  URINALYSIS, ROUTINE W REFLEX MICROSCOPIC - Abnormal; Notable for the following components:   Hgb urine dipstick TRACE (*)    Protein, ur 30 (*)    All other components within normal limits  TSH - Abnormal; Notable for the following components:   TSH 8.693 (*)    All other components within normal limits  URINALYSIS, MICROSCOPIC (REFLEX) - Abnormal; Notable for the following components:   Bacteria, UA RARE (*)    All other components within normal limits  TROPONIN I (HIGH SENSITIVITY) - Abnormal; Notable for the following components:   Troponin I (High Sensitivity) 48 (*)    All other components within normal limits  TROPONIN I (HIGH SENSITIVITY) - Abnormal; Notable for the following components:   Troponin I (High Sensitivity) 53 (*)    All other components within normal limits  CBC  D-DIMER, QUANTITATIVE  PARATHYROID HORMONE, INTACT (NO CA)    EKG EKG Interpretation Date/Time:  Wednesday Jun 02 2023 11:22:43 EDT Ventricular Rate:  66 PR Interval:  215 QRS Duration:  104 QT Interval:  450 QTC Calculation: 472 R Axis:   -60  Text Interpretation: Sinus rhythm Borderline prolonged PR interval Left anterior fascicular block Abnormal  R-wave progression, late transition No significant change since last tracing Confirmed by Sueellen Emery [phone]) on 06/02/2023 11:49:05 AM  Radiology DG Chest Port 1 View Result Date: 06/02/2023 CLINICAL DATA:  cp EXAM: PORTABLE CHEST - 1 VIEW COMPARISON:  March 22, 2020 FINDINGS: Low lung volumes. Bilateral perihilar interstitial opacities with patchy opacities in both lung bases. Mild cardiomegaly tortuous aorta with aortic atherosclerosis. No acute fracture or destructive lesions. Multilevel thoracic osteophytosis. IMPRESSION:  Bilateral perihilar interstitial opacities with patchy opacities in both lung bases, which may represent bronchovascular crowding and atelectasis from low lung volumes. Alternatively, this could reflect changes of developing bronchopneumonia or pulmonary edema. Electronically Signed   By: Rance Burrows M.D.   On: 06/02/2023 13:19    Procedures Procedures    Medications Ordered in ED Medications - No data to display  ED Course/ Medical Decision Making/ A&P                                 Medical Decision Making Amount and/or Complexity of Data Reviewed Labs: ordered. Radiology: ordered.   This patient presents to the ED for concern of cp, this involves an extensive number of treatment options, and is a complaint that carries with it a high risk of complications and morbidity.  The differential diagnosis includes cardiac, pulm, gi, thyroid    Co morbidities that complicate the patient evaluation  arthritis, hld, htn, obesity, dm, cva, and large goiter s/p thyroidectomy   Additional history obtained:  Additional history obtained from epic chart review External records from outside source obtained and reviewed including EMS report/husband   Lab Tests:  I Ordered, and personally interpreted labs.  The pertinent results include:  cbc nl, bmp nl other than cr sl elevated at 1.23 (stable); 1st trop elevated at 48 and 53; tsh 8.963, but that is improved from  10   Imaging Studies ordered:  I ordered imaging studies including cxr and ct chest  I independently visualized and interpreted imaging which showed  CXR: Bilateral perihilar interstitial opacities with patchy opacities in  both lung bases, which may represent bronchovascular crowding and  atelectasis from low lung volumes. Alternatively, this could reflect  changes of developing bronchopneumonia or pulmonary edema.   I agree with the radiologist interpretation   Cardiac Monitoring:  The patient was maintained on a cardiac monitor.  I personally viewed and interpreted the cardiac monitored which showed an underlying rhythm of: nsr   Medicines ordered and prescription drug management:   I have reviewed the patients home medicines and have made adjustments as needed   Test Considered:  ct  Problem List / ED Course:  HTN:  improved with rest CP:  atypical.  Troponins flat. CT chest pending.   Reevaluation:  After the interventions noted above, I reevaluated the patient and found that they have :improved   Social Determinants of Health:  Lives at home   Dispostion:  Pending at shift change        Final Clinical Impression(s) / ED Diagnoses Final diagnoses:  Stage 3a chronic kidney disease (HCC)  Nonspecific chest pain    Rx / DC Orders ED Discharge Orders     None         Sueellen Emery, MD 06/02/23 1601

## 2023-06-02 NOTE — ED Notes (Signed)
 CCMD called and is now monitoring pt

## 2023-06-02 NOTE — Discharge Instructions (Signed)
 You were seen in the emerged from for chest pain and shortness of breath Your blood work CAT scan looked okay You need to follow-up with your primary care doctor and specialist Return to the emergency room for chest pain or trouble breathing or any other concerns

## 2023-06-02 NOTE — ED Notes (Signed)
 Provider made aware of pt BP prior to discharge, told this RN that pt was still okay to be discharged. Pt reports hx of HTN.

## 2023-06-02 NOTE — ED Triage Notes (Signed)
 Pt Bib ems from doctor's office. Pt c/o cp, sob after exertion. Pt went to pcp for regular visit, reported the cp and was advised to come to ER. PMH: HTN, DM, HLD, Thyroidectomy, CVA

## 2023-06-02 NOTE — ED Provider Notes (Signed)
  Physical Exam  BP (!) 179/91   Pulse 62   Temp 97.9 F (36.6 C) (Oral)   Resp 17   Ht 5\' 5"  (1.651 m)   Wt 120 kg   LMP 06/04/2016   SpO2 99%   BMI 44.02 kg/m   Physical Exam Vitals and nursing note reviewed.  HENT:     Head: Normocephalic and atraumatic.  Eyes:     Pupils: Pupils are equal, round, and reactive to light.  Cardiovascular:     Rate and Rhythm: Normal rate and regular rhythm.  Pulmonary:     Effort: Pulmonary effort is normal.     Breath sounds: Normal breath sounds.  Abdominal:     Palpations: Abdomen is soft.     Tenderness: There is no abdominal tenderness.  Skin:    General: Skin is warm and dry.  Neurological:     Mental Status: She is alert.  Psychiatric:        Mood and Affect: Mood normal.     Procedures  Procedures  ED Course / MDM   Clinical Course as of 06/02/23 1935  Wed Jun 02, 2023  1935 No acute findings on CTA chest.  No PE or pneumonia.  Patient feels wants to go home.  She will follow-up with her PCP and specialist [MP]    Clinical Course User Index [MP] Sallyanne Creamer, DO   Medical Decision Making I, Rafael Bun DO, have assumed care of this patient from the previous provider pending CTA reevaluation disposition  Amount and/or Complexity of Data Reviewed Labs: ordered. Radiology: ordered.  Risk Prescription drug management.          Sallyanne Creamer, DO 06/02/23 1935

## 2023-06-03 LAB — PARATHYROID HORMONE, INTACT (NO CA): PTH: 26 pg/mL (ref 15–65)

## 2023-06-08 ENCOUNTER — Ambulatory Visit: Payer: Self-pay

## 2023-06-24 ENCOUNTER — Other Ambulatory Visit: Payer: Self-pay | Admitting: Family

## 2023-06-24 ENCOUNTER — Encounter: Payer: Self-pay | Admitting: Family

## 2023-06-26 ENCOUNTER — Other Ambulatory Visit: Payer: Self-pay | Admitting: Family

## 2023-06-26 ENCOUNTER — Encounter: Payer: Self-pay | Admitting: Family

## 2023-06-29 ENCOUNTER — Ambulatory Visit (INDEPENDENT_AMBULATORY_CARE_PROVIDER_SITE_OTHER): Admitting: Cardiology

## 2023-06-29 ENCOUNTER — Encounter: Payer: Self-pay | Admitting: Cardiology

## 2023-06-29 VITALS — BP 160/120 | HR 94 | Ht 65.0 in | Wt 275.2 lb

## 2023-06-29 DIAGNOSIS — B029 Zoster without complications: Secondary | ICD-10-CM | POA: Insufficient documentation

## 2023-06-29 DIAGNOSIS — I10 Essential (primary) hypertension: Secondary | ICD-10-CM

## 2023-06-29 DIAGNOSIS — E1122 Type 2 diabetes mellitus with diabetic chronic kidney disease: Secondary | ICD-10-CM

## 2023-06-29 MED ORDER — VALACYCLOVIR HCL 1 G PO TABS
1000.0000 mg | ORAL_TABLET | Freq: Three times a day (TID) | ORAL | 0 refills | Status: AC
Start: 2023-06-29 — End: 2023-07-06

## 2023-06-29 MED ORDER — LIDOCAINE VISCOUS HCL 2 % MT SOLN: 5.0000 mL | Freq: Three times a day (TID) | OROMUCOSAL | 0 refills | Status: DC

## 2023-06-29 NOTE — Progress Notes (Signed)
 Established Patient Office Visit  Subjective:  Patient ID: Claire Hansen, female    DOB: October 01, 1960  Age: 63 y.o. MRN: [id number]  Chief Complaint  Patient presents with   Acute Visit    Possible shingles    Patient in office for an acute visit, possible shingles. Patient did not take blood pressure medications this morning. Patient complaining of a rash with burning under her left arm, breast area. Patient has had shingles in the past, same side.  Patient also has a sore on her tongue on the left lateral side. Will send in valacyclovir . Magic mouthwash for the sore on her tongue.   Rash This is a new problem. The current episode started in the past 7 days. The problem has been gradually worsening since onset. The affected locations include the left axilla and back. The rash is characterized by blistering, burning and itchiness. She was exposed to nothing. Pertinent negatives include no diarrhea, joint pain or shortness of breath. Past treatments include topical steroids and anti-itch cream. The treatment provided no relief. Her past medical history is significant for varicella.    No other concerns at this time.   Past Medical History:  Diagnosis Date   Abnormal results of thyroid  function studies 03/04/2022   Abnormal results of thyroid  function studies 03/04/2022   Acute CVA (cerebrovascular accident) (HCC) 06/09/2021   Allergy    Anxiety    Asthma    pt denies having asthma   Chronic osteoarthritis    knees   Deviated septum    Diabetes mellitus type 2, uncomplicated (HCC) 03/04/2022   Diabetes mellitus without complication (HCC)    Fever postop 03/04/2022   History of asthma 08/13/2014   Hyperlipidemia    Hypertension    Irregular menstrual cycle    Leg pain, anterior, left 03/22/2020   Lymphadenopathy    Obesity    Peri-menopause    Pneumonia 2015   Post-operative state 01/10/2021   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    Past Surgical History:  Procedure  Laterality Date   BACK SURGERY  07/2020   BREAST SURGERY Right    Cyst removed   CESAREAN SECTION     x2   CHOLECYSTECTOMY     CYSTOSCOPY  01/10/2021   Procedure: CYSTOSCOPY;  Surgeon: Schermerhorn, Joselyn Nicely, MD;  Location: [facility];  Service: Gynecology;;   DILATION AND CURETTAGE OF UTERUS     Treatment of Metorrhagia and Endometrial Ablation   JOINT REPLACEMENT     PERONEAL NERVE DECOMPRESSION Left 04/29/2020   Procedure: PERONEAL NERVE DECOMPRESSION;  Surgeon: Berta Brittle, MD;  Location: [facility];  Service: Neurosurgery;  Laterality: Left;   SUPRACERVICAL ABDOMINAL HYSTERECTOMY Bilateral 01/10/2021   Procedure: HYSTERECTOMY ABDOMINAL WITH  BILATERA SALPINGO OOPHORECTOMY;  Surgeon: Schermerhorn, Joselyn Nicely, MD;  Location: [facility];  Service: Gynecology;  Laterality: Bilateral;   Thyroid   04/05/2023   TONSILLECTOMY     TOTAL HIP ARTHROPLASTY Right 11/05/2016   Procedure: TOTAL HIP ARTHROPLASTY ANTERIOR APPROACH;  Surgeon: Molli Angelucci, MD;  Location: [facility];  Service: Orthopedics;  Laterality: Right;   TOTAL HIP ARTHROPLASTY Left 07/01/2017   Procedure: TOTAL HIP ARTHROPLASTY ANTERIOR APPROACH;  Surgeon: Molli Angelucci, MD;  Location: [facility];  Service: Orthopedics;  Laterality: Left;   TOTAL HIP REVISION Left 05/03/2020   Procedure: LEFT FEMORAL COMPONENT REVISION;  Surgeon: Molli Angelucci, MD;  Location: [facility];  Service: Orthopedics;  Laterality: Left;    Social History   Socioeconomic History   Marital  status: Married    Spouse name: Not on file   Number of children: Not on file   Years of education: Not on file   Highest education level: Not on file  Occupational History   Not on file  Tobacco Use   Smoking status: Never   Smokeless tobacco: Never  Vaping Use   Vaping status: Never Used  Substance and Sexual Activity   Alcohol use: No    Alcohol/week: 0.0 standard drinks of alcohol   Drug use: No   Sexual activity: Yes    Partners: Male  Other Topics Concern    Not on file  Social History Narrative   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    1.5 yrs college    Caffeine-    Exercises 3 x weekly    Social Drivers of Health   Financial Resource Strain: Not on file  Food Insecurity: No Food Insecurity (04/14/2023)   Received from [facility]   Hunger Vital Sign    Worried About Running Out of Food in the Last Year: Never true    Ran Out of Food in the Last Year: Never true  Transportation Needs: No Transportation Needs (04/14/2023)   Received from [facility]   PRAPARE - Transportation    Lack of Transportation (Medical): No    Lack of Transportation (Non-Medical): No  Physical Activity: Not on file  Stress: Not on file  Social Connections: Not on file  Intimate Partner Violence: Patient Unable To Answer (04/14/2023)   Received from [facility]   Humiliation, Afraid, Rape, and Kick questionnaire    Fear of Current or Ex-Partner: Patient unable to answer    Emotionally Abused: Patient unable to answer    Physically Abused: Patient unable to answer    Sexually Abused: Patient unable to answer    Family History  Problem Relation Age of Onset   Asthma Son     Allergies  Allergen Reactions   Shellfish Allergy Anaphylaxis and Nausea And Vomiting    Topical betadine  is OK to use.   Statins Other (See Comments)    Muscle pain/aches, can tolerate rosuvastatin    Outpatient Medications Prior to Visit  Medication Sig   acetaminophen  (TYLENOL ) 500 MG tablet Take 1,000 mg by mouth every 8 (eight) hours as needed for mild pain or headache.   aspirin  EC 81 MG EC tablet Take 1 tablet (81 mg total) by mouth daily. Swallow whole.   busPIRone  (BUSPAR ) 5 MG tablet Take 1 tablet (5 mg total) by mouth 2 (two) times daily.   cholecalciferol (VITAMIN D3) 25 MCG (1000 UNIT) tablet Take 1,000 Units by mouth daily.   cyanocobalamin (VITAMIN B12) 100 MCG tablet Take 100 mcg by mouth daily.   FARXIGA 10 MG TABS tablet Take 10 mg by mouth  daily.   fluticasone  (FLONASE ) 50 MCG/ACT nasal spray Place 1 spray into both nostrils 2 (two) times daily.   gabapentin  (NEURONTIN ) 300 MG capsule TAKE 1 CAPSULE BY MOUTH IN THE  MORNING AND 2 CAPSULES BY MOUTH  AT BEDTIME   glucose blood (CONTOUR NEXT TEST) test strip USE AS INSTRUCTED TO CHECK BLOOD GLUCOSE UP TO TWICE DAILY   hydrALAZINE  (APRESOLINE ) 10 MG tablet TAKE 1 TABLET BY MOUTH ONCE  DAILY   insulin  lispro (HUMALOG ) 100 UNIT/ML injection Inject 0.02 mLs (2 Units total) into the skin 3 (three) times daily before meals. As needed when blood sugar is elevated.   Insulin  Pen  Needle (BD PEN NEEDLE NANO U/F) 32G X 4 MM MISC USE IN THE MORNING , AT NOON, IN THE EVENING , AND AT BEDTIME   levothyroxine (SYNTHROID) 150 MCG tablet Take 150 mcg by mouth daily before breakfast.   meloxicam  (MOBIC ) 15 MG tablet TAKE 1 TABLET BY MOUTH DAILY AS  NEEDED FOR PAIN   metFORMIN  (GLUCOPHAGE -XR) 500 MG 24 hr tablet TAKE 1 TABLET BY MOUTH TWICE  DAILY   metoprolol  succinate (TOPROL -XL) 50 MG 24 hr tablet Take 1 tablet (50 mg total) by mouth daily. Take with or immediately following a meal.   montelukast  (SINGULAIR ) 10 MG tablet TAKE 1 TABLET BY MOUTH DAILY AS  NEEDED FOR ALLERGIES   MOUNJARO 7.5 MG/0.5ML Pen Inject 7.5 mg into the skin once a week.   Multiple Vitamin (MULTIVITAMIN) tablet Take 1 tablet by mouth daily.   Olmesartan -amLODIPine -HCTZ 40-10-25 MG TABS TAKE 1 TABLET BY MOUTH ONCE  DAILY   omeprazole (PRILOSEC) 20 MG capsule Take 20 mg by mouth every morning.   rosuvastatin (CRESTOR) 10 MG tablet TAKE 1 TABLET BY MOUTH ONCE  DAILY FOR CHOLESTEROL   spironolactone  (ALDACTONE ) 25 MG tablet Take 1 tablet (25 mg total) by mouth daily.   traMADol  (ULTRAM ) 50 MG tablet TAKE 1 TABLET BY MOUTH EVERY 12 HOURS AS NEEDED   No facility-administered medications prior to visit.    Review of Systems  Constitutional: Negative.   HENT: Negative.    Eyes: Negative.   Respiratory: Negative.  Negative for  shortness of breath.   Cardiovascular: Negative.  Negative for chest pain.  Gastrointestinal: Negative.  Negative for abdominal pain, constipation and diarrhea.  Genitourinary: Negative.   Musculoskeletal:  Negative for joint pain and myalgias.  Skin:  Positive for rash.  Neurological: Negative.  Negative for dizziness and headaches.  Endo/Heme/Allergies: Negative.   All other systems reviewed and are negative.      Objective:   BP (!) 160/120   Pulse 94   Ht 5\' 5"  (1.651 m)   Wt 275 lb 3.2 oz (124.8 kg)   LMP 06/04/2016   SpO2 94%   BMI 45.80 kg/m   Vitals:   06/29/23 0913  BP: (!) 160/120  Pulse: 94  Height: 5\' 5"  (1.651 m)  Weight: 275 lb 3.2 oz (124.8 kg)  SpO2: 94%  BMI (Calculated): 45.8    Physical Exam Vitals and nursing note reviewed.  Constitutional:      Appearance: Normal appearance. She is normal weight.    HENT:     Head: Normocephalic and atraumatic.     Nose: Nose normal.     Mouth/Throat:     Mouth: Mucous membranes are moist.  Eyes:     Extraocular Movements: Extraocular movements intact.     Conjunctiva/sclera: Conjunctivae normal.     Pupils: Pupils are equal, round, and reactive to light.  Cardiovascular:     Rate and Rhythm: Normal rate and regular rhythm.     Pulses: Normal pulses.     Heart sounds: Normal heart sounds.  Pulmonary:     Effort: Pulmonary effort is normal.     Breath sounds: Normal breath sounds.  Abdominal:     General: Abdomen is flat. Bowel sounds are normal.     Palpations: Abdomen is soft.  Musculoskeletal:        General: Normal range of motion.     Cervical back: Normal range of motion.  Skin:    General: Skin is warm and dry.  Neurological:  General: No focal deficit present.     Mental Status: She is alert and oriented to person, place, and time.  Psychiatric:        Mood and Affect: Mood normal.        Behavior: Behavior normal.        Thought Content: Thought content normal.        Judgment:  Judgment normal.      No results found for any visits on 06/29/23.  Recent Results (from the past 2160 hours)  CMP14+EGFR     Status: Abnormal   Collection Time: 05/25/23 10:00 AM  Result Value Ref Range   Glucose 125 (H) 70 - 99 mg/dL   BUN 23 8 - 27 mg/dL   Creatinine, Ser 1.61 (H) 0.57 - 1.00 mg/dL   eGFR 40 (L) >09 UE/AVW/0.98   BUN/Creatinine Ratio 15 12 - 28   Sodium 142 134 - 144 mmol/L   Potassium 4.2 3.5 - 5.2 mmol/L   Chloride 103 96 - 106 mmol/L   CO2 23 20 - 29 mmol/L   Calcium  10.1 8.7 - 10.3 mg/dL   Total Protein 7.7 6.0 - 8.5 g/dL   Albumin 4.8 3.9 - 4.9 g/dL   Globulin, Total 2.9 1.5 - 4.5 g/dL   Bilirubin Total 0.3 0.0 - 1.2 mg/dL   Alkaline Phosphatase 109 44 - 121 IU/L   AST 17 0 - 40 IU/L   ALT 21 0 - 32 IU/L  Lipid panel     Status: Abnormal   Collection Time: 05/25/23 10:00 AM  Result Value Ref Range   Cholesterol, Total 229 (H) 100 - 199 mg/dL   Triglycerides 119 (H) 0 - 149 mg/dL   HDL 44 >14 mg/dL   VLDL Cholesterol Cal 30 5 - 40 mg/dL   LDL Chol Calc (NIH) 782 (H) 0 - 99 mg/dL   Chol/HDL Ratio 5.2 (H) 0.0 - 4.4 ratio    Comment:                                   T. Chol/HDL Ratio                                             Men  Women                               1/2 Avg.Risk  3.4    3.3                                   Avg.Risk  5.0    4.4                                2X Avg.Risk  9.6    7.1                                3X Avg.Risk 23.4   11.0   VITAMIN D  25 Hydroxy (Vit-D Deficiency, Fractures)     Status: None   Collection Time: 05/25/23 10:00 AM  Result Value Ref Range  Vit D, 25-Hydroxy 33.1 30.0 - 100.0 ng/mL    Comment: Vitamin D  deficiency has been defined by the Institute of Medicine and an Endocrine Society practice guideline as a level of serum 25-OH vitamin D  less than 20 ng/mL (1,2). The Endocrine Society went on to further define vitamin D  insufficiency as a level between 21 and 29 ng/mL (2). 1. IOM (Institute of  Medicine). 2010. Dietary reference    intakes for calcium  and D. [place]: The    [employer]. 2. Holick MF, Binkley Curry, Bischoff-Ferrari HA, et al.    Evaluation, treatment, and prevention of vitamin D     deficiency: an Endocrine Society clinical practice    guideline. JCEM. 2011 Jul; 96(7):1911-30.   Vitamin B12     Status: None   Collection Time: 05/25/23 10:00 AM  Result Value Ref Range   Vitamin B-12 466 232 - 1,245 pg/mL  CBC with Diff     Status: Abnormal   Collection Time: 05/25/23 10:00 AM  Result Value Ref Range   WBC 8.8 3.4 - 10.8 x10E3/uL   RBC 5.09 3.77 - 5.28 x10E6/uL   Hemoglobin 14.7 11.1 - 15.9 g/dL   Hematocrit 40.9 81.1 - 46.6 %   MCV 88 79 - 97 fL   MCH 28.9 26.6 - 33.0 pg   MCHC 33.0 31.5 - 35.7 g/dL   RDW 91.4 (H) 78.2 - 95.6 %   Platelets 299 150 - 450 x10E3/uL   Neutrophils 54 Not Estab. %   Lymphs 37 Not Estab. %   Monocytes 5 Not Estab. %   Eos 4 Not Estab. %   Basos 0 Not Estab. %   Neutrophils Absolute 4.7 1.4 - 7.0 x10E3/uL   Lymphocytes Absolute 3.3 (H) 0.7 - 3.1 x10E3/uL   Monocytes Absolute 0.4 0.1 - 0.9 x10E3/uL   EOS (ABSOLUTE) 0.3 0.0 - 0.4 x10E3/uL   Basophils Absolute 0.0 0.0 - 0.2 x10E3/uL   Immature Granulocytes 0 Not Estab. %   Immature Grans (Abs) 0.0 0.0 - 0.1 x10E3/uL  Hemoglobin A1c     Status: Abnormal   Collection Time: 05/25/23 10:00 AM  Result Value Ref Range   Hgb A1c MFr Bld 7.8 (H) 4.8 - 5.6 %    Comment:          Prediabetes: 5.7 - 6.4          Diabetes: >6.4          Glycemic control for adults with diabetes: <7.0    Est. average glucose Bld gHb Est-mCnc 177 mg/dL  TSH     Status: Abnormal   Collection Time: 05/25/23 10:00 AM  Result Value Ref Range   TSH 10.700 (H) 0.450 - 4.500 uIU/mL  Basic metabolic panel     Status: Abnormal   Collection Time: 06/02/23 11:28 AM  Result Value Ref Range   Sodium 139 135 - 145 mmol/L   Potassium 3.9 3.5 - 5.1 mmol/L   Chloride 104 98 - 111 mmol/L   CO2 25  22 - 32 mmol/L   Glucose, Bld 120 (H) 70 - 99 mg/dL    Comment: Glucose reference range applies only to samples taken after fasting for at least 8 hours.   BUN 13 8 - 23 mg/dL   Creatinine, Ser 2.13 (H) 0.44 - 1.00 mg/dL   Calcium  9.5 8.9 - 10.3 mg/dL   GFR, Estimated 50 (L) >60 mL/min    Comment: (NOTE) Calculated using the CKD-EPI Creatinine Equation (2021)    Anion  gap 10 5 - 15    Comment: Performed at [facility], [address], [place], [place] [address]  CBC     Status: None   Collection Time: 06/02/23 11:28 AM  Result Value Ref Range   WBC 9.0 4.0 - 10.5 K/uL   RBC 5.00 3.87 - 5.11 MIL/uL   Hemoglobin 14.6 12.0 - 15.0 g/dL   HCT 25.3 66.4 - 40.3 %   MCV 86.4 80.0 - 100.0 fL   MCH 29.2 26.0 - 34.0 pg   MCHC 33.8 30.0 - 36.0 g/dL   RDW 47.4 25.9 - 56.3 %   Platelets 275 150 - 400 K/uL   nRBC 0.0 0.0 - 0.2 %    Comment: Performed at [facility], [address], [place], [place] [address]  Troponin I (High Sensitivity)     Status: Abnormal   Collection Time: 06/02/23 11:28 AM  Result Value Ref Range   Troponin I (High Sensitivity) 48 (H) <18 ng/L    Comment: (NOTE) Elevated high sensitivity troponin I (hsTnI) values and significant  changes across serial measurements may suggest ACS but many other  chronic and acute conditions are known to elevate hsTnI results.  Refer to the "Links" section for chest pain algorithms and additional  guidance. Performed at [facility], [address] [place], [place] [address]   Urinalysis, Routine w reflex microscopic -Urine, Clean Catch     Status: Abnormal   Collection Time: 06/02/23 11:29 AM  Result Value Ref Range   Color, Urine YELLOW YELLOW   APPearance CLEAR CLEAR   Specific Gravity, Urine 1.010 1.005 - 1.030   pH 6.0 5.0 - 8.0   Glucose, UA NEGATIVE NEGATIVE mg/dL   Hgb urine dipstick TRACE (A) NEGATIVE   Bilirubin Urine NEGATIVE NEGATIVE   Ketones, ur NEGATIVE NEGATIVE mg/dL   Protein, ur 30 (A)  NEGATIVE mg/dL   Nitrite NEGATIVE NEGATIVE   Leukocytes,Ua NEGATIVE NEGATIVE    Comment: Performed at [facility], [address], [place], [place] [address]  Urinalysis, Microscopic (reflex)     Status: Abnormal   Collection Time: 06/02/23 11:29 AM  Result Value Ref Range   RBC / HPF 0-5 0 - 5 RBC/hpf   WBC, UA 0-5 0 - 5 WBC/hpf   Bacteria, UA RARE (A) NONE SEEN   Squamous Epithelial / HPF 0-5 0 - 5 /HPF   Mucus PRESENT     Comment: Performed at [facility], [address] [place], [place] [address]  TSH     Status: Abnormal   Collection Time: 06/02/23 11:48 AM  Result Value Ref Range   TSH 8.693 (H) 0.350 - 4.500 uIU/mL    Comment: Performed by a 3rd Generation assay with a functional sensitivity of <=0.01 uIU/mL. Performed at [facility], [address], [place], [place] [address]   Parathyroid  hormone, intact (no Ca)     Status: None   Collection Time: 06/02/23 11:48 AM  Result Value Ref Range   PTH 26 15 - 65 pg/mL    Comment: (NOTE) Performed At: [facility] [address] [place], [place] [id number] Pearlean Botts MD [facility]:[id number]   D-dimer, quantitative     Status: None   Collection Time: 06/02/23  1:02 PM  Result Value Ref Range   D-Dimer, Quant <0.27 0.00 - 0.50 ug/mL-FEU    Comment: (NOTE) At the manufacturer cut-off value of 0.5 g/mL FEU, this assay has a negative predictive  value of 95-100%.This assay is intended for use in conjunction with a clinical pretest probability (PTP) assessment model to exclude pulmonary embolism (PE) and deep venous thrombosis (DVT) in outpatients suspected of PE or DVT. Results should be correlated with clinical presentation. Performed at [facility], [address], [place], [place] [address]   Troponin I (High Sensitivity)     Status: Abnormal   Collection Time: 06/02/23  2:18 PM  Result Value Ref Range   Troponin I (High Sensitivity) 53 (H) <18 ng/L    Comment: (NOTE) Elevated high  sensitivity troponin I (hsTnI) values and significant  changes across serial measurements may suggest ACS but many other  chronic and acute conditions are known to elevate hsTnI results.  Refer to the "Links" section for chest pain algorithms and additional  guidance. Performed at [facility], [address], [place], [place] [address]       Assessment & Plan:  Valacyclovir  Magic mouthwash  Problem List Items Addressed This Visit       Nervous and Auditory   Herpes zoster without complication - Primary   Relevant Medications   valACYclovir  (VALTREX ) 1000 MG tablet    Return in about 1 week (around 07/06/2023).   Total time spent: 25 minutes  [employer], NP  06/29/2023   This document may have been prepared by Dragon Voice Recognition software and as such may include unintentional dictation errors.

## 2023-07-07 ENCOUNTER — Ambulatory Visit: Admitting: Family

## 2023-07-08 ENCOUNTER — Other Ambulatory Visit: Payer: Self-pay | Admitting: Family

## 2023-07-16 ENCOUNTER — Ambulatory Visit
Admission: RE | Admit: 2023-07-16 | Discharge: 2023-07-16 | Disposition: A | Discharge status: Home / Self Care | Source: Ambulatory Visit | Attending: Family | Admitting: Family

## 2023-07-16 DIAGNOSIS — Z1231 Encounter for screening mammogram for malignant neoplasm of breast: Secondary | ICD-10-CM | POA: Diagnosis present

## 2023-07-26 ENCOUNTER — Other Ambulatory Visit: Payer: Self-pay | Admitting: Family

## 2023-07-28 ENCOUNTER — Ambulatory Visit: Admitting: Family

## 2023-07-28 ENCOUNTER — Encounter: Payer: Self-pay | Admitting: Family

## 2023-07-28 VITALS — BP 138/85 | HR 66 | Ht 65.0 in | Wt 273.6 lb

## 2023-07-28 DIAGNOSIS — R0789 Other chest pain: Secondary | ICD-10-CM

## 2023-07-28 DIAGNOSIS — E042 Nontoxic multinodular goiter: Secondary | ICD-10-CM

## 2023-07-28 MED ORDER — VALACYCLOVIR HCL 1 G PO TABS: 1000.0000 mg | ORAL_TABLET | Freq: Three times a day (TID) | ORAL | 0 refills | Status: AC

## 2023-07-28 MED ORDER — HYDROXYZINE HCL 25 MG PO TABS: 25.0000 mg | ORAL_TABLET | Freq: Three times a day (TID) | ORAL | 0 refills | Status: AC | PRN

## 2023-07-28 MED ORDER — HYDRALAZINE HCL 25 MG PO TABS: 25.0000 mg | ORAL_TABLET | Freq: Three times a day (TID) | ORAL | 11 refills | Status: AC

## 2023-07-28 MED ORDER — TRAMADOL HCL 50 MG PO TABS
50.0000 mg | ORAL_TABLET | Freq: Two times a day (BID) | ORAL | 0 refills | Status: DC | PRN
Start: 2023-07-28 — End: 2023-08-28

## 2023-07-28 NOTE — Patient Instructions (Addendum)
 EKG in office today Setting you with a referral back to see Cardiology.   Nurse's email: [email or web address]    Please alternate doses of your levothryoxine to see if you have a resolution of your pian.

## 2023-07-28 NOTE — Progress Notes (Unsigned)
 Established Patient Office Visit  Subjective:  Patient ID: Claire Hansen, female    DOB: [birth year]  Age: 63 y.o. MRN: [id number]  Chief Complaint  Patient presents with   Follow-up   Acute Visit    Patient has rash. Poss shingles    HPI  No other concerns at this time.   Past Medical History:  Diagnosis Date   Abnormal results of thyroid  function studies 03/04/2022   Abnormal results of thyroid  function studies 03/04/2022   Acute CVA (cerebrovascular accident) (HCC) 06/09/2021   Allergy    Anxiety    Asthma    pt denies having asthma   Chronic osteoarthritis    knees   Deviated septum    Diabetes mellitus type 2, uncomplicated (HCC) 03/04/2022   Diabetes mellitus without complication (HCC)    Fever postop 03/04/2022   History of asthma 08/13/2014   Hyperlipidemia    Hypertension    Irregular menstrual cycle    Leg pain, anterior, left 03/22/2020   Lymphadenopathy    Obesity    Peri-menopause    Pneumonia 2015   Post-operative state 01/10/2021   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    Past Surgical History:  Procedure Laterality Date   BACK SURGERY  07/2020   BREAST SURGERY Right    Cyst removed   CESAREAN SECTION     x2   CHOLECYSTECTOMY     CYSTOSCOPY  01/10/2021   Procedure: CYSTOSCOPY;  Surgeon: Schermerhorn, Debby PARAS, MD;  Location: [facility];  Service: Gynecology;;   DILATION AND CURETTAGE OF UTERUS     Treatment of Metorrhagia and Endometrial Ablation   JOINT REPLACEMENT     PERONEAL NERVE DECOMPRESSION Left 04/29/2020   Procedure: PERONEAL NERVE DECOMPRESSION;  Surgeon: Bluford Standing, MD;  Location: [facility];  Service: Neurosurgery;  Laterality: Left;   SUPRACERVICAL ABDOMINAL HYSTERECTOMY Bilateral 01/10/2021   Procedure: HYSTERECTOMY ABDOMINAL WITH  BILATERA SALPINGO OOPHORECTOMY;  Surgeon: Schermerhorn, Debby PARAS, MD;  Location: [facility];  Service: Gynecology;  Laterality: Bilateral;   Thyroid   04/05/2023   TONSILLECTOMY     TOTAL HIP  ARTHROPLASTY Right 11/05/2016   Procedure: TOTAL HIP ARTHROPLASTY ANTERIOR APPROACH;  Surgeon: Kathlynn Sharper, MD;  Location: [facility];  Service: Orthopedics;  Laterality: Right;   TOTAL HIP ARTHROPLASTY Left 07/01/2017   Procedure: TOTAL HIP ARTHROPLASTY ANTERIOR APPROACH;  Surgeon: Kathlynn Sharper, MD;  Location: [facility];  Service: Orthopedics;  Laterality: Left;   TOTAL HIP REVISION Left 05/03/2020   Procedure: LEFT FEMORAL COMPONENT REVISION;  Surgeon: Kathlynn Sharper, MD;  Location: [facility];  Service: Orthopedics;  Laterality: Left;    Social History   Socioeconomic History   Marital status: Married    Spouse name: Not on file   Number of children: Not on file   Years of education: Not on file   Highest education level: Not on file  Occupational History   Not on file  Tobacco Use   Smoking status: Never   Smokeless tobacco: Never  Vaping Use   Vaping status: Never Used  Substance and Sexual Activity   Alcohol use: No    Alcohol/week: 0.0 standard drinks of alcohol   Drug use: No   Sexual activity: Yes    Partners: Male  Other Topics Concern   Not on file  Social History Narrative   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    1.5 yrs college    Caffeine-    Exercises 3 x weekly  Social Drivers of Corporate investment banker Strain: Not on file  Food Insecurity: No Food Insecurity (04/14/2023)   Received from [facility]   Hunger Vital Sign    Within the past 12 months, you worried that your food would run out before you got the money to buy more.: Never true    Within the past 12 months, the food you bought just didn't last and you didn't have money to get more.: Never true  Transportation Needs: No Transportation Needs (04/14/2023)   Received from [facility]   PRAPARE - Transportation    Lack of Transportation (Medical): No    Lack of Transportation (Non-Medical): No  Physical Activity: Not on file  Stress: Not on file  Social Connections: Not on file   Intimate Partner Violence: Patient Unable To Answer (04/14/2023)   Received from [facility]   Humiliation, Afraid, Rape, and Kick questionnaire    Within the last year, have you been afraid of your partner or ex-partner?: Patient unable to answer    Within the last year, have you been humiliated or emotionally abused in other ways by your partner or ex-partner?: Patient unable to answer    Within the last year, have you been kicked, hit, slapped, or otherwise physically hurt by your partner or ex-partner?: Patient unable to answer    Within the last year, have you been raped or forced to have any kind of sexual activity by your partner or ex-partner?: Patient unable to answer    Family History  Problem Relation Age of Onset   Asthma Son     Allergies  Allergen Reactions   Shellfish Allergy Anaphylaxis and Nausea And Vomiting    Topical betadine  is OK to use.   Statins Other (See Comments)    Muscle pain/aches, can tolerate rosuvastatin    Review of Systems  All other systems reviewed and are negative.      Objective:   BP 138/85   Pulse 66   Ht 5' 5 (1.651 m)   Wt 273 lb 9.6 oz (124.1 kg)   LMP 06/04/2016   SpO2 98%   BMI 45.53 kg/m   Vitals:   07/28/23 0923  BP: 138/85  Pulse: 66  Height: 5' 5 (1.651 m)  Weight: 273 lb 9.6 oz (124.1 kg)  SpO2: 98%  BMI (Calculated): 45.53    Physical Exam Vitals and nursing note reviewed.  Constitutional:      Appearance: Normal appearance. She is normal weight.  HENT:     Head: Normocephalic.  Eyes:     Extraocular Movements: Extraocular movements intact.     Conjunctiva/sclera: Conjunctivae normal.     Pupils: Pupils are equal, round, and reactive to light.  Cardiovascular:     Rate and Rhythm: Normal rate.  Pulmonary:     Effort: Pulmonary effort is normal.  Neurological:     General: No focal deficit present.     Mental Status: She is alert and oriented to person, place, and time. Mental status is at  baseline.  Psychiatric:        Mood and Affect: Mood normal.        Behavior: Behavior normal.        Thought Content: Thought content normal.      No results found for any visits on 07/28/23.  Recent Results (from the past 2160 hours)  CMP14+EGFR     Status: Abnormal   Collection Time: 05/25/23 10:00 AM  Result Value Ref  Range   Glucose 125 (H) 70 - 99 mg/dL   BUN 23 8 - 27 mg/dL   Creatinine, Ser 8.50 (H) 0.57 - 1.00 mg/dL   eGFR 40 (L) >40 fO/fpw/8.26   BUN/Creatinine Ratio 15 12 - 28   Sodium 142 134 - 144 mmol/L   Potassium 4.2 3.5 - 5.2 mmol/L   Chloride 103 96 - 106 mmol/L   CO2 23 20 - 29 mmol/L   Calcium  10.1 8.7 - 10.3 mg/dL   Total Protein 7.7 6.0 - 8.5 g/dL   Albumin 4.8 3.9 - 4.9 g/dL   Globulin, Total 2.9 1.5 - 4.5 g/dL   Bilirubin Total 0.3 0.0 - 1.2 mg/dL   Alkaline Phosphatase 109 44 - 121 IU/L   AST 17 0 - 40 IU/L   ALT 21 0 - 32 IU/L  Lipid panel     Status: Abnormal   Collection Time: 05/25/23 10:00 AM  Result Value Ref Range   Cholesterol, Total 229 (H) 100 - 199 mg/dL   Triglycerides 834 (H) 0 - 149 mg/dL   HDL 44 >60 mg/dL   VLDL Cholesterol Cal 30 5 - 40 mg/dL   LDL Chol Calc (NIH) 844 (H) 0 - 99 mg/dL   Chol/HDL Ratio 5.2 (H) 0.0 - 4.4 ratio    Comment:                                   T. Chol/HDL Ratio                                             Men  Women                               1/2 Avg.Risk  3.4    3.3                                   Avg.Risk  5.0    4.4                                2X Avg.Risk  9.6    7.1                                3X Avg.Risk 23.4   11.0   VITAMIN D  25 Hydroxy (Vit-D Deficiency, Fractures)     Status: None   Collection Time: 05/25/23 10:00 AM  Result Value Ref Range   Vit D, 25-Hydroxy 33.1 30.0 - 100.0 ng/mL    Comment: Vitamin D  deficiency has been defined by the Institute of Medicine and an Endocrine Society practice guideline as a level of serum 25-OH vitamin D  less than 20 ng/mL (1,2). The  Endocrine Society went on to further define vitamin D  insufficiency as a level between 21 and 29 ng/mL (2). 1. IOM (Institute of Medicine). 2010. Dietary reference    intakes for calcium  and D. [place]: The    [employer]. 2. Holick MF, Binkley [place], Bischoff-Ferrari HA, et al.    Evaluation, treatment, and prevention of vitamin D   deficiency: an Endocrine Society clinical practice    guideline. JCEM. 2011 Jul; 96(7):1911-30.   Vitamin B12     Status: None   Collection Time: 05/25/23 10:00 AM  Result Value Ref Range   Vitamin B-12 466 232 - 1,245 pg/mL  CBC with Diff     Status: Abnormal   Collection Time: 05/25/23 10:00 AM  Result Value Ref Range   WBC 8.8 3.4 - 10.8 x10E3/uL   RBC 5.09 3.77 - 5.28 x10E6/uL   Hemoglobin 14.7 11.1 - 15.9 g/dL   Hematocrit 55.3 65.9 - 46.6 %   MCV 88 79 - 97 fL   MCH 28.9 26.6 - 33.0 pg   MCHC 33.0 31.5 - 35.7 g/dL   RDW 84.4 (H) 88.2 - 84.5 %   Platelets 299 150 - 450 x10E3/uL   Neutrophils 54 Not Estab. %   Lymphs 37 Not Estab. %   Monocytes 5 Not Estab. %   Eos 4 Not Estab. %   Basos 0 Not Estab. %   Neutrophils Absolute 4.7 1.4 - 7.0 x10E3/uL   Lymphocytes Absolute 3.3 (H) 0.7 - 3.1 x10E3/uL   Monocytes Absolute 0.4 0.1 - 0.9 x10E3/uL   EOS (ABSOLUTE) 0.3 0.0 - 0.4 x10E3/uL   Basophils Absolute 0.0 0.0 - 0.2 x10E3/uL   Immature Granulocytes 0 Not Estab. %   Immature Grans (Abs) 0.0 0.0 - 0.1 x10E3/uL  Hemoglobin A1c     Status: Abnormal   Collection Time: 05/25/23 10:00 AM  Result Value Ref Range   Hgb A1c MFr Bld 7.8 (H) 4.8 - 5.6 %    Comment:          Prediabetes: 5.7 - 6.4          Diabetes: >6.4          Glycemic control for adults with diabetes: <7.0    Est. average glucose Bld gHb Est-mCnc 177 mg/dL  TSH     Status: Abnormal   Collection Time: 05/25/23 10:00 AM  Result Value Ref Range   TSH 10.700 (H) 0.450 - 4.500 uIU/mL  Basic metabolic panel     Status: Abnormal   Collection Time: 06/02/23 11:28 AM   Result Value Ref Range   Sodium 139 135 - 145 mmol/L   Potassium 3.9 3.5 - 5.1 mmol/L   Chloride 104 98 - 111 mmol/L   CO2 25 22 - 32 mmol/L   Glucose, Bld 120 (H) 70 - 99 mg/dL    Comment: Glucose reference range applies only to samples taken after fasting for at least 8 hours.   BUN 13 8 - 23 mg/dL   Creatinine, Ser 8.76 (H) 0.44 - 1.00 mg/dL   Calcium  9.5 8.9 - 10.3 mg/dL   GFR, Estimated 50 (L) >60 mL/min    Comment: (NOTE) Calculated using the CKD-EPI Creatinine Equation (2021)    Anion gap 10 5 - 15    Comment: Performed at [facility], [address] [place], [place] [address]  CBC     Status: None   Collection Time: 06/02/23 11:28 AM  Result Value Ref Range   WBC 9.0 4.0 - 10.5 K/uL   RBC 5.00 3.87 - 5.11 MIL/uL   Hemoglobin 14.6 12.0 - 15.0 g/dL   HCT 56.7 63.9 - 53.9 %   MCV 86.4 80.0 - 100.0 fL   MCH 29.2 26.0 - 34.0 pg   MCHC 33.8 30.0 - 36.0 g/dL   RDW 85.1 88.4 - 84.4 %   Platelets 275  150 - 400 K/uL   nRBC 0.0 0.0 - 0.2 %    Comment: Performed at [facility], [address] [place], [place] [address]  Troponin I (High Sensitivity)     Status: Abnormal   Collection Time: 06/02/23 11:28 AM  Result Value Ref Range   Troponin I (High Sensitivity) 48 (H) <18 ng/L    Comment: (NOTE) Elevated high sensitivity troponin I (hsTnI) values and significant  changes across serial measurements may suggest ACS but many other  chronic and acute conditions are known to elevate hsTnI results.  Refer to the Links section for chest pain algorithms and additional  guidance. Performed at [facility], [address] [place], [place] [address]   Urinalysis, Routine w reflex microscopic -Urine, Clean Catch     Status: Abnormal   Collection Time: 06/02/23 11:29 AM  Result Value Ref Range   Color, Urine YELLOW YELLOW   APPearance CLEAR CLEAR   Specific Gravity, Urine 1.010 1.005 - 1.030   pH 6.0 5.0 - 8.0   Glucose, UA NEGATIVE NEGATIVE mg/dL   Hgb  urine dipstick TRACE (A) NEGATIVE   Bilirubin Urine NEGATIVE NEGATIVE   Ketones, ur NEGATIVE NEGATIVE mg/dL   Protein, ur 30 (A) NEGATIVE mg/dL   Nitrite NEGATIVE NEGATIVE   Leukocytes,Ua NEGATIVE NEGATIVE    Comment: Performed at [facility], [address], [place], [place] [address]  Urinalysis, Microscopic (reflex)     Status: Abnormal   Collection Time: 06/02/23 11:29 AM  Result Value Ref Range   RBC / HPF 0-5 0 - 5 RBC/hpf   WBC, UA 0-5 0 - 5 WBC/hpf   Bacteria, UA RARE (A) NONE SEEN   Squamous Epithelial / HPF 0-5 0 - 5 /HPF   Mucus PRESENT     Comment: Performed at [facility], [address] [place], [place] [address]  TSH     Status: Abnormal   Collection Time: 06/02/23 11:48 AM  Result Value Ref Range   TSH 8.693 (H) 0.350 - 4.500 uIU/mL    Comment: Performed by a 3rd Generation assay with a functional sensitivity of <=0.01 uIU/mL. Performed at [facility], [address], [place], [place] [address]   Parathyroid  hormone, intact (no Ca)     Status: None   Collection Time: 06/02/23 11:48 AM  Result Value Ref Range   PTH 26 15 - 65 pg/mL    Comment: (NOTE) Performed At: [facility] [address] [place], [place] [id number] Jennette Shorter MD Ey:[id number]   D-dimer, quantitative     Status: None   Collection Time: 06/02/23  1:02 PM  Result Value Ref Range   D-Dimer, Quant <0.27 0.00 - 0.50 ug/mL-FEU    Comment: (NOTE) At the manufacturer cut-off value of 0.5 g/mL FEU, this assay has a negative predictive value of 95-100%.This assay is intended for use in conjunction with a clinical pretest probability (PTP) assessment model to exclude pulmonary embolism (PE) and deep venous thrombosis (DVT) in outpatients suspected of PE or DVT. Results should be correlated with clinical presentation. Performed at [facility], [address] [place], [place] [address]   Troponin I (High Sensitivity)     Status: Abnormal   Collection Time:  06/02/23  2:18 PM  Result Value Ref Range   Troponin I (High Sensitivity) 53 (H) <18 ng/L    Comment: (NOTE) Elevated high sensitivity troponin I (hsTnI) values and significant  changes across serial measurements  may suggest ACS but many other  chronic and acute conditions are known to elevate hsTnI results.  Refer to the Links section for chest pain algorithms and additional  guidance. Performed at [facility], [address] [place], [place] [address]        Assessment & Plan:   Assessment & Plan Other chest pain EKG in office today.   Multinodular goiter TSH, T3 and T4 checked today.     No follow-ups on file.   Total time spent: 30 minutes  ALAN [name] ARRANT, FNP  07/28/2023   This document may have been prepared by [facility] Voice Recognition software and as such may include unintentional dictation errors.

## 2023-07-28 NOTE — Assessment & Plan Note (Signed)
 TSH, T3 and T4 checked today.

## 2023-07-29 ENCOUNTER — Encounter: Payer: Self-pay | Admitting: Cardiovascular Disease

## 2023-07-29 ENCOUNTER — Ambulatory Visit: Admitting: Cardiovascular Disease

## 2023-07-29 ENCOUNTER — Encounter: Payer: Self-pay | Admitting: Family

## 2023-07-29 VITALS — BP 123/77 | HR 68 | Ht 65.0 in | Wt 273.0 lb

## 2023-07-29 DIAGNOSIS — I10 Essential (primary) hypertension: Secondary | ICD-10-CM | POA: Diagnosis not present

## 2023-07-29 DIAGNOSIS — Z6841 Body Mass Index (BMI) 40.0 and over, adult: Secondary | ICD-10-CM

## 2023-07-29 DIAGNOSIS — E1122 Type 2 diabetes mellitus with diabetic chronic kidney disease: Secondary | ICD-10-CM

## 2023-07-29 DIAGNOSIS — R0789 Other chest pain: Secondary | ICD-10-CM

## 2023-07-29 DIAGNOSIS — E782 Mixed hyperlipidemia: Secondary | ICD-10-CM

## 2023-07-29 DIAGNOSIS — R7989 Other specified abnormal findings of blood chemistry: Secondary | ICD-10-CM

## 2023-07-29 LAB — CBC WITH DIFFERENTIAL/PLATELET
Basophils Absolute: 0 10*3/uL (ref 0.0–0.2)
Basos: 0 %
EOS (ABSOLUTE): 0.4 10*3/uL (ref 0.0–0.4)
Eos: 4 %
Hematocrit: 46.3 % (ref 34.0–46.6)
Hemoglobin: 14.7 g/dL (ref 11.1–15.9)
Immature Grans (Abs): 0 10*3/uL (ref 0.0–0.1)
Immature Granulocytes: 0 %
Lymphocytes Absolute: 2.7 10*3/uL (ref 0.7–3.1)
Lymphs: 29 %
MCH: 28.8 pg (ref 26.6–33.0)
MCHC: 31.7 g/dL (ref 31.5–35.7)
MCV: 91 fL (ref 79–97)
Monocytes Absolute: 0.4 10*3/uL (ref 0.1–0.9)
Monocytes: 4 %
Neutrophils Absolute: 5.8 10*3/uL (ref 1.4–7.0)
Neutrophils: 63 %
Platelets: 299 10*3/uL (ref 150–450)
RBC: 5.1 x10E6/uL (ref 3.77–5.28)
RDW: 15.4 % (ref 11.7–15.4)
WBC: 9.3 10*3/uL (ref 3.4–10.8)

## 2023-07-29 LAB — CMP14+EGFR
ALT: 35 IU/L — ABNORMAL HIGH (ref 0–32)
AST: 23 IU/L (ref 0–40)
Albumin: 4.9 g/dL (ref 3.9–4.9)
Alkaline Phosphatase: 96 IU/L (ref 44–121)
BUN/Creatinine Ratio: 13 (ref 12–28)
BUN: 20 mg/dL (ref 8–27)
Bilirubin Total: 0.3 mg/dL (ref 0.0–1.2)
CO2: 21 mmol/L (ref 20–29)
Calcium: 9.8 mg/dL (ref 8.7–10.3)
Chloride: 100 mmol/L (ref 96–106)
Creatinine, Ser: 1.52 mg/dL — ABNORMAL HIGH (ref 0.57–1.00)
Globulin, Total: 2.8 g/dL (ref 1.5–4.5)
Glucose: 124 mg/dL — ABNORMAL HIGH (ref 70–99)
Potassium: 4.5 mmol/L (ref 3.5–5.2)
Sodium: 140 mmol/L (ref 134–144)
Total Protein: 7.7 g/dL (ref 6.0–8.5)
eGFR: 39 mL/min/{1.73_m2} — ABNORMAL LOW (ref >59)

## 2023-07-29 LAB — TROPONIN T: Troponin T (Highly Sensitive): 32 ng/L (ref 0–14)

## 2023-07-29 LAB — TSH+T4F+T3FREE
Free T4: 0.84 ng/dL (ref 0.82–1.77)
T3, Free: 1.7 pg/mL — ABNORMAL LOW (ref 2.0–4.4)
TSH: 25.1 u[IU]/mL — ABNORMAL HIGH (ref 0.450–4.500)

## 2023-07-29 NOTE — Progress Notes (Signed)
 Cardiology Office Note   Date:  07/29/2023   ID:  Claire Hansen, DOB 1960/06/30, MRN [id number]  PCP:  Orlean Alan [name], FNP  Cardiologist:  Denyse Bathe, MD      History of Present Illness: Claire Hansen is a 63 y.o. female who presents for  Chief Complaint  Patient presents with   Acute Visit    Went on levothyroxine and was experiencing chest pain as a result.     This is a 63 year old female who presents to be seen because of chest pain.  Had chest pain when taking higher dose of thyroxin.      Past Medical History:  Diagnosis Date   Abnormal results of thyroid  function studies 03/04/2022   Abnormal results of thyroid  function studies 03/04/2022   Acute CVA (cerebrovascular accident) (HCC) 06/09/2021   Allergy    Anxiety    Asthma    pt denies having asthma   Chronic osteoarthritis    knees   Deviated septum    Diabetes mellitus type 2, uncomplicated (HCC) 03/04/2022   Diabetes mellitus without complication (HCC)    Fever postop 03/04/2022   History of asthma 08/13/2014   Hyperlipidemia    Hypertension    Irregular menstrual cycle    Leg pain, anterior, left 03/22/2020   Lymphadenopathy    Obesity    Peri-menopause    Pneumonia 2015   Post-operative state 01/10/2021   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     Past Surgical History:  Procedure Laterality Date   BACK SURGERY  07/2020   BREAST SURGERY Right    Cyst removed   CESAREAN SECTION     x2   CHOLECYSTECTOMY     CYSTOSCOPY  01/10/2021   Procedure: CYSTOSCOPY;  Surgeon: Schermerhorn, Debby PARAS, MD;  Location: [facility];  Service: Gynecology;;   DILATION AND CURETTAGE OF UTERUS     Treatment of Metorrhagia and Endometrial Ablation   JOINT REPLACEMENT     PERONEAL NERVE DECOMPRESSION Left 04/29/2020   Procedure: PERONEAL NERVE DECOMPRESSION;  Surgeon: Bluford Standing, MD;  Location: [facility];  Service: Neurosurgery;  Laterality: Left;   SUPRACERVICAL ABDOMINAL HYSTERECTOMY Bilateral  01/10/2021   Procedure: HYSTERECTOMY ABDOMINAL WITH  BILATERA SALPINGO OOPHORECTOMY;  Surgeon: Schermerhorn, Debby PARAS, MD;  Location: [facility];  Service: Gynecology;  Laterality: Bilateral;   Thyroid   04/05/2023   TONSILLECTOMY     TOTAL HIP ARTHROPLASTY Right 11/05/2016   Procedure: TOTAL HIP ARTHROPLASTY ANTERIOR APPROACH;  Surgeon: Kathlynn Sharper, MD;  Location: [facility];  Service: Orthopedics;  Laterality: Right;   TOTAL HIP ARTHROPLASTY Left 07/01/2017   Procedure: TOTAL HIP ARTHROPLASTY ANTERIOR APPROACH;  Surgeon: Kathlynn Sharper, MD;  Location: [facility];  Service: Orthopedics;  Laterality: Left;   TOTAL HIP REVISION Left 05/03/2020   Procedure: LEFT FEMORAL COMPONENT REVISION;  Surgeon: Kathlynn Sharper, MD;  Location: [facility];  Service: Orthopedics;  Laterality: Left;     Current Outpatient Medications  Medication Sig Dispense Refill   acetaminophen  (TYLENOL ) 500 MG tablet Take 1,000 mg by mouth every 8 (eight) hours as needed for mild pain or headache.     aspirin  EC 81 MG EC tablet Take 1 tablet (81 mg total) by mouth daily. Swallow whole.     busPIRone  (BUSPAR ) 5 MG tablet Take 1 tablet (5 mg total) by mouth 2 (two) times daily. 60 tablet 0   cholecalciferol (VITAMIN D3) 25 MCG (1000 UNIT) tablet Take 1,000 Units by mouth daily.  cyanocobalamin (VITAMIN B12) 100 MCG tablet Take 100 mcg by mouth daily.     FARXIGA 10 MG TABS tablet Take 10 mg by mouth daily.     fluticasone  (FLONASE ) 50 MCG/ACT nasal spray Place 1 spray into both nostrils 2 (two) times daily. 16 g 3   gabapentin  (NEURONTIN ) 300 MG capsule TAKE 1 CAPSULE BY MOUTH IN THE  MORNING AND 2 CAPSULES BY MOUTH  AT BEDTIME 270 capsule 3   glucose blood (CONTOUR NEXT TEST) test strip USE AS INSTRUCTED TO CHECK BLOOD GLUCOSE UP TO TWICE DAILY 200 strip 3   hydrALAZINE  (APRESOLINE ) 25 MG tablet Take 1 tablet (25 mg total) by mouth 3 (three) times daily. 120 tablet 11   hydrOXYzine (ATARAX) 25 MG tablet Take 1 tablet (25 mg  total) by mouth 3 (three) times daily as needed. 30 tablet 0   insulin  lispro (HUMALOG ) 100 UNIT/ML injection Inject 0.02 mLs (2 Units total) into the skin 3 (three) times daily before meals. As needed when blood sugar is elevated. 10 mL 2   Insulin  Pen Needle (BD PEN NEEDLE NANO U/F) 32G X 4 MM MISC USE IN THE MORNING , AT NOON, IN THE EVENING , AND AT BEDTIME 360 each 1   magic mouthwash (lidocaine , diphenhydrAMINE , alum & mag hydroxide) suspension Swish and spit 5 mLs 3 (three) times daily. 360 mL 0   meloxicam  (MOBIC ) 15 MG tablet TAKE 1 TABLET BY MOUTH DAILY AS  NEEDED FOR PAIN 90 tablet 3   metFORMIN  (GLUCOPHAGE -XR) 500 MG 24 hr tablet TAKE 1 TABLET BY MOUTH TWICE  DAILY 180 tablet 3   metoprolol  succinate (TOPROL -XL) 50 MG 24 hr tablet Take 1 tablet (50 mg total) by mouth daily. Take with or immediately following a meal. 90 tablet 1   montelukast  (SINGULAIR ) 10 MG tablet TAKE 1 TABLET BY MOUTH DAILY AS  NEEDED FOR ALLERGIES 90 tablet 3   MOUNJARO 7.5 MG/0.5ML Pen Inject 7.5 mg into the skin once a week.     Multiple Vitamin (MULTIVITAMIN) tablet Take 1 tablet by mouth daily.     Olmesartan -amLODIPine -HCTZ 40-10-25 MG TABS TAKE 1 TABLET BY MOUTH ONCE  DAILY 90 tablet 3   omeprazole (PRILOSEC) 20 MG capsule Take 20 mg by mouth every morning.     rosuvastatin (CRESTOR) 10 MG tablet TAKE 1 TABLET BY MOUTH ONCE  DAILY FOR CHOLESTEROL 90 tablet 3   spironolactone  (ALDACTONE ) 25 MG tablet Take 1 tablet (25 mg total) by mouth daily. 90 tablet 3   traMADol  (ULTRAM ) 50 MG tablet Take 1 tablet (50 mg total) by mouth every 12 (twelve) hours as needed. 60 tablet 0   valACYclovir  (VALTREX ) 1000 MG tablet Take 1 tablet (1,000 mg total) by mouth 3 (three) times daily. 21 tablet 0   levothyroxine (SYNTHROID) 150 MCG tablet Take 150 mcg by mouth daily before breakfast.     No current facility-administered medications for this visit.    Allergies:   Shellfish allergy and Statins    Social History:    reports that she has never smoked. She has never used smokeless tobacco. She reports that she does not drink alcohol and does not use drugs.   Family History:  family history includes Asthma in her son.    ROS:     Review of Systems  Constitutional: Negative.   HENT: Negative.    Eyes: Negative.   Respiratory: Negative.    Gastrointestinal: Negative.   Genitourinary: Negative.   Musculoskeletal: Negative.   Skin: Negative.  Neurological: Negative.   Endo/Heme/Allergies: Negative.   Psychiatric/Behavioral: Negative.    All other systems reviewed and are negative.     All other systems are reviewed and negative.    PHYSICAL EXAM: VS:  BP 123/77   Pulse 68   Ht 5' 5 (1.651 m)   Wt 273 lb (123.8 kg)   LMP 06/04/2016   SpO2 97%   BMI 45.43 kg/m  , BMI Body mass index is 45.43 kg/m. Last weight:  Wt Readings from Last 3 Encounters:  07/29/23 273 lb (123.8 kg)  07/28/23 273 lb 9.6 oz (124.1 kg)  06/29/23 275 lb 3.2 oz (124.8 kg)     Physical Exam Constitutional:      Appearance: Normal appearance.  Cardiovascular:     Rate and Rhythm: Normal rate and regular rhythm.     Heart sounds: Normal heart sounds.  Pulmonary:     Effort: Pulmonary effort is normal.     Breath sounds: Normal breath sounds.  Musculoskeletal:     Right lower leg: No edema.     Left lower leg: No edema.  Neurological:     Mental Status: She is alert.       EKG: NSR LAHB, PRWP  Recent Labs: 05/25/2023: ALT 21 06/02/2023: BUN 13; Creatinine, Ser 1.23; Hemoglobin 14.6; Platelets 275; Potassium 3.9; Sodium 139; TSH 8.693    Lipid Panel    Component Value Date/Time   CHOL 229 (H) 05/25/2023 1000   TRIG 165 (H) 05/25/2023 1000   HDL 44 05/25/2023 1000   CHOLHDL 5.2 (H) 05/25/2023 1000   CHOLHDL 6.4 06/10/2021 0445   VLDL 63 (H) 06/10/2021 0445   LDLCALC 155 (H) 05/25/2023 1000      Other studies Reviewed: Additional studies/ records that were reviewed today include:  Review of  the above records demonstrates:       No data to display            ASSESSMENT AND PLAN:    ICD-10-CM   1. Other chest pain  R07.89 PCV ECHOCARDIOGRAM COMPLETE    MYOCARDIAL PERFUSION IMAGING   stress test and echo.  Patient has recurrent episodes of chest pain associated with mildly elevated troponin thus will rule out coronary artery disease.    2. Essential hypertension  I10 PCV ECHOCARDIOGRAM COMPLETE    MYOCARDIAL PERFUSION IMAGING    3. Type 2 diabetes mellitus with chronic kidney disease, without long-term current use of insulin , unspecified CKD stage (HCC)  E11.22 PCV ECHOCARDIOGRAM COMPLETE    MYOCARDIAL PERFUSION IMAGING    4. Mixed hyperlipidemia  E78.2 PCV ECHOCARDIOGRAM COMPLETE    MYOCARDIAL PERFUSION IMAGING    5. BMI 45.0-49.9, adult (HCC)  Z68.42 PCV ECHOCARDIOGRAM COMPLETE    MYOCARDIAL PERFUSION IMAGING    6. Troponin level elevated  R79.89 PCV ECHOCARDIOGRAM COMPLETE    MYOCARDIAL PERFUSION IMAGING   Had chest pains with borderline elevated troponins of 53       Problem List Items Addressed This Visit       Cardiovascular and Mediastinum   Essential hypertension   Relevant Orders   PCV ECHOCARDIOGRAM COMPLETE   MYOCARDIAL PERFUSION IMAGING     Endocrine   Type 2 diabetes mellitus with chronic kidney disease, without long-term current use of insulin , unspecified CKD stage (HCC)   Relevant Orders   PCV ECHOCARDIOGRAM COMPLETE   MYOCARDIAL PERFUSION IMAGING     Other   Hyperlipidemia   Relevant Orders   PCV ECHOCARDIOGRAM COMPLETE   MYOCARDIAL PERFUSION IMAGING  BMI 45.0-49.9, adult (HCC)   Relevant Orders   PCV ECHOCARDIOGRAM COMPLETE   MYOCARDIAL PERFUSION IMAGING   Other Visit Diagnoses       Other chest pain    -  Primary   stress test and echo.  Patient has recurrent episodes of chest pain associated with mildly elevated troponin thus will rule out coronary artery disease.   Relevant Orders   PCV ECHOCARDIOGRAM COMPLETE    MYOCARDIAL PERFUSION IMAGING     Troponin level elevated       Had chest pains with borderline elevated troponins of 53   Relevant Orders   PCV ECHOCARDIOGRAM COMPLETE   MYOCARDIAL PERFUSION IMAGING          Disposition:   Return in about 2 weeks (around 08/12/2023) for echo, and stress test and f/u.    Total time spent: 50 minutes  Signed,  Denyse Bathe, MD  07/29/2023 11:41 AM    [facility]

## 2023-07-30 ENCOUNTER — Other Ambulatory Visit: Payer: Self-pay | Admitting: Family

## 2023-08-13 ENCOUNTER — Encounter: Payer: Self-pay | Admitting: Family

## 2023-08-19 ENCOUNTER — Encounter: Payer: Self-pay | Admitting: Cardiovascular Disease

## 2023-08-19 ENCOUNTER — Ambulatory Visit

## 2023-08-19 DIAGNOSIS — I10 Essential (primary) hypertension: Secondary | ICD-10-CM

## 2023-08-19 DIAGNOSIS — E1122 Type 2 diabetes mellitus with diabetic chronic kidney disease: Secondary | ICD-10-CM

## 2023-08-19 DIAGNOSIS — R0789 Other chest pain: Secondary | ICD-10-CM | POA: Diagnosis not present

## 2023-08-19 DIAGNOSIS — E782 Mixed hyperlipidemia: Secondary | ICD-10-CM

## 2023-08-19 DIAGNOSIS — R7989 Other specified abnormal findings of blood chemistry: Secondary | ICD-10-CM

## 2023-08-19 DIAGNOSIS — Z6841 Body Mass Index (BMI) 40.0 and over, adult: Secondary | ICD-10-CM

## 2023-08-19 MED ORDER — TECHNETIUM TC 99M SESTAMIBI GENERIC - CARDIOLITE
11.1200 | Freq: Once | INTRAVENOUS | Status: AC | PRN
Start: 2023-08-19 — End: 2023-08-19
  Administered 2023-08-19: 11.12 via INTRAVENOUS

## 2023-08-19 MED ORDER — TECHNETIUM TC 99M SESTAMIBI GENERIC - CARDIOLITE
32.8000 | Freq: Once | INTRAVENOUS | Status: AC | PRN
  Administered 2023-08-19: 32.8 via INTRAVENOUS

## 2023-08-24 ENCOUNTER — Encounter: Payer: Self-pay | Admitting: Family

## 2023-08-24 ENCOUNTER — Ambulatory Visit: Admitting: Family

## 2023-08-24 VITALS — HR 65 | Ht 65.0 in | Wt 269.8 lb

## 2023-08-24 DIAGNOSIS — Z6841 Body Mass Index (BMI) 40.0 and over, adult: Secondary | ICD-10-CM

## 2023-08-24 DIAGNOSIS — E1122 Type 2 diabetes mellitus with diabetic chronic kidney disease: Secondary | ICD-10-CM

## 2023-08-24 DIAGNOSIS — I10 Essential (primary) hypertension: Secondary | ICD-10-CM | POA: Diagnosis not present

## 2023-08-24 DIAGNOSIS — R946 Abnormal results of thyroid function studies: Secondary | ICD-10-CM

## 2023-08-24 DIAGNOSIS — R0789 Other chest pain: Secondary | ICD-10-CM

## 2023-08-24 DIAGNOSIS — R7989 Other specified abnormal findings of blood chemistry: Secondary | ICD-10-CM

## 2023-08-24 DIAGNOSIS — E559 Vitamin D deficiency, unspecified: Secondary | ICD-10-CM

## 2023-08-24 DIAGNOSIS — E782 Mixed hyperlipidemia: Secondary | ICD-10-CM

## 2023-08-24 DIAGNOSIS — E042 Nontoxic multinodular goiter: Secondary | ICD-10-CM

## 2023-08-24 DIAGNOSIS — L299 Pruritus, unspecified: Secondary | ICD-10-CM | POA: Diagnosis not present

## 2023-08-24 DIAGNOSIS — R21 Rash and other nonspecific skin eruption: Secondary | ICD-10-CM

## 2023-08-24 DIAGNOSIS — E890 Postprocedural hypothyroidism: Secondary | ICD-10-CM

## 2023-08-24 DIAGNOSIS — E538 Deficiency of other specified B group vitamins: Secondary | ICD-10-CM

## 2023-08-24 NOTE — Progress Notes (Signed)
 Established Patient Office Visit  Subjective:  Patient ID: Claire Hansen, female    DOB: 31-Jul-1960  Age: 63 y.o. MRN: [id number]  Chief Complaint  Patient presents with   Follow-up    3 month follow up    Patient is here today for her 3 months follow up.  She has been feeling fairly well since last appointment.   She does have additional concerns to discuss today.  She is feeling better, but has still been having some issues with her rash and palpitations.   Labs are due today.  She needs refills.   I have reviewed her active problem list, medication list, allergies, notes from last encounter, lab results for her appointment today.    No other concerns at this time.   Past Medical History:  Diagnosis Date   Abnormal results of thyroid  function studies 03/04/2022   Abnormal results of thyroid  function studies 03/04/2022   Acute CVA (cerebrovascular accident) (HCC) 06/09/2021   Allergy    Anxiety    Asthma    pt denies having asthma   Chronic osteoarthritis    knees   Deviated septum    Diabetes mellitus type 2, uncomplicated (HCC) 03/04/2022   Diabetes mellitus without complication (HCC)    Fever postop 03/04/2022   History of asthma 08/13/2014   Hyperlipidemia    Hypertension    Irregular menstrual cycle    Leg pain, anterior, left 03/22/2020   Lymphadenopathy    Obesity    Peri-menopause    Pneumonia 2015   Post-operative state 01/10/2021   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    Past Surgical History:  Procedure Laterality Date   BACK SURGERY  07/2020   BREAST SURGERY Right    Cyst removed   CESAREAN SECTION     x2   CHOLECYSTECTOMY     CYSTOSCOPY  01/10/2021   Procedure: CYSTOSCOPY;  Surgeon: Schermerhorn, Debby PARAS, MD;  Location: [facility];  Service: Gynecology;;   DILATION AND CURETTAGE OF UTERUS     Treatment of Metorrhagia and Endometrial Ablation   JOINT REPLACEMENT     PERONEAL NERVE DECOMPRESSION Left 04/29/2020   Procedure: PERONEAL  NERVE DECOMPRESSION;  Surgeon: Bluford Standing, MD;  Location: [facility];  Service: Neurosurgery;  Laterality: Left;   SUPRACERVICAL ABDOMINAL HYSTERECTOMY Bilateral 01/10/2021   Procedure: HYSTERECTOMY ABDOMINAL WITH  BILATERA SALPINGO OOPHORECTOMY;  Surgeon: Schermerhorn, Debby PARAS, MD;  Location: [facility];  Service: Gynecology;  Laterality: Bilateral;   Thyroid   04/05/2023   TONSILLECTOMY     TOTAL HIP ARTHROPLASTY Right 11/05/2016   Procedure: TOTAL HIP ARTHROPLASTY ANTERIOR APPROACH;  Surgeon: Kathlynn Sharper, MD;  Location: [facility];  Service: Orthopedics;  Laterality: Right;   TOTAL HIP ARTHROPLASTY Left 07/01/2017   Procedure: TOTAL HIP ARTHROPLASTY ANTERIOR APPROACH;  Surgeon: Kathlynn Sharper, MD;  Location: [facility];  Service: Orthopedics;  Laterality: Left;   TOTAL HIP REVISION Left 05/03/2020   Procedure: LEFT FEMORAL COMPONENT REVISION;  Surgeon: Kathlynn Sharper, MD;  Location: [facility];  Service: Orthopedics;  Laterality: Left;    Social History   Socioeconomic History   Marital status: Married    Spouse name: Not on file   Number of children: Not on file   Years of education: Not on file   Highest education level: Not on file  Occupational History   Not on file  Tobacco Use   Smoking status: Never   Smokeless tobacco: Never  Vaping Use   Vaping status: Never Used  Substance  and Sexual Activity   Alcohol use: No    Alcohol/week: 0.0 standard drinks of alcohol   Drug use: No   Sexual activity: Yes    Partners: Male  Other Topics Concern   Not on file  Social History Narrative   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    1.5 yrs college    Caffeine-    Exercises 3 x weekly    Social Drivers of Health   Financial Resource Strain: Not on file  Food Insecurity: No Food Insecurity (04/14/2023)   Received from [facility]   Hunger Vital Sign    Within the past 12 months, you worried that your food would run out before you got the money to buy more.: Never true     Within the past 12 months, the food you bought just didn't last and you didn't have money to get more.: Never true  Transportation Needs: No Transportation Needs (04/14/2023)   Received from [facility]   PRAPARE - Transportation    Lack of Transportation (Medical): No    Lack of Transportation (Non-Medical): No  Physical Activity: Not on file  Stress: Not on file  Social Connections: Not on file  Intimate Partner Violence: Patient Unable To Answer (04/14/2023)   Received from [facility]   Humiliation, Afraid, Rape, and Kick questionnaire    Within the last year, have you been afraid of your partner or ex-partner?: Patient unable to answer    Within the last year, have you been humiliated or emotionally abused in other ways by your partner or ex-partner?: Patient unable to answer    Within the last year, have you been kicked, hit, slapped, or otherwise physically hurt by your partner or ex-partner?: Patient unable to answer    Within the last year, have you been raped or forced to have any kind of sexual activity by your partner or ex-partner?: Patient unable to answer    Family History  Problem Relation Age of Onset   Asthma Son     Allergies  Allergen Reactions   Shellfish Allergy Anaphylaxis and Nausea And Vomiting    Topical betadine  is OK to use.   Statins Other (See Comments)    Muscle pain/aches, can tolerate rosuvastatin     Review of Systems  Cardiovascular:  Positive for palpitations.  All other systems reviewed and are negative.      Objective:   Pulse 65   Ht 5' 5 (1.651 m)   Wt 269 lb 12.8 oz (122.4 kg)   LMP 06/04/2016   SpO2 95%   BMI 44.90 kg/m   Vitals:   08/24/23 0923  Pulse: 65  Height: 5' 5 (1.651 m)  Weight: 269 lb 12.8 oz (122.4 kg)  SpO2: 95%  BMI (Calculated): 44.9    Physical Exam Vitals and nursing note reviewed.  Constitutional:      Appearance: Normal appearance. She is obese.  HENT:     Head: Normocephalic.  Eyes:      Extraocular Movements: Extraocular movements intact.     Conjunctiva/sclera: Conjunctivae normal.     Pupils: Pupils are equal, round, and reactive to light.  Cardiovascular:     Rate and Rhythm: Normal rate.  Pulmonary:     Effort: Pulmonary effort is normal.  Musculoskeletal:        General: Normal range of motion.     Cervical back: Normal range of motion.  Neurological:     General: No focal  deficit present.     Mental Status: She is alert and oriented to person, place, and time. Mental status is at baseline.  Psychiatric:        Mood and Affect: Mood normal.        Behavior: Behavior normal.        Thought Content: Thought content normal.      No results found for any visits on 08/24/23.  Recent Results (from the past 2160 hours)  Basic metabolic panel     Status: Abnormal   Collection Time: 06/02/23 11:28 AM  Result Value Ref Range   Sodium 139 135 - 145 mmol/L   Potassium 3.9 3.5 - 5.1 mmol/L   Chloride 104 98 - 111 mmol/L   CO2 25 22 - 32 mmol/L   Glucose, Bld 120 (H) 70 - 99 mg/dL    Comment: Glucose reference range applies only to samples taken after fasting for at least 8 hours.   BUN 13 8 - 23 mg/dL   Creatinine, Ser 8.76 (H) 0.44 - 1.00 mg/dL   Calcium  9.5 8.9 - 10.3 mg/dL   GFR, Estimated 50 (L) >60 mL/min    Comment: (NOTE) Calculated using the CKD-EPI Creatinine Equation (2021)    Anion gap 10 5 - 15    Comment: Performed at [facility], [address] [place], [place] [address]  CBC     Status: None   Collection Time: 06/02/23 11:28 AM  Result Value Ref Range   WBC 9.0 4.0 - 10.5 K/uL   RBC 5.00 3.87 - 5.11 MIL/uL   Hemoglobin 14.6 12.0 - 15.0 g/dL   HCT 56.7 63.9 - 53.9 %   MCV 86.4 80.0 - 100.0 fL   MCH 29.2 26.0 - 34.0 pg   MCHC 33.8 30.0 - 36.0 g/dL   RDW 85.1 88.4 - 84.4 %   Platelets 275 150 - 400 K/uL   nRBC 0.0 0.0 - 0.2 %    Comment: Performed at [facility], [address] [place], [place] [address]  Troponin I (High  Sensitivity)     Status: Abnormal   Collection Time: 06/02/23 11:28 AM  Result Value Ref Range   Troponin I (High Sensitivity) 48 (H) <18 ng/L    Comment: (NOTE) Elevated high sensitivity troponin I (hsTnI) values and significant  changes across serial measurements may suggest ACS but many other  chronic and acute conditions are known to elevate hsTnI results.  Refer to the Links section for chest pain algorithms and additional  guidance. Performed at [facility], [address] [place], [place] [address]   Urinalysis, Routine w reflex microscopic -Urine, Clean Catch     Status: Abnormal   Collection Time: 06/02/23 11:29 AM  Result Value Ref Range   Color, Urine YELLOW YELLOW   APPearance CLEAR CLEAR   Specific Gravity, Urine 1.010 1.005 - 1.030   pH 6.0 5.0 - 8.0   Glucose, UA NEGATIVE NEGATIVE mg/dL   Hgb urine dipstick TRACE (A) NEGATIVE   Bilirubin Urine NEGATIVE NEGATIVE   Ketones, ur NEGATIVE NEGATIVE mg/dL   Protein, ur 30 (A) NEGATIVE mg/dL   Nitrite NEGATIVE NEGATIVE   Leukocytes,Ua NEGATIVE NEGATIVE    Comment: Performed at [facility], [address] [place], [place] [address]  Urinalysis, Microscopic (reflex)     Status: Abnormal   Collection Time: 06/02/23 11:29 AM  Result Value Ref Range   RBC / HPF 0-5 0 - 5 RBC/hpf  WBC, UA 0-5 0 - 5 WBC/hpf   Bacteria, UA RARE (A) NONE SEEN   Squamous Epithelial / HPF 0-5 0 - 5 /HPF   Mucus PRESENT     Comment: Performed at [facility], [address], [place], [place] [address]  TSH     Status: Abnormal   Collection Time: 06/02/23 11:48 AM  Result Value Ref Range   TSH 8.693 (H) 0.350 - 4.500 uIU/mL    Comment: Performed by a 3rd Generation assay with a functional sensitivity of <=0.01 uIU/mL. Performed at [facility], [address] [place], [place] [address]   Parathyroid  hormone, intact (no Ca)     Status: None   Collection Time: 06/02/23 11:48 AM  Result Value Ref Range   PTH 26 15  - 65 pg/mL    Comment: (NOTE) Performed At: [facility] [address] [place], [place] [id number] Jennette Shorter MD Ey:[id number]   D-dimer, quantitative     Status: None   Collection Time: 06/02/23  1:02 PM  Result Value Ref Range   D-Dimer, Quant <0.27 0.00 - 0.50 ug/mL-FEU    Comment: (NOTE) At the manufacturer cut-off value of 0.5 g/mL FEU, this assay has a negative predictive value of 95-100%.This assay is intended for use in conjunction with a clinical pretest probability (PTP) assessment model to exclude pulmonary embolism (PE) and deep venous thrombosis (DVT) in outpatients suspected of PE or DVT. Results should be correlated with clinical presentation. Performed at [facility], [address] [place], [place] [address]   Troponin I (High Sensitivity)     Status: Abnormal   Collection Time: 06/02/23  2:18 PM  Result Value Ref Range   Troponin I (High Sensitivity) 53 (H) <18 ng/L    Comment: (NOTE) Elevated high sensitivity troponin I (hsTnI) values and significant  changes across serial measurements may suggest ACS but many other  chronic and acute conditions are known to elevate hsTnI results.  Refer to the Links section for chest pain algorithms and additional  guidance. Performed at [facility], [address], [place], [place] [address]   [id number]     Status: Abnormal   Collection Time: 07/28/23 10:18 AM  Result Value Ref Range   TSH 25.100 (H) 0.450 - 4.500 uIU/mL   T3, Free 1.7 (L) 2.0 - 4.4 pg/mL   Free T4 0.84 0.82 - 1.77 ng/dL  [id number]     Status: Abnormal   Collection Time: 07/28/23 10:18 AM  Result Value Ref Range   Glucose 124 (H) 70 - 99 mg/dL   BUN 20 8 - 27 mg/dL   Creatinine, Ser 8.47 (H) 0.57 - 1.00 mg/dL   eGFR 39 (L) >40 fO/fpw/8.26   BUN/Creatinine Ratio 13 12 - 28   Sodium 140 134 - 144 mmol/L   Potassium 4.5 3.5 - 5.2 mmol/L   Chloride 100 96 - 106 mmol/L   CO2 21 20 - 29 mmol/L   Calcium  9.8 8.7 -  10.3 mg/dL   Total Protein 7.7 6.0 - 8.5 g/dL   Albumin 4.9 3.9 - 4.9 g/dL   Globulin, Total 2.8 1.5 - 4.5 g/dL   Bilirubin Total 0.3 0.0 - 1.2 mg/dL   Alkaline Phosphatase 96 44 - 121 IU/L   AST 23 0 - 40 IU/L   ALT 35 (H) 0 - 32 IU/L  CBC with Diff     Status: None   Collection Time: 07/28/23 10:18 AM  Result Value Ref  Range   WBC 9.3 3.4 - 10.8 x10E3/uL   RBC 5.10 3.77 - 5.28 x10E6/uL   Hemoglobin 14.7 11.1 - 15.9 g/dL   Hematocrit 53.6 65.9 - 46.6 %   MCV 91 79 - 97 fL   MCH 28.8 26.6 - 33.0 pg   MCHC 31.7 31.5 - 35.7 g/dL   RDW 84.5 88.2 - 84.5 %   Platelets 299 150 - 450 x10E3/uL   Neutrophils 63 Not Estab. %   Lymphs 29 Not Estab. %   Monocytes 4 Not Estab. %   Eos 4 Not Estab. %   Basos 0 Not Estab. %   Neutrophils Absolute 5.8 1.4 - 7.0 x10E3/uL   Lymphocytes Absolute 2.7 0.7 - 3.1 x10E3/uL   Monocytes Absolute 0.4 0.1 - 0.9 x10E3/uL   EOS (ABSOLUTE) 0.4 0.0 - 0.4 x10E3/uL   Basophils Absolute 0.0 0.0 - 0.2 x10E3/uL   Immature Granulocytes 0 Not Estab. %   Immature Grans (Abs) 0.0 0.0 - 0.1 x10E3/uL  Troponin T     Status: Abnormal   Collection Time: 07/28/23 10:18 AM  Result Value Ref Range   Troponin T (Highly Sensitive) 32 (HH) 0 - 14 ng/L    Comment: In order to distinguish acute elevations of high sensitive Troponin from other clinical conditions, the Universal Definition of myocardial infarction stresses clinical assessment and the need for serial measurements to observe a rise and/or fall above the upper limit of the reference interval.        Assessment & Plan Generalized papular rash Pruritus Suspect possibly due to contact dermatitis.   Multinodular goiter Postprocedural hypothyroidism Abnormal results of thyroid  function studies TSH still elevated.   She has been doing well with the alternated dosing.  Continue that until she sees her endocrinologist again.   Troponin level elevated Other chest pain Rechecking labs today.  Will call  with results when available.   Type 2 diabetes mellitus with chronic kidney disease, without long-term current use of insulin , unspecified CKD stage (HCC) Checking labs today. Will call pt. With results  Continue current diabetes POC, as patient has been well controlled on current regimen.  Will adjust meds if needed based on labs.   -CBC w/Diff -CMP w/eGFR -Hemoglobin A1C  BMI 45.0-49.9, adult (HCC)  Essential hypertension Blood pressure well controlled with current medications.  Continue current therapy.  Will reassess at follow up.   - CBC w/Diff - CMP w/eGFR  Mixed hyperlipidemia Checking labs today.  Continue current therapy for lipid control. Will modify as needed based on labwork results.   -CMP w/eGFR -Lipid Panel  Vitamin D  deficiency, unspecified B12 deficiency due to diet Checking labs today.  Will continue supplements as needed.   - Vitamin D  - Vitamin B12     Return in about 3 months (around 11/24/2023).   Total time spent: 20 minutes  ALAN [name] ARRANT, FNP  08/24/2023   This document may have been prepared by [facility] Voice Recognition software and as such may include unintentional dictation errors.

## 2023-08-25 LAB — VITAMIN B12: Vitamin B-12: 714 pg/mL (ref 232–1245)

## 2023-08-25 LAB — LIPID PANEL
Chol/HDL Ratio: 4.1 ratio (ref 0.0–4.4)
Cholesterol, Total: 130 mg/dL (ref 100–199)
HDL: 32 mg/dL — ABNORMAL LOW (ref >39)
LDL Chol Calc (NIH): 85 mg/dL (ref 0–99)
Triglycerides: 63 mg/dL (ref 0–149)
VLDL Cholesterol Cal: 13 mg/dL (ref 5–40)

## 2023-08-25 LAB — CBC WITH DIFFERENTIAL/PLATELET
Basophils Absolute: 0 x10E3/uL (ref 0.0–0.2)
Basos: 1 %
EOS (ABSOLUTE): 0.4 x10E3/uL (ref 0.0–0.4)
Eos: 4 %
Hematocrit: 44.3 % (ref 34.0–46.6)
Hemoglobin: 14.9 g/dL (ref 11.1–15.9)
Immature Grans (Abs): 0 x10E3/uL (ref 0.0–0.1)
Immature Granulocytes: 0 %
Lymphocytes Absolute: 2.9 x10E3/uL (ref 0.7–3.1)
Lymphs: 32 %
MCH: 29.9 pg (ref 26.6–33.0)
MCHC: 33.6 g/dL (ref 31.5–35.7)
MCV: 89 fL (ref 79–97)
Monocytes Absolute: 0.4 x10E3/uL (ref 0.1–0.9)
Monocytes: 5 %
Neutrophils Absolute: 5.1 x10E3/uL (ref 1.4–7.0)
Neutrophils: 58 %
Platelets: 302 x10E3/uL (ref 150–450)
RBC: 4.99 x10E6/uL (ref 3.77–5.28)
RDW: 15.8 % — ABNORMAL HIGH (ref 11.7–15.4)
WBC: 8.8 x10E3/uL (ref 3.4–10.8)

## 2023-08-25 LAB — CMP14+EGFR
ALT: 17 IU/L (ref 0–32)
AST: 9 IU/L (ref 0–40)
Albumin: 4.1 g/dL (ref 3.9–4.9)
Alkaline Phosphatase: 31 IU/L — ABNORMAL LOW (ref 44–121)
BUN/Creatinine Ratio: 18 (ref 12–28)
BUN: 21 mg/dL (ref 8–27)
Bilirubin Total: 0.7 mg/dL (ref 0.0–1.2)
CO2: 22 mmol/L (ref 20–29)
Calcium: 9.2 mg/dL (ref 8.7–10.3)
Chloride: 101 mmol/L (ref 96–106)
Creatinine, Ser: 1.16 mg/dL — ABNORMAL HIGH (ref 0.57–1.00)
Globulin, Total: 2.3 g/dL (ref 1.5–4.5)
Glucose: 101 mg/dL — ABNORMAL HIGH (ref 70–99)
Potassium: 4.5 mmol/L (ref 3.5–5.2)
Sodium: 137 mmol/L (ref 134–144)
Total Protein: 6.4 g/dL (ref 6.0–8.5)
eGFR: 54 mL/min/1.73 — ABNORMAL LOW (ref >59)

## 2023-08-25 LAB — VITAMIN D 25 HYDROXY (VIT D DEFICIENCY, FRACTURES): Vit D, 25-Hydroxy: 42.8 ng/mL (ref 30.0–100.0)

## 2023-08-25 LAB — HEMOGLOBIN A1C
Est. average glucose Bld gHb Est-mCnc: 163 mg/dL
Hgb A1c MFr Bld: 7.3 % — ABNORMAL HIGH (ref 4.8–5.6)

## 2023-08-26 ENCOUNTER — Ambulatory Visit: Payer: Self-pay

## 2023-08-26 LAB — ALLERGENS W/COMP RFLX AREA 2
Alternaria Alternata IgE: <0.1 kU/L
Aspergillus Fumigatus IgE: <0.1 kU/L
Bermuda Grass IgE: <0.1 kU/L
Cedar, Mountain IgE: <0.1 kU/L
Cladosporium Herbarum IgE: <0.1 kU/L
Cockroach, German IgE: 0.42 kU/L — AB
Common Silver Birch IgE: <0.1 kU/L
Cottonwood IgE: <0.1 kU/L
D Farinae IgE: 28.7 kU/L — AB
D Pteronyssinus IgE: 26 kU/L — AB
E001-IgE Cat Dander: <0.1 kU/L
E005-IgE Dog Dander: <0.1 kU/L
Elm, American IgE: <0.1 kU/L
IgE (Immunoglobulin E), Serum: 431 [IU]/mL (ref 6–495)
Johnson Grass IgE: <0.1 kU/L
Maple/Box Elder IgE: 0.22 kU/L — AB
Mouse Urine IgE: <0.1 kU/L
Oak, White IgE: <0.1 kU/L
Pecan, Hickory IgE: <0.1 kU/L
Penicillium Chrysogen IgE: <0.1 kU/L
Pigweed, Rough IgE: <0.1 kU/L
Ragweed, Short IgE: 0.86 kU/L — AB
Sheep Sorrel IgE Qn: <0.1 kU/L
Timothy Grass IgE: 0.28 kU/L — AB
White Mulberry IgE: <0.1 kU/L

## 2023-08-26 LAB — ALLERGEN FOOD PROFILE SPECIFIC IGE
Allergen Apple, IgE: <0.1 kU/L
Allergen Corn, IgE: <0.1 kU/L
Allergen Tomato, IgE: <0.1 kU/L
Chicken IgE: 0.26 kU/L — AB
Codfish IgE: <0.1 kU/L
Egg White IgE: <0.1 kU/L
Milk IgE: <0.1 kU/L
Orange: <0.1 kU/L
Peanut IgE: <0.1 kU/L
Shrimp IgE: 0.69 kU/L — AB
Soybean IgE: <0.1 kU/L
Tuna: 0.38 kU/L — AB
Wheat IgE: <0.1 kU/L

## 2023-08-26 LAB — IGG, IGA, IGM
IgA/Immunoglobulin A, Serum: 214 mg/dL (ref 87–352)
IgG (Immunoglobin G), Serum: 1245 mg/dL (ref 586–1602)
IgM (Immunoglobulin M), Srm: 54 mg/dL (ref 26–217)

## 2023-08-26 LAB — TROPONIN T: Troponin T (Highly Sensitive): 29 ng/L (ref 0–14)

## 2023-08-26 LAB — TSH+T4F+T3FREE
Free T4: 1.18 ng/dL (ref 0.82–1.77)
T3, Free: 2.3 pg/mL (ref 2.0–4.4)
TSH: 7.78 u[IU]/mL — ABNORMAL HIGH (ref 0.450–4.500)

## 2023-08-26 LAB — TRYPTASE: Tryptase: 7.2 ug/L (ref 2.2–13.2)

## 2023-08-27 ENCOUNTER — Other Ambulatory Visit: Payer: Self-pay | Admitting: Family

## 2023-09-01 ENCOUNTER — Encounter
Admission: EM | Disposition: A | Discharge status: Home / Self Care | Payer: Self-pay | Source: Home / Self Care | Attending: Internal Medicine

## 2023-09-01 ENCOUNTER — Emergency Department

## 2023-09-01 ENCOUNTER — Encounter: Payer: Self-pay | Admitting: Internal Medicine

## 2023-09-01 ENCOUNTER — Other Ambulatory Visit: Payer: Self-pay

## 2023-09-01 ENCOUNTER — Inpatient Hospital Stay
Admission: EM | Admit: 2023-09-01 | Discharge: 2023-09-02 | DRG: 281 | Disposition: A | Discharge status: Home / Self Care | Attending: Internal Medicine | Admitting: Internal Medicine

## 2023-09-01 ENCOUNTER — Inpatient Hospital Stay
Admit: 2023-09-01 | Discharge: 2023-09-01 | Disposition: A | Discharge status: Home / Self Care | Attending: Internal Medicine

## 2023-09-01 DIAGNOSIS — I2510 Atherosclerotic heart disease of native coronary artery without angina pectoris: Secondary | ICD-10-CM | POA: Diagnosis present

## 2023-09-01 DIAGNOSIS — E785 Hyperlipidemia, unspecified: Secondary | ICD-10-CM | POA: Diagnosis present

## 2023-09-01 DIAGNOSIS — I5032 Chronic diastolic (congestive) heart failure: Secondary | ICD-10-CM | POA: Diagnosis present

## 2023-09-01 DIAGNOSIS — E1140 Type 2 diabetes mellitus with diabetic neuropathy, unspecified: Secondary | ICD-10-CM | POA: Diagnosis present

## 2023-09-01 DIAGNOSIS — Z825 Family history of asthma and other chronic lower respiratory diseases: Secondary | ICD-10-CM | POA: Diagnosis not present

## 2023-09-01 DIAGNOSIS — I200 Unstable angina: Secondary | ICD-10-CM | POA: Diagnosis not present

## 2023-09-01 DIAGNOSIS — I110 Hypertensive heart disease with heart failure: Secondary | ICD-10-CM | POA: Diagnosis present

## 2023-09-01 DIAGNOSIS — Z7985 Long-term (current) use of injectable non-insulin antidiabetic drugs: Secondary | ICD-10-CM | POA: Diagnosis not present

## 2023-09-01 DIAGNOSIS — Z794 Long term (current) use of insulin: Secondary | ICD-10-CM

## 2023-09-01 DIAGNOSIS — Z79899 Other long term (current) drug therapy: Secondary | ICD-10-CM | POA: Diagnosis not present

## 2023-09-01 DIAGNOSIS — Z7984 Long term (current) use of oral hypoglycemic drugs: Secondary | ICD-10-CM | POA: Diagnosis not present

## 2023-09-01 DIAGNOSIS — E890 Postprocedural hypothyroidism: Secondary | ICD-10-CM | POA: Diagnosis present

## 2023-09-01 DIAGNOSIS — Z7982 Long term (current) use of aspirin: Secondary | ICD-10-CM | POA: Diagnosis not present

## 2023-09-01 DIAGNOSIS — Z888 Allergy status to other drugs, medicaments and biological substances status: Secondary | ICD-10-CM

## 2023-09-01 DIAGNOSIS — Z96643 Presence of artificial hip joint, bilateral: Secondary | ICD-10-CM | POA: Diagnosis present

## 2023-09-01 DIAGNOSIS — I214 Non-ST elevation (NSTEMI) myocardial infarction: Secondary | ICD-10-CM | POA: Diagnosis present

## 2023-09-01 DIAGNOSIS — Z91013 Allergy to seafood: Secondary | ICD-10-CM

## 2023-09-01 DIAGNOSIS — Z90710 Acquired absence of both cervix and uterus: Secondary | ICD-10-CM

## 2023-09-01 DIAGNOSIS — Z6841 Body Mass Index (BMI) 40.0 and over, adult: Secondary | ICD-10-CM

## 2023-09-01 DIAGNOSIS — Z79890 Hormone replacement therapy (postmenopausal): Secondary | ICD-10-CM

## 2023-09-01 DIAGNOSIS — F419 Anxiety disorder, unspecified: Secondary | ICD-10-CM | POA: Diagnosis present

## 2023-09-01 DIAGNOSIS — J45909 Unspecified asthma, uncomplicated: Secondary | ICD-10-CM | POA: Diagnosis present

## 2023-09-01 DIAGNOSIS — Z8673 Personal history of transient ischemic attack (TIA), and cerebral infarction without residual deficits: Secondary | ICD-10-CM

## 2023-09-01 HISTORY — PX: LEFT HEART CATH AND CORONARY ANGIOGRAPHY: CATH118249

## 2023-09-01 LAB — BASIC METABOLIC PANEL WITH GFR
Anion gap: 11 (ref 5–15)
BUN: 21 mg/dL (ref 8–23)
CO2: 25 mmol/L (ref 22–32)
Calcium: 10 mg/dL (ref 8.9–10.3)
Chloride: 102 mmol/L (ref 98–111)
Creatinine, Ser: 1.37 mg/dL — ABNORMAL HIGH (ref 0.44–1.00)
GFR, Estimated: 44 mL/min — ABNORMAL LOW (ref >60)
Glucose, Bld: 165 mg/dL — ABNORMAL HIGH (ref 70–99)
Potassium: 4 mmol/L (ref 3.5–5.1)
Sodium: 138 mmol/L (ref 135–145)

## 2023-09-01 LAB — PROTIME-INR
INR: 1 (ref 0.8–1.2)
Prothrombin Time: 13.9 s (ref 11.4–15.2)

## 2023-09-01 LAB — CBC
HCT: 45.4 % (ref 36.0–46.0)
Hemoglobin: 15.4 g/dL — ABNORMAL HIGH (ref 12.0–15.0)
MCH: 29.2 pg (ref 26.0–34.0)
MCHC: 33.9 g/dL (ref 30.0–36.0)
MCV: 86 fL (ref 80.0–100.0)
Platelets: 312 K/uL (ref 150–400)
RBC: 5.28 MIL/uL — ABNORMAL HIGH (ref 3.87–5.11)
RDW: 14.7 % (ref 11.5–15.5)
WBC: 9.1 K/uL (ref 4.0–10.5)
nRBC: 0 % (ref 0.0–0.2)

## 2023-09-01 LAB — HIV ANTIBODY (ROUTINE TESTING W REFLEX): HIV Screen 4th Generation wRfx: NONREACTIVE

## 2023-09-01 LAB — GLUCOSE, CAPILLARY
Glucose-Capillary: 138 mg/dL — ABNORMAL HIGH (ref 70–99)
Glucose-Capillary: 172 mg/dL — ABNORMAL HIGH (ref 70–99)

## 2023-09-01 LAB — APTT: aPTT: 31 s (ref 24–36)

## 2023-09-01 LAB — TROPONIN I (HIGH SENSITIVITY)
Troponin I (High Sensitivity): 152 ng/L (ref <18)
Troponin I (High Sensitivity): 160 ng/L (ref <18)

## 2023-09-01 SURGERY — LEFT HEART CATH AND CORONARY ANGIOGRAPHY
Anesthesia: Moderate Sedation

## 2023-09-01 MED ORDER — AMLODIPINE BESYLATE 10 MG PO TABS
10.0000 mg | ORAL_TABLET | Freq: Every day | ORAL | Status: DC
  Administered 2023-09-01 – 2023-09-02 (×2): 10 mg via ORAL
  Filled 2023-09-01: qty 1

## 2023-09-01 MED ORDER — LIDOCAINE HCL 1 % IJ SOLN
INTRAMUSCULAR | Status: AC
  Filled 2023-09-01: qty 20

## 2023-09-01 MED ORDER — HEPARIN (PORCINE) IN NACL 1000-0.9 UT/500ML-% IV SOLN
INTRAVENOUS | Status: DC | PRN
Start: 2023-09-01 — End: 2023-09-01
  Administered 2023-09-01 (×2): 500 mL

## 2023-09-01 MED ORDER — ASPIRIN 81 MG PO TBEC
81.0000 mg | DELAYED_RELEASE_TABLET | Freq: Every day | ORAL | Status: DC
  Administered 2023-09-02: 81 mg via ORAL
  Filled 2023-09-01: qty 1

## 2023-09-01 MED ORDER — HEPARIN (PORCINE) IN NACL 1000-0.9 UT/500ML-% IV SOLN
INTRAVENOUS | Status: AC
Start: 2023-09-01 — End: 2023-09-01
  Filled 2023-09-01: qty 500

## 2023-09-01 MED ORDER — ROSUVASTATIN CALCIUM 10 MG PO TABS: 10.0000 mg | ORAL_TABLET | Freq: Every day | ORAL | Status: DC

## 2023-09-01 MED ORDER — HYDROCHLOROTHIAZIDE 25 MG PO TABS
25.0000 mg | ORAL_TABLET | Freq: Every day | ORAL | Status: DC
  Administered 2023-09-01: 25 mg via ORAL
  Filled 2023-09-01 (×2): qty 1

## 2023-09-01 MED ORDER — CLOPIDOGREL BISULFATE 75 MG PO TABS
75.0000 mg | ORAL_TABLET | Freq: Every day | ORAL | Status: DC
  Administered 2023-09-01 – 2023-09-02 (×2): 75 mg via ORAL
  Filled 2023-09-01 (×2): qty 1

## 2023-09-01 MED ORDER — HYDRALAZINE HCL 20 MG/ML IJ SOLN: 10.0000 mg | INTRAMUSCULAR | Status: AC | PRN

## 2023-09-01 MED ORDER — SODIUM CHLORIDE 0.9 % WEIGHT BASED INFUSION
1.0000 mL/kg/h | INTRAVENOUS | Status: AC
  Administered 2023-09-01: 1 mL/kg/h via INTRAVENOUS

## 2023-09-01 MED ORDER — OLMESARTAN-AMLODIPINE-HCTZ 40-10-25 MG PO TABS: 1.0000 | ORAL_TABLET | Freq: Every day | ORAL | Status: DC

## 2023-09-01 MED ORDER — ONDANSETRON HCL 4 MG/2ML IJ SOLN
4.0000 mg | Freq: Four times a day (QID) | INTRAMUSCULAR | Status: DC | PRN
Start: 2023-09-01 — End: 2023-09-01

## 2023-09-01 MED ORDER — PANTOPRAZOLE SODIUM 40 MG PO TBEC
40.0000 mg | DELAYED_RELEASE_TABLET | Freq: Every day | ORAL | Status: DC
  Administered 2023-09-01 – 2023-09-02 (×2): 40 mg via ORAL
  Filled 2023-09-01 (×2): qty 1

## 2023-09-01 MED ORDER — HEPARIN (PORCINE) 25000 UT/250ML-% IV SOLN
1200.0000 [IU]/h | INTRAVENOUS | Status: DC
  Administered 2023-09-01: 1200 [IU]/h via INTRAVENOUS
  Filled 2023-09-01: qty 250

## 2023-09-01 MED ORDER — GABAPENTIN 100 MG PO CAPS
200.0000 mg | ORAL_CAPSULE | Freq: Every day | ORAL | Status: DC
  Administered 2023-09-01: 200 mg via ORAL
  Filled 2023-09-01: qty 2

## 2023-09-01 MED ORDER — ASPIRIN 81 MG PO CHEW: 81.0000 mg | CHEWABLE_TABLET | ORAL | Status: DC

## 2023-09-01 MED ORDER — ACETAMINOPHEN 325 MG PO TABS: 650.0000 mg | ORAL_TABLET | ORAL | Status: DC | PRN

## 2023-09-01 MED ORDER — FLUTICASONE PROPIONATE 50 MCG/ACT NA SUSP
1.0000 | Freq: Two times a day (BID) | NASAL | Status: DC
  Administered 2023-09-01: 1 via NASAL
  Filled 2023-09-01: qty 16

## 2023-09-01 MED ORDER — SODIUM CHLORIDE 0.9 % IV SOLN: INTRAVENOUS | Status: DC

## 2023-09-01 MED ORDER — HYDROXYZINE HCL 25 MG PO TABS
50.0000 mg | ORAL_TABLET | Freq: Three times a day (TID) | ORAL | Status: DC | PRN
  Administered 2023-09-01: 50 mg via ORAL
  Filled 2023-09-01: qty 2

## 2023-09-01 MED ORDER — SPIRONOLACTONE 25 MG PO TABS
25.0000 mg | ORAL_TABLET | Freq: Every day | ORAL | Status: DC
  Administered 2023-09-01: 25 mg via ORAL
  Filled 2023-09-01 (×2): qty 1

## 2023-09-01 MED ORDER — INSULIN ASPART 100 UNIT/ML IJ SOLN
0.0000 [IU] | Freq: Three times a day (TID) | INTRAMUSCULAR | Status: DC
  Administered 2023-09-02 (×2): 5 [IU] via SUBCUTANEOUS
  Filled 2023-09-01 (×2): qty 1

## 2023-09-01 MED ORDER — BUSPIRONE HCL 5 MG PO TABS
5.0000 mg | ORAL_TABLET | Freq: Two times a day (BID) | ORAL | Status: DC
  Administered 2023-09-01 – 2023-09-02 (×2): 5 mg via ORAL
  Filled 2023-09-01 (×2): qty 1

## 2023-09-01 MED ORDER — METOCLOPRAMIDE HCL 10 MG PO TABS
10.0000 mg | ORAL_TABLET | Freq: Once | ORAL | Status: AC
  Administered 2023-09-01: 10 mg via ORAL
  Filled 2023-09-01: qty 1

## 2023-09-01 MED ORDER — AMLODIPINE BESYLATE 5 MG PO TABS
ORAL_TABLET | ORAL | Status: AC
  Filled 2023-09-01: qty 2

## 2023-09-01 MED ORDER — HEPARIN BOLUS VIA INFUSION
4000.0000 [IU] | Freq: Once | INTRAVENOUS | Status: AC
  Administered 2023-09-01: 4000 [IU] via INTRAVENOUS
  Filled 2023-09-01: qty 4000

## 2023-09-01 MED ORDER — HEPARIN SODIUM (PORCINE) 1000 UNIT/ML IJ SOLN
INTRAMUSCULAR | Status: DC | PRN
Start: 2023-09-01 — End: 2023-09-01
  Administered 2023-09-01: 5000 [IU] via INTRAVENOUS

## 2023-09-01 MED ORDER — GABAPENTIN 100 MG PO CAPS
100.0000 mg | ORAL_CAPSULE | Freq: Every day | ORAL | Status: DC
  Administered 2023-09-01 – 2023-09-02 (×2): 100 mg via ORAL
  Filled 2023-09-01 (×2): qty 1

## 2023-09-01 MED ORDER — IRBESARTAN 150 MG PO TABS
300.0000 mg | ORAL_TABLET | Freq: Every day | ORAL | Status: DC
  Administered 2023-09-01 – 2023-09-02 (×2): 300 mg via ORAL
  Filled 2023-09-01 (×2): qty 2

## 2023-09-01 MED ORDER — TRAMADOL HCL 50 MG PO TABS
50.0000 mg | ORAL_TABLET | Freq: Two times a day (BID) | ORAL | Status: DC | PRN
  Administered 2023-09-02: 50 mg via ORAL
  Filled 2023-09-01: qty 1

## 2023-09-01 MED ORDER — FENTANYL CITRATE (PF) 100 MCG/2ML IJ SOLN
INTRAMUSCULAR | Status: AC
  Filled 2023-09-01: qty 2

## 2023-09-01 MED ORDER — HEPARIN (PORCINE) IN NACL 1000-0.9 UT/500ML-% IV SOLN
INTRAVENOUS | Status: AC
  Filled 2023-09-01: qty 1000

## 2023-09-01 MED ORDER — ACETAMINOPHEN 500 MG PO TABS: 1000.0000 mg | ORAL_TABLET | Freq: Three times a day (TID) | ORAL | Status: DC | PRN

## 2023-09-01 MED ORDER — NITROGLYCERIN 2 % TD OINT
0.5000 [in_us] | TOPICAL_OINTMENT | Freq: Four times a day (QID) | TRANSDERMAL | Status: DC
  Administered 2023-09-02: 0.5 [in_us] via TOPICAL
  Filled 2023-09-01: qty 1

## 2023-09-01 MED ORDER — VERAPAMIL HCL 2.5 MG/ML IV SOLN
INTRAVENOUS | Status: DC | PRN
  Administered 2023-09-01: 2.5 mg via INTRA_ARTERIAL

## 2023-09-01 MED ORDER — NITROGLYCERIN 0.4 MG SL SUBL
0.4000 mg | SUBLINGUAL_TABLET | SUBLINGUAL | Status: DC | PRN
  Administered 2023-09-01: 0.4 mg via SUBLINGUAL
  Filled 2023-09-01: qty 1

## 2023-09-01 MED ORDER — HYDRALAZINE HCL 25 MG PO TABS
25.0000 mg | ORAL_TABLET | Freq: Three times a day (TID) | ORAL | Status: DC
  Administered 2023-09-01 – 2023-09-02 (×2): 25 mg via ORAL
  Filled 2023-09-01 (×3): qty 1

## 2023-09-01 MED ORDER — SODIUM CHLORIDE 0.9 % IV SOLN: 250.0000 mL | INTRAVENOUS | Status: DC | PRN

## 2023-09-01 MED ORDER — LEVOTHYROXINE SODIUM 50 MCG PO TABS
125.0000 ug | ORAL_TABLET | Freq: Every day | ORAL | Status: DC
  Administered 2023-09-02: 125 ug via ORAL
  Filled 2023-09-01: qty 3

## 2023-09-01 MED ORDER — ROSUVASTATIN CALCIUM 20 MG PO TABS
40.0000 mg | ORAL_TABLET | Freq: Every day | ORAL | Status: DC
  Administered 2023-09-02: 40 mg via ORAL
  Filled 2023-09-01: qty 4
  Filled 2023-09-01: qty 2

## 2023-09-01 MED ORDER — ALUM & MAG HYDROXIDE-SIMETH 200-200-20 MG/5ML PO SUSP
30.0000 mL | Freq: Once | ORAL | Status: AC
  Administered 2023-09-01: 30 mL via ORAL
  Filled 2023-09-01: qty 30

## 2023-09-01 MED ORDER — MIDAZOLAM HCL 2 MG/2ML IJ SOLN
INTRAMUSCULAR | Status: DC | PRN
  Administered 2023-09-01: 1 mg via INTRAVENOUS

## 2023-09-01 MED ORDER — SODIUM CHLORIDE 0.9% FLUSH
3.0000 mL | Freq: Two times a day (BID) | INTRAVENOUS | Status: DC
  Administered 2023-09-02 (×2): 3 mL via INTRAVENOUS

## 2023-09-01 MED ORDER — METOPROLOL SUCCINATE ER 50 MG PO TB24
50.0000 mg | ORAL_TABLET | Freq: Every day | ORAL | Status: DC
  Administered 2023-09-02: 50 mg via ORAL
  Filled 2023-09-01: qty 1

## 2023-09-01 MED ORDER — VERAPAMIL HCL 2.5 MG/ML IV SOLN
INTRAVENOUS | Status: AC
  Filled 2023-09-01: qty 2

## 2023-09-01 MED ORDER — FENTANYL CITRATE (PF) 100 MCG/2ML IJ SOLN
INTRAMUSCULAR | Status: DC | PRN
  Administered 2023-09-01: 50 ug via INTRAVENOUS

## 2023-09-01 MED ORDER — HYDRALAZINE HCL 50 MG PO TABS
25.0000 mg | ORAL_TABLET | Freq: Three times a day (TID) | ORAL | Status: DC
  Administered 2023-09-01: 25 mg via ORAL

## 2023-09-01 MED ORDER — SODIUM CHLORIDE 0.9 % IV SOLN: INTRAVENOUS | Status: AC

## 2023-09-01 MED ORDER — ISOSORBIDE MONONITRATE ER 60 MG PO TB24
60.0000 mg | ORAL_TABLET | Freq: Every day | ORAL | Status: DC
  Administered 2023-09-01 – 2023-09-02 (×2): 60 mg via ORAL
  Filled 2023-09-01 (×2): qty 1

## 2023-09-01 MED ORDER — HYDROXYZINE HCL 25 MG PO TABS: 25.0000 mg | ORAL_TABLET | Freq: Three times a day (TID) | ORAL | Status: DC | PRN

## 2023-09-01 MED ORDER — FAMOTIDINE 20 MG PO TABS
40.0000 mg | ORAL_TABLET | Freq: Once | ORAL | Status: AC
  Administered 2023-09-01: 40 mg via ORAL
  Filled 2023-09-01: qty 2

## 2023-09-01 MED ORDER — DIPHENHYDRAMINE HCL 50 MG/ML IJ SOLN: 50.0000 mg | Freq: Once | INTRAMUSCULAR | Status: DC

## 2023-09-01 MED ORDER — IOHEXOL 300 MG/ML  SOLN
INTRAMUSCULAR | Status: DC | PRN
Start: 2023-09-01 — End: 2023-09-01
  Administered 2023-09-01: 55 mL

## 2023-09-01 MED ORDER — ASPIRIN 81 MG PO CHEW
324.0000 mg | CHEWABLE_TABLET | Freq: Once | ORAL | Status: AC
  Administered 2023-09-01: 324 mg via ORAL
  Filled 2023-09-01: qty 4

## 2023-09-01 MED ORDER — SODIUM CHLORIDE 0.9% FLUSH
3.0000 mL | INTRAVENOUS | Status: DC | PRN
Start: 2023-09-02 — End: 2023-09-02

## 2023-09-01 MED ORDER — ONDANSETRON HCL 4 MG/2ML IJ SOLN
4.0000 mg | Freq: Four times a day (QID) | INTRAMUSCULAR | Status: DC | PRN
  Administered 2023-09-02: 4 mg via INTRAVENOUS
  Filled 2023-09-01: qty 2

## 2023-09-01 MED ORDER — MIDAZOLAM HCL 2 MG/2ML IJ SOLN
INTRAMUSCULAR | Status: AC
  Filled 2023-09-01: qty 2

## 2023-09-01 MED ORDER — HEPARIN SODIUM (PORCINE) 1000 UNIT/ML IJ SOLN
INTRAMUSCULAR | Status: AC
  Filled 2023-09-01: qty 10

## 2023-09-01 SURGICAL SUPPLY — 11 items
CATH INFINITI 5 FR JL3.5 (CATHETERS) IMPLANT
CATH INFINITI JR4 5F (CATHETERS) IMPLANT
DEVICE RAD COMP TR BAND LRG (VASCULAR PRODUCTS) IMPLANT
DEVICE RAD TR BAND REGULAR (VASCULAR PRODUCTS) IMPLANT
DRAPE BRACHIAL (DRAPES) IMPLANT
GLIDESHEATH SLEND A-KIT 6F 22G (SHEATH) IMPLANT
GUIDEWIRE INQWIRE 1.5J.035X260 (WIRE) IMPLANT
PACK CARDIAC CATH (CUSTOM PROCEDURE TRAY) ×1 IMPLANT
PANNUS RETENTION SYSTEM 2 PAD (MISCELLANEOUS) IMPLANT
SET ATX-X65L (MISCELLANEOUS) IMPLANT
STATION PROTECTION PRESSURIZED (MISCELLANEOUS) IMPLANT

## 2023-09-01 NOTE — Consult Note (Signed)
 PHARMACY - ANTICOAGULATION CONSULT NOTE  Pharmacy Consult for IV heparin  initiation Indication: chest pain/ACS  Allergies  Allergen Reactions   Shellfish Allergy Anaphylaxis and Nausea And Vomiting    Topical betadine  is OK to use.   Statins Other (See Comments)    Muscle pain/aches, can tolerate rosuvastatin     Patient Measurements: Weight: 122.4 kg (269 lb 12.8 oz) IBW: 57 kg  Heparin  DW: 86.5 kg   Vital Signs: Temp: 98.3 F (36.8 C) (08/06 0756) Temp Source: Oral (08/06 0756) BP: 186/101 (08/06 0900) Pulse Rate: 66 (08/06 0900)  Labs: Recent Labs    09/01/23 0758  HGB 15.4*  HCT 45.4  PLT 312  CREATININE 1.37*  TROPONINIHS 152*    Estimated Creatinine Clearance: 56.6 mL/min (A) (by C-G formula based on SCr of 1.37 mg/dL (H)).   Medical History: Past Medical History:  Diagnosis Date   Abnormal results of thyroid  function studies 03/04/2022   Abnormal results of thyroid  function studies 03/04/2022   Acute CVA (cerebrovascular accident) (HCC) 06/09/2021   Allergy    Anxiety    Asthma    pt denies having asthma   Chronic osteoarthritis    knees   Deviated septum    Diabetes mellitus type 2, uncomplicated (HCC) 03/04/2022   Diabetes mellitus without complication (HCC)    Fever postop 03/04/2022   History of asthma 08/13/2014   Hyperlipidemia    Hypertension    Irregular menstrual cycle    Leg pain, anterior, left 03/22/2020   Lymphadenopathy    Obesity    Peri-menopause    Pneumonia 2015   Post-operative state 01/10/2021   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 Notable PMH HTN, HLD, T2DM. Troponin elevated at 152 ng/L. Renal function impaired with Scr 1.37 mg/dL (CrCl 56 mL/min) with baseline Scr of 1 - 1.2. CBC stable with hemoglobin of 15.4 g/dL and platelet count of 687 K/uL.   No documented use  of anticoagulation at home. Heparin  dosing weight of 86.5 is adjusted for patient's BMI > 40.    Goal of Therapy:  Heparin  level 0.3-0.7 units/ml Monitor platelets by anticoagulation protocol: Yes   Plan:  Give 4000 units bolus x 1 Start heparin  infusion at 1,200 units/hr Measure aPTT and PT/INR at baseline.  Monitor heparin  level in 6 hours.   Koby Hartfield [place] - PharmD Candidate  09/01/2023,9:37 AM

## 2023-09-01 NOTE — Progress Notes (Signed)
 Pt unhappy about being placed in semi private room and requesting a private room at this time. Consulting civil engineer notified

## 2023-09-01 NOTE — ED Provider Notes (Signed)
 [facility] Provider Note    Event Date/Time   First MD Initiated Contact with Patient 09/01/23 [phone]     (approximate)   History   Chief Complaint: Chest Pain   HPI  Claire Hansen is a 63 y.o. female with a history of hypertension hyperlipidemia obesity who comes ED complaining of left-sided chest pain radiating to the left arm which has been intermittent but worsening over the past month.  No specific aggravating or alleviating factors.  Feels like heaviness.  Head nuclear stress test few weeks ago, is scheduled for cardiology follow-up.  She does note that symptoms seem to start after increasing dose of Synthroid  about a month ago due to labs showing worsening hypothyroidism.      Past Medical History:  Diagnosis Date   Abnormal results of thyroid  function studies 03/04/2022   Abnormal results of thyroid  function studies 03/04/2022   Acute CVA (cerebrovascular accident) (HCC) 06/09/2021   Allergy    Anxiety    Asthma    pt denies having asthma   Chronic osteoarthritis    knees   Deviated septum    Diabetes mellitus type 2, uncomplicated (HCC) 03/04/2022   Diabetes mellitus without complication (HCC)    Fever postop 03/04/2022   History of asthma 08/13/2014   Hyperlipidemia    Hypertension    Irregular menstrual cycle    Leg pain, anterior, left 03/22/2020   Lymphadenopathy    Obesity    Peri-menopause    Pneumonia 2015   Post-operative state 01/10/2021   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    Current Outpatient Rx   Order #: [id number] Class: Historical Med   Order #: [id number] Class: OTC   Order #: [id number] Class: Normal   Order #: [id number] Class: Historical Med   Order #: [id number] Class: Historical Med   Order #: [id number] Class: Historical Med   Order #: [id number] Class: Normal   Order #: [id number] Class: Normal   Order #: [id number] Class: Normal   Order #: [id number] Class: Normal   Order #: [id number] Class: Normal   Order #:  [id number] Class: Normal   Order #: [id number] Class: Normal   Order #: [id number] Class: Historical Med   Order #: [id number] Class: Normal   Order #: [id number] Class: Normal   Order #: [id number] Class: Normal   Order #: [id number] Class: Normal   Order #: [id number] Class: Normal   Order #: [id number] Class: Historical Med   Order #: [id number] Class: Historical Med   Order #: [id number] Class: Normal   Order #: [id number] Class: Historical Med   Order #: [id number] Class: Normal   Order #: [id number] Class: Normal   Order #: [id number] Class: Normal   Order #: [id number] Class: Normal    Past Surgical History:  Procedure Laterality Date   BACK SURGERY  07/2020   BREAST SURGERY Right    Cyst removed   CESAREAN SECTION     x2   CHOLECYSTECTOMY     CYSTOSCOPY  01/10/2021   Procedure: CYSTOSCOPY;  Surgeon: Schermerhorn, Debby PARAS, MD;  Location: [facility];  Service: Gynecology;;   DILATION AND CURETTAGE OF UTERUS     Treatment of Metorrhagia and Endometrial Ablation   JOINT REPLACEMENT     PERONEAL NERVE DECOMPRESSION Left 04/29/2020   Procedure: PERONEAL NERVE DECOMPRESSION;  Surgeon: Bluford Standing, MD;  Location: [facility];  Service: Neurosurgery;  Laterality: Left;   SUPRACERVICAL ABDOMINAL HYSTERECTOMY Bilateral 01/10/2021   Procedure: HYSTERECTOMY ABDOMINAL WITH  BILATERA SALPINGO OOPHORECTOMY;  Surgeon: Schermerhorn, Debby PARAS, MD;  Location: [facility];  Service: Gynecology;  Laterality: Bilateral;   Thyroid   04/05/2023  TONSILLECTOMY     TOTAL HIP ARTHROPLASTY Right 11/05/2016   Procedure: TOTAL HIP ARTHROPLASTY ANTERIOR APPROACH;  Surgeon: Kathlynn Sharper, MD;  Location: [facility];  Service: Orthopedics;  Laterality: Right;   TOTAL HIP ARTHROPLASTY Left 07/01/2017   Procedure: TOTAL HIP ARTHROPLASTY ANTERIOR APPROACH;  Surgeon: Kathlynn Sharper, MD;  Location: [facility];  Service: Orthopedics;  Laterality: Left;   TOTAL HIP REVISION Left 05/03/2020   Procedure: LEFT FEMORAL COMPONENT REVISION;  Surgeon: Kathlynn Sharper, MD;  Location: [facility];  Service: Orthopedics;  Laterality: Left;    Physical Exam   Triage Vital Signs: ED Triage Vitals  Encounter Vitals Group     BP 09/01/23 0756 (!) 174/91     Girls Systolic BP Percentile --      Girls Diastolic BP Percentile --      Boys Systolic BP Percentile --      Boys Diastolic BP Percentile --      Pulse Rate 09/01/23 0756 74     Resp 09/01/23 0756 16     Temp 09/01/23 0756 98.3 F (36.8 C)     Temp Source 09/01/23 0756 Oral     SpO2 09/01/23 0756 97 %     Weight 09/01/23 0755 269 lb 12.8 oz (122.4 kg)     Height --      Head Circumference --      Peak Flow --      Pain Score 09/01/23 0754 9     Pain Loc --      Pain Education --      Exclude from Growth Chart --     Most recent vital signs: Vitals:   09/01/23 0845 09/01/23 0900  BP: (!) 179/82 (!) 186/101  Pulse: 70 66  Resp: 13 13  Temp:    SpO2: 100% 100%    General: Awake, no distress.  CV:  Good peripheral perfusion.  Regular rate rhythm Resp:  Normal effort.  Clear to auscultation Abd:  No distention.  Soft nontender Other:  No lower extremity edema   ED Results / Procedures / Treatments   Labs (all labs ordered are listed, but only abnormal results are displayed) Labs Reviewed  BASIC METABOLIC PANEL WITH GFR - Abnormal; Notable for the following components:      Result Value   Glucose, Bld 165 (*)    Creatinine, Ser 1.37 (*)    GFR, Estimated 44 (*)    All other components within normal limits  CBC - Abnormal; Notable for the following components:   RBC 5.28 (*)    Hemoglobin 15.4 (*)    All other components within normal limits  TROPONIN I (HIGH SENSITIVITY) - Abnormal; Notable for the following components:   Troponin I (High Sensitivity) 152 (*)    All other components within normal limits  APTT  PROTIME-INR  HEPARIN  LEVEL (UNFRACTIONATED)  TROPONIN I (HIGH SENSITIVITY)     EKG Interpreted by me Sinus rhythm rate of 75.  Left axis, normal  intervals.  Normal QRS and ST segments.  Isolated T wave inversion in aVL which is nonspecific.   RADIOLOGY Chest x-ray interpreted by me, unremarkable.  Radiology report reviewed   PROCEDURES:  .Critical Care  Performed by: Viviann Pastor, MD Authorized by: Viviann Pastor, MD   Critical care provider statement:    Critical care time (minutes):  35   Critical care time was exclusive of:  Separately billable procedures and treating other patients   Critical care was necessary to treat or prevent  imminent or life-threatening deterioration of the following conditions:  Cardiac failure   Critical care was time spent personally by me on the following activities:  Development of treatment plan with patient or surrogate, discussions with consultants, evaluation of patient's response to treatment, examination of patient, obtaining history from patient or surrogate, ordering and performing treatments and interventions, ordering and review of laboratory studies, ordering and review of radiographic studies, pulse oximetry, re-evaluation of patient's condition and review of old charts   Care discussed with: admitting provider      MEDICATIONS ORDERED IN ED: Medications  nitroGLYCERIN  (NITROSTAT ) SL tablet 0.4 mg (has no administration in time range)  heparin  bolus via infusion 4,000 Units (has no administration in time range)    Followed by  heparin  ADULT infusion 100 units/mL (25000 units/250mL) (has no administration in time range)  metoCLOPramide  (REGLAN ) tablet 10 mg (10 mg Oral Given 09/01/23 0925)  famotidine  (PEPCID ) tablet 40 mg (40 mg Oral Given 09/01/23 0925)  alum & mag hydroxide-simeth (MAALOX/MYLANTA) 200-200-20 MG/5ML suspension 30 mL (30 mLs Oral Given 09/01/23 0927)  aspirin  chewable tablet 324 mg (324 mg Oral Given 09/01/23 0940)     IMPRESSION / MDM / ASSESSMENT AND PLAN / ED COURSE  I reviewed the triage vital signs and the nursing notes.  DDx: Synthroid  side effect, GERD,  electrolyte derangement, anemia, NSTEMI, pneumothorax  Patient's presentation is most consistent with acute presentation with potential threat to life or bodily function.     Clinical Course as of 09/01/23 1032  Wed Sep 01, 2023  [id number] Patient presents with recurrent chest pain over the past month, more persistent recently.  Troponin increased to 152 today from previous level of 30-50, concerning for NSTEMI/unstable angina.  Will need to start heparin , admit to hospital for further cardiac evaluation. [PS]  1008 Case d/w hospitalist [PS]    Clinical Course User Index [PS] Viviann Pastor, MD     FINAL CLINICAL IMPRESSION(S) / ED DIAGNOSES   Final diagnoses:  Unstable angina (HCC)     Rx / DC Orders   ED Discharge Orders     None        Note:  This document was prepared using Dragon voice recognition software and may include unintentional dictation errors.   Viviann Pastor, MD 09/01/23 1034

## 2023-09-01 NOTE — ED Triage Notes (Addendum)
 Chest pain since Monday. States pain worse since Monday.  Has appointment on Friday for an Echo, had nuclear stress test on 7/24.  Had similar pains in May, seen through [facility].  Heart enzymes have been elevated since that time, but trending down.  325 mg ASA taken PTA  C?O left sided chest pain radiating to left arm.  AAOx3.  Skin warm and dry. No SOB/ DOE. NAD

## 2023-09-01 NOTE — Progress Notes (Signed)
   09/01/23 1850  Vitals  BP (!) 194/94  MAP (mmHg) 121  BP Location Right Wrist  BP Method Automatic  Patient Position (if appropriate) Lying  Pulse Rate 67  Pulse Rate Source Monitor   Per Cath lab RN pt was just given several BP meds prior to leaving cath lab. No additional PRN meds to be given at this time until meds have a chance to work. Will reassess BP again to see if additional meds needs to be given.

## 2023-09-01 NOTE — OR Nursing (Signed)
 Void and bowel movement in bathroom

## 2023-09-01 NOTE — Consult Note (Signed)
 [facility] CARDIOLOGY CONSULT NOTE       Patient ID: Claire Hansen MRN: [id number] DOB/AGE: Jun 06, 1960 63 y.o.  Admit date: 09/01/2023 Referring Physician Dr. Viviann Primary Physician Orlean Alan [name], FNP Primary Cardiologist Dr. Deretha Reason for Consultation chest pain, elevated trops  HPI: Claire Hansen is a 63 y.o. female  with a past medical history of hypertension, hyperlipidemia, type 2 diabetes on insulin , obesity, s/p thyroidectomy (on Synthroid ), hx stroke (2023) who presented to the ED on 09/01/2023 for worsening intermittent chest pain/pressure for past few months, worsening over last few days especially with minimal exertion, radiates to left arm. Patient seen by primary cardiologist, Dr. Nelida, for chest pain and at that time outpatient echocardiogram and stress test were ordered for further evaluation.  Cardiology was consulted for further evaluation.   Patient presented to the ED with chest pain that radiates to left arm, intermittent for past few months. Work up in the ED notable for 138, potassium 4.0, creatinine 1.37, GFR 44, hemoglobin 15.4, platelets 312.  Troponins elevated and trending 152 > 160. EKG with sinus rhythm, rate 75 bpm without acute ischemic changes.  Patient given aspirin  324 mg, started on heparin  infusion.  At the time of my evaluation this afternoon, patient was resting comfortably in ED stretcher with family at bedside. We discussed patients sxs in further detail. Patient states she's been having intermittent chest pressure for the past month.  Patient states more recently she has been having worsening chest pressure especially with minimally exertion.  Patient states yesterday chest pressure was 10/10 when she got up to use the bathroom.  Patient states she had a recent stress test done a few weeks ago and reports the results revealed hypokinesis of the left ventricle, however never received official results.  She had an outpatient echocardiogram  scheduled for this week, however did not want to wait due to worsening chest pressure.  Patient endorses shortness of breath associated with chest pressure.  Patient denies history of CAD, MI, AF or HF.  Patient states she used to walk with her walker daily however for the past month she has not walked due to worsening chest pressure.   Review of systems complete and found to be negative unless listed above    Past Medical History:  Diagnosis Date   Abnormal results of thyroid  function studies 03/04/2022   Abnormal results of thyroid  function studies 03/04/2022   Acute CVA (cerebrovascular accident) (HCC) 06/09/2021   Allergy    Anxiety    Asthma    pt denies having asthma   Chronic osteoarthritis    knees   Deviated septum    Diabetes mellitus type 2, uncomplicated (HCC) 03/04/2022   Diabetes mellitus without complication (HCC)    Fever postop 03/04/2022   History of asthma 08/13/2014   Hyperlipidemia    Hypertension    Irregular menstrual cycle    Leg pain, anterior, left 03/22/2020   Lymphadenopathy    Obesity    Peri-menopause    Pneumonia 2015   Post-operative state 01/10/2021   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    Past Surgical History:  Procedure Laterality Date   BACK SURGERY  07/2020   BREAST SURGERY Right    Cyst removed   CESAREAN SECTION     x2   CHOLECYSTECTOMY     CYSTOSCOPY  01/10/2021   Procedure: CYSTOSCOPY;  Surgeon: Schermerhorn, Debby PARAS, MD;  Location: [facility];  Service: Gynecology;;   DILATION AND CURETTAGE OF UTERUS  Treatment of Metorrhagia and Endometrial Ablation   JOINT REPLACEMENT     PERONEAL NERVE DECOMPRESSION Left 04/29/2020   Procedure: PERONEAL NERVE DECOMPRESSION;  Surgeon: Bluford Standing, MD;  Location: [facility];  Service: Neurosurgery;  Laterality: Left;   SUPRACERVICAL ABDOMINAL HYSTERECTOMY Bilateral 01/10/2021   Procedure: HYSTERECTOMY ABDOMINAL WITH  BILATERA SALPINGO OOPHORECTOMY;  Surgeon: Schermerhorn, Debby PARAS, MD;   Location: [facility];  Service: Gynecology;  Laterality: Bilateral;   Thyroid   04/05/2023   TONSILLECTOMY     TOTAL HIP ARTHROPLASTY Right 11/05/2016   Procedure: TOTAL HIP ARTHROPLASTY ANTERIOR APPROACH;  Surgeon: Kathlynn Sharper, MD;  Location: [facility];  Service: Orthopedics;  Laterality: Right;   TOTAL HIP ARTHROPLASTY Left 07/01/2017   Procedure: TOTAL HIP ARTHROPLASTY ANTERIOR APPROACH;  Surgeon: Kathlynn Sharper, MD;  Location: [facility];  Service: Orthopedics;  Laterality: Left;   TOTAL HIP REVISION Left 05/03/2020   Procedure: LEFT FEMORAL COMPONENT REVISION;  Surgeon: Kathlynn Sharper, MD;  Location: [facility];  Service: Orthopedics;  Laterality: Left;    (Not in a hospital admission)  Social History   Socioeconomic History   Marital status: Married    Spouse name: Not on file   Number of children: Not on file   Years of education: Not on file   Highest education level: Not on file  Occupational History   Not on file  Tobacco Use   Smoking status: Never   Smokeless tobacco: Never  Vaping Use   Vaping status: Never Used  Substance and Sexual Activity   Alcohol use: No    Alcohol/week: 0.0 standard drinks of alcohol   Drug use: No   Sexual activity: Yes    Partners: Male  Other Topics Concern   Not on file  Social History Narrative   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    1.5 yrs college    Caffeine-    Exercises 3 x weekly    Social Drivers of Health   Financial Resource Strain: Not on file  Food Insecurity: No Food Insecurity (09/01/2023)   Hunger Vital Sign    Worried About Running Out of Food in the Last Year: Never true    Ran Out of Food in the Last Year: Never true  Transportation Needs: No Transportation Needs (09/01/2023)   PRAPARE - Administrator, Civil Service (Medical): No    Lack of Transportation (Non-Medical): No  Physical Activity: Not on file  Stress: Not on file  Social Connections: Not on file  Intimate Partner Violence: Not At Risk  (09/01/2023)   Humiliation, Afraid, Rape, and Kick questionnaire    Fear of Current or Ex-Partner: No    Emotionally Abused: No    Physically Abused: No    Sexually Abused: No    Family History  Problem Relation Age of Onset   Asthma Son      Vitals:   09/01/23 0930 09/01/23 1030 09/01/23 1100 09/01/23 1200  BP: (!) 178/95 (!) 196/98 (!) 171/93   Pulse: 68 70 71   Resp: 11 13 19    Temp:      TempSrc:      SpO2: 100% 99% 93%   Weight:      Height:    5' 5 (1.651 m)    PHYSICAL EXAM General: Chronically ill-appearing female, well nourished, in no acute distress. HEENT: Normocephalic and atraumatic. Neck: No JVD.   Lungs: Normal respiratory effort on room air. Clear bilaterally to auscultation. No wheezes, crackles, rhonchi.  Heart: HRRR. Normal S1 and S2 without gallops or murmurs.  Abdomen: Non-distended appearing.  Msk: Normal strength and tone for age. Extremities: Warm and well perfused. No clubbing, cyanosis, edema.  Neuro: Alert and oriented X 3. Psych: Answers questions appropriately.   Labs: Basic Metabolic Panel: Recent Labs    09/01/23 0758  NA 138  K 4.0  CL 102  CO2 25  GLUCOSE 165*  BUN 21  CREATININE 1.37*  CALCIUM  10.0   Liver Function Tests: No results for input(s): AST, ALT, ALKPHOS, BILITOT, PROT, ALBUMIN in the last 72 hours. No results for input(s): LIPASE, AMYLASE in the last 72 hours. CBC: Recent Labs    09/01/23 0758  WBC 9.1  HGB 15.4*  HCT 45.4  MCV 86.0  PLT 312   Cardiac Enzymes: Recent Labs    09/01/23 0758 09/01/23 1011  TROPONINIHS 152* 160*   BNP: No results for input(s): BNP in the last 72 hours. D-Dimer: No results for input(s): DDIMER in the last 72 hours. Hemoglobin A1C: No results for input(s): HGBA1C in the last 72 hours. Fasting Lipid Panel: No results for input(s): CHOL, HDL, LDLCALC, TRIG, CHOLHDL, LDLDIRECT in the last 72 hours. Thyroid  Function Tests: No results for  input(s): TSH, T4TOTAL, T3FREE, THYROIDAB in the last 72 hours.  Invalid input(s): FREET3 Anemia Panel: No results for input(s): VITAMINB12, FOLATE, FERRITIN, TIBC, IRON , RETICCTPCT in the last 72 hours.   Radiology: DG Chest 2 View Result Date: 09/01/2023 CLINICAL DATA:  Left-sided chest pain. EXAM: CHEST - 2 VIEW COMPARISON:  06/02/2023. FINDINGS: Linear area of atelectasis/scarring noted overlying the left lower lung zone. Bilateral lung fields are otherwise clear. Mildly elevated right hemidiaphragm noted. Bilateral costophrenic angles are clear. Normal cardio-mediastinal silhouette. No acute osseous abnormalities. Degenerative changes of bilateral glenohumeral joints noted. The soft tissues are within normal limits. IMPRESSION: No active cardiopulmonary disease. Electronically Signed   By: Ree Molt M.D.   On: 09/01/2023 08:19    ECHO ordered.  TELEMETRY reviewed by me 09/01/2023: Sinus rhythm, rate 70s  EKG reviewed by me: sinus rhythm, rate 75 bpm without acute ischemic changes  Data reviewed by me 09/01/2023: last 24h vitals tele labs imaging I/O ED provider note, admission H&P.  Principal Problem:   NSTEMI (non-ST elevated myocardial infarction) (HCC)    ASSESSMENT AND PLAN:  Claire Hansen is a 63 y.o. female  with a past medical history of hypertension, hyperlipidemia, type 2 diabetes on insulin , obesity, s/p thyroidectomy (on Synthroid ), hx stroke (2023) who presented to the ED on 09/01/2023 for worsening intermittent chest pain/pressure for past few months, worsening over last few days especially with minimal exertion, radiates to left arm. Patient seen by primary cardiologist, Dr. Nelida, for chest pain and at that time outpatient echocardiogram and stress test were ordered for further evaluation.  Cardiology was consulted for further evaluation.   # NSTEMI # Hypertension # Hyperlipidemia Patient presents with chest pressure a/w SOB, worsens with  minimally exertion. Troponins elevated and trending 152 > 160. EKG with sinus rhythm, rate 75 bpm without acute ischemic changes. -Echo ordered. -Continue to trend troponins until peak. -Continue IV heparin  infusion. -Ordered nitropaste for chest pain/pressure. -Continue aspirin  81 mg, rosuvastatin  10 mg daily. -Continue home metoprolol  succinate 50 mg daily. -Continue home olmesartan -amlodipine -hydrochlorothiazide .  -Continue home spironolactone  25 mg daily. -Continue home hydralazine  25 mg 3 times daily. -Plan for LHC with Dr. Katina this afternoon. -Discussed the risks and benefits of proceeding with LHC for further evaluation with the patient.  She is agreeable to proceed.  NPO until LHC this afternoon (08/06 at 2:30 pm) with Dr. Katina.  Written consent will be obtained.  Further recommendations following LHC.   TIMI Risk Score for Unstable Angina or Non-ST Elevation MI:   The patient's TIMI risk score is 4, which indicates a 20% risk of all cause mortality, new or recurrent myocardial infarction or need for urgent revascularization in the next 14 days.    This patient's plan of care was discussed and created with Dr. Florencio and he is in agreement.  Signed: Dorene Comfort, PA-C  09/01/2023, 12:19 PM [facility]

## 2023-09-01 NOTE — H&P (Signed)
 History and Physical    Claire Hansen FMW:[id number] DOB: 1960/08/28 DOA: 09/01/2023  PCP: Orlean Alan [name], FNP (Confirm with patient/family/NH records and if not entered, this has to be entered at [facility] point of entry) Patient coming from: Home  I have personally briefly reviewed patient's old medical records in [facility]  Chief Complaint: Chest pain  HPI: Claire Hansen is a 63 y.o. female with medical history significant of thyroid  nodule status post total thyroidectomy on chronic Synthroid  therapy, HTN, HLD, chronic HFpEF, IDDM, diabetic neuropathy, morbid obesity, presented with worsening of chest pains.  Patient started to have intermittent chest pain since May this year.  It happens more frequent in recent months, went to see cardiology and underwent a pharmacological stress test 2 weeks ago.  She is also scheduled to have a echocardiogram this Friday.  This week however she has been having more frequent chest pains, more severe, appears to have no triggers, usually lasted about 15 to 30 minutes, no exacerbation or relieving factors.  She usually sat down to let the episode pass.  Some episodes involving chest pain radiating to left upper arms associated with nausea.  ED Course: Afebrile, nontachycardic blood pressure 180s/90, EKG showed no acute ST changes.  Blood work showed troponin 150> 160.  BUN 21 creatinine 1.3 hemoglobin 15.4.  The patient was started on heparin  drip in the ED.  Review of Systems: As per HPI otherwise 14 point review of systems negative.    Past Medical History:  Diagnosis Date   Abnormal results of thyroid  function studies 03/04/2022   Abnormal results of thyroid  function studies 03/04/2022   Acute CVA (cerebrovascular accident) (HCC) 06/09/2021   Allergy    Anxiety    Asthma    pt denies having asthma   Chronic osteoarthritis    knees   Deviated septum    Diabetes mellitus type 2, uncomplicated (HCC) 03/04/2022   Diabetes mellitus  without complication (HCC)    Fever postop 03/04/2022   History of asthma 08/13/2014   Hyperlipidemia    Hypertension    Irregular menstrual cycle    Leg pain, anterior, left 03/22/2020   Lymphadenopathy    Obesity    Peri-menopause    Pneumonia 2015   Post-operative state 01/10/2021   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    Past Surgical History:  Procedure Laterality Date   BACK SURGERY  07/2020   BREAST SURGERY Right    Cyst removed   CESAREAN SECTION     x2   CHOLECYSTECTOMY     CYSTOSCOPY  01/10/2021   Procedure: CYSTOSCOPY;  Surgeon: Schermerhorn, Debby PARAS, MD;  Location: [facility] ORS;  Service: Gynecology;;   DILATION AND CURETTAGE OF UTERUS     Treatment of Metorrhagia and Endometrial Ablation   JOINT REPLACEMENT     PERONEAL NERVE DECOMPRESSION Left 04/29/2020   Procedure: PERONEAL NERVE DECOMPRESSION;  Surgeon: Bluford Standing, MD;  Location: [facility] ORS;  Service: Neurosurgery;  Laterality: Left;   SUPRACERVICAL ABDOMINAL HYSTERECTOMY Bilateral 01/10/2021   Procedure: HYSTERECTOMY ABDOMINAL WITH  BILATERA SALPINGO OOPHORECTOMY;  Surgeon: Schermerhorn, Debby PARAS, MD;  Location: [facility] ORS;  Service: Gynecology;  Laterality: Bilateral;   Thyroid   04/05/2023   TONSILLECTOMY     TOTAL HIP ARTHROPLASTY Right 11/05/2016   Procedure: TOTAL HIP ARTHROPLASTY ANTERIOR APPROACH;  Surgeon: Kathlynn Sharper, MD;  Location: [facility] OR[facility];  Service: Orthopedics;  Laterality: Right;   TOTAL HIP ARTHROPLASTY Left 07/01/2017   Procedure: TOTAL HIP ARTHROPLASTY ANTERIOR APPROACH;  Surgeon: Kathlynn Sharper, MD;  Location: [facility] OR[facility];  Service: Orthopedics;  Laterality: Left;   TOTAL HIP REVISION Left 05/03/2020   Procedure: LEFT FEMORAL COMPONENT REVISION;  Surgeon: Kathlynn Sharper, MD;  Location: [facility] OR[facility];  Service: Orthopedics;  Laterality: Left;     reports that she has never smoked. She has never used smokeless tobacco. She reports that she does not drink alcohol and does not use drugs.  Allergies   Allergen Reactions   Shellfish Allergy Anaphylaxis and Nausea And Vomiting    Topical betadine  is OK to use.   Statins Other (See Comments)    Muscle pain/aches, can tolerate rosuvastatin     Family History  Problem Relation Age of Onset   Asthma Son      Prior to Admission medications   Medication Sig Start Date End Date Taking? Authorizing Provider  acetaminophen  (TYLENOL ) 500 MG tablet Take 1,000 mg by mouth every 8 (eight) hours as needed for mild pain or headache. 05/29/13   [provider]  aspirin  EC 81 MG EC tablet Take 1 tablet (81 mg total) by mouth daily. Swallow whole. 06/11/21   Awanda [place], MD  busPIRone  (BUSPAR ) 5 MG tablet Take 1 tablet (5 mg total) by mouth 2 (two) times daily. 01/29/23   Orlean Alan HERO, FNP  cholecalciferol (VITAMIN D3) 25 MCG (1000 UNIT) tablet Take 1,000 Units by mouth daily.    [provider]  cyanocobalamin (VITAMIN B12) 100 MCG tablet Take 100 mcg by mouth daily.    [provider]  FARXIGA 10 MG TABS tablet Take 10 mg by mouth daily. 05/14/23   [provider]  fluticasone  (FLONASE ) 50 MCG/ACT nasal spray Place 1 spray into both nostrils 2 (two) times daily. 01/29/23   Orlean Alan HERO, FNP  gabapentin  (NEURONTIN ) 300 MG capsule TAKE 1 CAPSULE BY MOUTH IN THE  MORNING AND 2 CAPSULES BY MOUTH  AT BEDTIME 07/08/23   Orlean Alan HERO, FNP  glucose blood (CONTOUR NEXT TEST) test strip USE AS INSTRUCTED TO CHECK BLOOD GLUCOSE UP TO TWICE DAILY 03/15/23   Orlean Alan HERO, FNP  hydrALAZINE  (APRESOLINE ) 25 MG tablet Take 1 tablet (25 mg total) by mouth 3 (three) times daily. 07/28/23 07/27/24  Orlean Alan HERO, FNP  hydrOXYzine  (ATARAX ) 25 MG tablet Take 1 tablet (25 mg total) by mouth 3 (three) times daily as needed. 07/28/23   Orlean Alan HERO, FNP  insulin  lispro (HUMALOG ) 100 UNIT/ML injection Inject 0.02 mLs (2 Units total) into the skin 3 (three) times daily before meals. As needed when blood sugar is elevated. 10/06/22    Orlean Alan HERO, FNP  Insulin  Pen Needle (BD PEN NEEDLE NANO U/F) 32G X 4 MM MISC USE IN THE MORNING , AT NOON, IN THE EVENING , AND AT BEDTIME 04/30/23   Orlean Alan HERO, FNP  levothyroxine  (SYNTHROID ) 150 MCG tablet Take 150 mcg by mouth daily before breakfast.    [provider]  magic mouthwash (lidocaine , diphenhydrAMINE , alum & mag hydroxide) suspension Swish and spit 5 mLs 3 (three) times daily. Patient not taking: Reported on 08/24/2023 06/29/23   [place], Hospital doctor, NP  meloxicam  (MOBIC ) 15 MG tablet TAKE 1 TABLET BY MOUTH DAILY AS  NEEDED FOR PAIN 06/28/23   Orlean Alan HERO, FNP  metFORMIN  (GLUCOPHAGE -XR) 500 MG 24 hr tablet TAKE 1 TABLET BY MOUTH TWICE  DAILY 04/05/23   Orlean Alan HERO, FNP  metoprolol  succinate (TOPROL -XL) 50 MG 24 hr tablet TAKE 1 TABLET BY MOUTH DAILY  WITH OR  IMMEDIATELY FOLLOWING A  MEAL 07/30/23   Orlean Alan HERO, FNP  montelukast  (SINGULAIR ) 10 MG tablet TAKE 1 TABLET BY MOUTH DAILY AS  NEEDED FOR ALLERGIES 05/10/23   Orlean Alan HERO, FNP  MOUNJARO 7.5 MG/0.5ML Pen Inject 7.5 mg into the skin once a week.    [provider]  Multiple Vitamin (MULTIVITAMIN) tablet Take 1 tablet by mouth daily.    [provider]  Olmesartan -amLODIPine -HCTZ 40-10-25 MG TABS TAKE 1 TABLET BY MOUTH ONCE  DAILY 02/09/23   Orlean Alan HERO, FNP  omeprazole (PRILOSEC) 20 MG capsule Take 20 mg by mouth every morning. 08/24/22   [provider]  rosuvastatin  (CRESTOR ) 10 MG tablet TAKE 1 TABLET BY MOUTH ONCE  DAILY FOR CHOLESTEROL 05/10/23   Orlean Alan HERO, FNP  spironolactone  (ALDACTONE ) 25 MG tablet Take 1 tablet (25 mg total) by mouth daily. 02/27/23   Orlean Alan HERO, FNP  traMADol  (ULTRAM ) 50 MG tablet TAKE 1 TABLET BY MOUTH EVERY 12 HOURS AS NEEDED 08/28/23   Orlean Alan HERO, FNP  valACYclovir  (VALTREX ) 1000 MG tablet Take 1 tablet (1,000 mg total) by mouth 3 (three) times daily. 07/28/23   Orlean Alan HERO, FNP    Physical Exam: Vitals:    09/01/23 0900 09/01/23 0930 09/01/23 1030 09/01/23 1100  BP: (!) 186/101 (!) 178/95 (!) 196/98 (!) 171/93  Pulse: 66 68 70 71  Resp: 13 11 13 19   Temp:      TempSrc:      SpO2: 100% 100% 99% 93%  Weight:        Constitutional: NAD, calm, comfortable Vitals:   09/01/23 0900 09/01/23 0930 09/01/23 1030 09/01/23 1100  BP: (!) 186/101 (!) 178/95 (!) 196/98 (!) 171/93  Pulse: 66 68 70 71  Resp: 13 11 13 19   Temp:      TempSrc:      SpO2: 100% 100% 99% 93%  Weight:       Eyes: PERRL, lids and conjunctivae normal ENMT: Mucous membranes are moist. Posterior pharynx clear of any exudate or lesions.Normal dentition.  Neck: normal, supple, no masses, no thyromegaly Respiratory: clear to auscultation bilaterally, no wheezing, no crackles. Normal respiratory effort. No accessory muscle use.  Cardiovascular: Regular rate and rhythm, no murmurs / rubs / gallops. No extremity edema. 2+ pedal pulses. No carotid bruits.  Abdomen: no tenderness, no masses palpated. No hepatosplenomegaly. Bowel sounds positive.  Musculoskeletal: no clubbing / cyanosis. No joint deformity upper and lower extremities. Good ROM, no contractures. Normal muscle tone.  Skin: no rashes, lesions, ulcers. No induration Neurologic: CN 2-12 grossly intact. Sensation intact, DTR normal. Strength 5/5 in all 4.  Psychiatric: Normal judgment and insight. Alert and oriented x 3. Normal mood.     Labs on Admission: I have personally reviewed following labs and imaging studies  CBC: Recent Labs  Lab 09/01/23 0758  WBC 9.1  HGB 15.4*  HCT 45.4  MCV 86.0  PLT 312   Basic Metabolic Panel: Recent Labs  Lab 09/01/23 0758  NA 138  K 4.0  CL 102  CO2 25  GLUCOSE 165*  BUN 21  CREATININE 1.37*  CALCIUM  10.0   GFR: Estimated Creatinine Clearance: 56.6 mL/min (A) (by C-G formula based on SCr of 1.37 mg/dL (H)). Liver Function Tests: No results for input(s): AST, ALT, ALKPHOS, BILITOT, PROT, ALBUMIN in the  last 168 hours. No results for input(s): LIPASE, AMYLASE in the last 168 hours. No results for input(s): AMMONIA in the last 168 hours. Coagulation  Profile: Recent Labs  Lab 09/01/23 1011  INR 1.0   Cardiac Enzymes: No results for input(s): CKTOTAL, CKMB, CKMBINDEX, TROPONINI in the last 168 hours. BNP (last 3 results) No results for input(s): PROBNP in the last 8760 hours. HbA1C: No results for input(s): HGBA1C in the last 72 hours. CBG: No results for input(s): GLUCAP in the last 168 hours. Lipid Profile: No results for input(s): CHOL, HDL, LDLCALC, TRIG, CHOLHDL, LDLDIRECT in the last 72 hours. Thyroid  Function Tests: No results for input(s): TSH, T4TOTAL, FREET4, T3FREE, THYROIDAB in the last 72 hours. Anemia Panel: No results for input(s): VITAMINB12, FOLATE, FERRITIN, TIBC, IRON , RETICCTPCT in the last 72 hours. Urine analysis:    Component Value Date/Time   COLORURINE YELLOW 06/02/2023 1129   APPEARANCEUR CLEAR 06/02/2023 1129   LABSPEC 1.010 06/02/2023 1129   PHURINE 6.0 06/02/2023 1129   GLUCOSEU NEGATIVE 06/02/2023 1129   HGBUR TRACE (A) 06/02/2023 1129   BILIRUBINUR NEGATIVE 06/02/2023 1129   BILIRUBINUR negative 01/29/2023 1216   KETONESUR NEGATIVE 06/02/2023 1129   PROTEINUR 30 (A) 06/02/2023 1129   UROBILINOGEN 0.2 01/29/2023 1216   NITRITE NEGATIVE 06/02/2023 1129   LEUKOCYTESUR NEGATIVE 06/02/2023 1129    Radiological Exams on Admission: DG Chest 2 View Result Date: 09/01/2023 CLINICAL DATA:  Left-sided chest pain. EXAM: CHEST - 2 VIEW COMPARISON:  06/02/2023. FINDINGS: Linear area of atelectasis/scarring noted overlying the left lower lung zone. Bilateral lung fields are otherwise clear. Mildly elevated right hemidiaphragm noted. Bilateral costophrenic angles are clear. Normal cardio-mediastinal silhouette. No acute osseous abnormalities. Degenerative changes of bilateral glenohumeral joints noted. The  soft tissues are within normal limits. IMPRESSION: No active cardiopulmonary disease. Electronically Signed   By: Ree Molt M.D.   On: 09/01/2023 08:19    EKG: Independently reviewed.  Sinus rhythm, no acute ST changes.  Assessment/Plan Principal Problem:   NSTEMI (non-ST elevated myocardial infarction) (HCC)  (please populate well all problems here in Problem List. (For example, if patient is on BP meds at home and you resume or decide to hold them, it is a problem that needs to be her. Same for CAD, COPD, HLD and so on)  NSTEMI - Continue ACS medication including heparin  drip, aspirin  and statin -Nitroglycerin  SL -Cardiology consulted - Echocardiogram  Hypothyroidism - Continue Synthroid , Synthroid  dosage has been adjusted according to TSH trending.  Outpatient follow-up by endocrinology  HTN, uncontrolled - Increase hydralazine  dosage from 25 mg to 50 mg 3 times daily - Continue beta-blocker, HCTZ, ACEI, spironolactone   IDDM -SSI - Hold off p.o. diabetic medications for possible incoming LHC  Morbid obesity - BMI= 44 - Continue Mounjaro therapy  DVT prophylaxis: Heparin  drip Code Status: Full code Family Communication: Husband at bedside Disposition Plan: Patient sick with NSTEMI requiring inpatient ischemic workup, expect more than 2 midnight hospital stay Consults called: Cardiology Admission status: PCU admit  Cort ONEIDA Mana MD [facility] Pager [phone]  09/01/2023, 11:25 AM

## 2023-09-02 ENCOUNTER — Encounter: Payer: Self-pay | Admitting: Physician Assistant

## 2023-09-02 ENCOUNTER — Other Ambulatory Visit: Payer: Self-pay

## 2023-09-02 DIAGNOSIS — I200 Unstable angina: Principal | ICD-10-CM

## 2023-09-02 DIAGNOSIS — I214 Non-ST elevation (NSTEMI) myocardial infarction: Secondary | ICD-10-CM | POA: Diagnosis not present

## 2023-09-02 LAB — BASIC METABOLIC PANEL WITH GFR
Anion gap: 10 (ref 5–15)
BUN: 20 mg/dL (ref 8–23)
CO2: 26 mmol/L (ref 22–32)
Calcium: 9.5 mg/dL (ref 8.9–10.3)
Chloride: 102 mmol/L (ref 98–111)
Creatinine, Ser: 1.44 mg/dL — ABNORMAL HIGH (ref 0.44–1.00)
GFR, Estimated: 41 mL/min — ABNORMAL LOW (ref >60)
Glucose, Bld: 219 mg/dL — ABNORMAL HIGH (ref 70–99)
Potassium: 3.6 mmol/L (ref 3.5–5.1)
Sodium: 138 mmol/L (ref 135–145)

## 2023-09-02 LAB — CBC
HCT: 40.8 % (ref 36.0–46.0)
Hemoglobin: 13.8 g/dL (ref 12.0–15.0)
MCH: 29.4 pg (ref 26.0–34.0)
MCHC: 33.8 g/dL (ref 30.0–36.0)
MCV: 87 fL (ref 80.0–100.0)
Platelets: 271 K/uL (ref 150–400)
RBC: 4.69 MIL/uL (ref 3.87–5.11)
RDW: 15 % (ref 11.5–15.5)
WBC: 12.2 K/uL — ABNORMAL HIGH (ref 4.0–10.5)
nRBC: 0 % (ref 0.0–0.2)

## 2023-09-02 LAB — GLUCOSE, CAPILLARY
Glucose-Capillary: 206 mg/dL — ABNORMAL HIGH (ref 70–99)
Glucose-Capillary: 203 mg/dL — ABNORMAL HIGH (ref 70–99)

## 2023-09-02 MED ORDER — ROSUVASTATIN CALCIUM 40 MG PO TABS
40.0000 mg | ORAL_TABLET | Freq: Every day | ORAL | 1 refills | Status: AC
  Filled 2023-09-02: qty 30, 30d supply, fill #0

## 2023-09-02 MED ORDER — ISOSORBIDE MONONITRATE ER 60 MG PO TB24
60.0000 mg | ORAL_TABLET | Freq: Every day | ORAL | 2 refills | Status: AC
  Filled 2023-09-02: qty 30, 30d supply, fill #0
  Filled 2023-11-28: qty 30, 30d supply, fill #1

## 2023-09-02 MED ORDER — CLOPIDOGREL BISULFATE 75 MG PO TABS
75.0000 mg | ORAL_TABLET | Freq: Every day | ORAL | 2 refills | Status: AC
  Filled 2023-09-02: qty 30, 30d supply, fill #0

## 2023-09-02 NOTE — Discharge Summary (Signed)
 Physician Discharge Summary   Patient: Claire Hansen MRN: [id number] DOB: Aug 31, 1960  Admit date:     09/01/2023  Discharge date: 09/02/23  Discharge Physician: Amaryllis Dare   PCP: Orlean Alan [name], FNP   Recommendations at discharge:  Please obtain CBC and BMP on follow-up Follow-up with cardiology Follow-up with primary care provider  Discharge Diagnoses: Principal Problem:   NSTEMI (non-ST elevated myocardial infarction) [facility]) Active Problems:   Unstable angina [facility])   Hospital Course: Taken from H&P.   Claire Hansen is a 63 y.o. female with medical history significant of thyroid  nodule status post total thyroidectomy on chronic Synthroid  therapy, HTN, HLD, chronic HFpEF, IDDM, diabetic neuropathy, morbid obesity, presented with worsening of chest pains.   Patient was having intermittent chest pain since May of this year, had a pharmacologic stress test 2 weeks ago and echo was scheduled for this Friday as outpatient.  Presented to ED due to worsening chest pain lasting longer than her usual.  Current chest pain was radiating to left arm and also associated with nausea.  On presentation blood pressure elevated at 180/90, EKG was negative for any acute ST changes, troponin 150>>160  Patient was started on heparin  infusion and cardiology was consulted, she was taken to Cath Lab for concern of NSTEMI, which shows mild diffuse coronary artery disease, no intervention was done.  8/7: Vitals with mildly elevated blood pressure at 156/85 this morning, no chest pain or shortness of breath.  CBG elevated at 206, slight increase in creatinine to 1.44 and leukocytosis at 12.2-likely reactive after the cath yesterday.  Patient is receiving some IV fluid.  Holding HCTZ and spironolactone  today.  Patient remained chest pain-free and able to ambulate without any recurrence.  Having mild mid sternal reproducible discomfort. Echocardiogram with normal EF and no regional wall motion  abnormalities.  Cardiology added Imdur , Plavix  and increase the dose of Crestor  from 10 mg to 40 mg daily.  Patient did showed some statin intolerance and if she was unable to tolerate higher dosage she can discuss with her cardiologist.  Patient will continue on current medications and need to have a close follow-up with her providers for further assistance.   Consultants: Cardiology Procedures performed: Cardiac catheterization Disposition: Home Diet recommendation:  Discharge Diet Orders (From admission, onward)     Start     Ordered   09/02/23 0000  Diet - low sodium heart healthy        09/02/23 1237           Cardiac and Carb modified diet DISCHARGE MEDICATION: Allergies as of 09/02/2023       Reactions   Shellfish Allergy Anaphylaxis, Nausea And Vomiting   Topical betadine  is OK to use.   Statins Other (See Comments)   Muscle pain/aches, can tolerate rosuvastatin         Medication List     STOP taking these medications    busPIRone  5 MG tablet Commonly known as: BUSPAR    magic mouthwash (lidocaine , diphenhydrAMINE , alum & mag hydroxide) suspension   meloxicam  15 MG tablet Commonly known as: MOBIC        TAKE these medications    acetaminophen  500 MG tablet Commonly known as: TYLENOL  Take 1,000 mg by mouth every 8 (eight) hours as needed for mild pain or headache.   aspirin  EC 81 MG tablet Take 1 tablet (81 mg total) by mouth daily. Swallow whole.   BD Pen Needle Nano U/F 32G X 4 MM Misc Generic drug: Insulin  Pen  Needle USE IN THE MORNING , AT NOON, IN THE EVENING , AND AT BEDTIME   cholecalciferol 25 MCG (1000 UNIT) tablet Commonly known as: VITAMIN D3 Take 1,000 Units by mouth daily.   clopidogrel  75 MG tablet Commonly known as: PLAVIX  Take 1 tablet (75 mg total) by mouth daily. Start taking on: September 03, 2023   Contour Next Test test strip Generic drug: glucose blood USE AS INSTRUCTED TO CHECK BLOOD GLUCOSE UP TO TWICE DAILY    cyanocobalamin 100 MCG tablet Commonly known as: VITAMIN B12 Take 100 mcg by mouth daily.   Farxiga 10 MG Tabs tablet Generic drug: dapagliflozin propanediol Take 10 mg by mouth daily.   fluticasone  50 MCG/ACT nasal spray Commonly known as: FLONASE  Place 1 spray into both nostrils 2 (two) times daily.   gabapentin  300 MG capsule Commonly known as: NEURONTIN  TAKE 1 CAPSULE BY MOUTH IN THE  MORNING AND 2 CAPSULES BY MOUTH  AT BEDTIME   hydrALAZINE  25 MG tablet Commonly known as: APRESOLINE  Take 1 tablet (25 mg total) by mouth 3 (three) times daily.   hydrOXYzine  25 MG tablet Commonly known as: ATARAX  Take 1 tablet (25 mg total) by mouth 3 (three) times daily as needed.   insulin  lispro 100 UNIT/ML injection Commonly known as: HUMALOG  Inject 0.02 mLs (2 Units total) into the skin 3 (three) times daily before meals. As needed when blood sugar is elevated.   isosorbide  mononitrate 60 MG 24 hr tablet Commonly known as: IMDUR  Take 1 tablet (60 mg total) by mouth daily. Start taking on: September 03, 2023   levothyroxine  125 MCG tablet Commonly known as: SYNTHROID  Take 125 mcg by mouth daily before breakfast.   metFORMIN  500 MG 24 hr tablet Commonly known as: GLUCOPHAGE -XR TAKE 1 TABLET BY MOUTH TWICE  DAILY   metoprolol  succinate 50 MG 24 hr tablet Commonly known as: TOPROL -XL TAKE 1 TABLET BY MOUTH DAILY  WITH OR IMMEDIATELY FOLLOWING A  MEAL   montelukast  10 MG tablet Commonly known as: SINGULAIR  TAKE 1 TABLET BY MOUTH DAILY AS  NEEDED FOR ALLERGIES   Mounjaro 7.5 MG/0.5ML Pen Generic drug: tirzepatide Inject 7.5 mg into the skin once a week.   multivitamin tablet Take 1 tablet by mouth daily.   Olmesartan -amLODIPine -HCTZ 40-10-25 MG Tabs TAKE 1 TABLET BY MOUTH ONCE  DAILY   omeprazole 20 MG capsule Commonly known as: PRILOSEC Take 20 mg by mouth every morning.   rosuvastatin  40 MG tablet Commonly known as: CRESTOR  Take 1 tablet (40 mg total) by mouth  daily. Start taking on: September 03, 2023 What changed:  medication strength how much to take additional instructions   spironolactone  25 MG tablet Commonly known as: ALDACTONE  Take 1 tablet (25 mg total) by mouth daily.   traMADol  50 MG tablet Commonly known as: ULTRAM  TAKE 1 TABLET BY MOUTH EVERY 12 HOURS AS NEEDED   valACYclovir  1000 MG tablet Commonly known as: Valtrex  Take 1 tablet (1,000 mg total) by mouth 3 (three) times daily.        Follow-up Information     Fernand Denyse LABOR, MD. Go in 1 week(s).   Specialty: Cardiology Contact information: [address] [place] [address] [phone]         Orlean Alan HERO, FNP. Schedule an appointment as soon as possible for a visit in 1 week(s).   Specialty: Family Medicine Contact information: [address] [place] [address] [phone]  Discharge Exam: Filed Weights   09/01/23 0755  Weight: 122.4 kg   General.  Morbidly obese lady, in no acute distress. Pulmonary.  Lungs clear bilaterally, normal respiratory effort. CV.  Regular rate and rhythm, no JVD, rub or murmur. Abdomen.  Soft, nontender, nondistended, BS positive. CNS.  Alert and oriented .  No focal neurologic deficit. Extremities.  No edema, no cyanosis, pulses intact and symmetrical. Psychiatry.  Judgment and insight appears normal.   Condition at discharge: stable  The results of significant diagnostics from this hospitalization (including imaging, microbiology, ancillary and laboratory) are listed below for reference.   Imaging Studies: CARDIAC CATHETERIZATION Result Date: 09/01/2023   Mid Cx to Dist Cx lesion is 30% stenosed with 30% stenosed side branch in 2nd Mrg.   3rd Mrg lesion is 50% stenosed. 1.  Mild diffuse CAD 2.  Optimize medical therapy   DG Chest 2 View Result Date: 09/01/2023 CLINICAL DATA:  Left-sided chest pain. EXAM: CHEST - 2 VIEW COMPARISON:  06/02/2023. FINDINGS: Linear area of  atelectasis/scarring noted overlying the left lower lung zone. Bilateral lung fields are otherwise clear. Mildly elevated right hemidiaphragm noted. Bilateral costophrenic angles are clear. Normal cardio-mediastinal silhouette. No acute osseous abnormalities. Degenerative changes of bilateral glenohumeral joints noted. The soft tissues are within normal limits. IMPRESSION: No active cardiopulmonary disease. Electronically Signed   By: Ree Molt M.D.   On: 09/01/2023 08:19    Microbiology: Results for orders placed or performed in visit on 03/04/22  Culture, Stool     Status: None   Collection Time: 03/05/22 11:44 AM   Specimen: Stool   ST  Result Value Ref Range Status   Salmonella/Shigella Screen Final report  Final   Stool Culture result 1 (RSASHR) Comment  Final    Comment: No Salmonella or Shigella recovered.   Campylobacter Culture Final report  Final   Stool Culture result 1 (CMPCXR) Comment  Final    Comment: No Campylobacter species isolated.   E coli, Shiga toxin Assay Negative Negative Final    Labs: CBC: Recent Labs  Lab 09/01/23 0758 09/02/23 0443  WBC 9.1 12.2*  HGB 15.4* 13.8  HCT 45.4 40.8  MCV 86.0 87.0  PLT 312 271   Basic Metabolic Panel: Recent Labs  Lab 09/01/23 0758 09/02/23 0443  NA 138 138  K 4.0 3.6  CL 102 102  CO2 25 26  GLUCOSE 165* 219*  BUN 21 20  CREATININE 1.37* 1.44*  CALCIUM  10.0 9.5   Liver Function Tests: No results for input(s): AST, ALT, ALKPHOS, BILITOT, PROT, ALBUMIN in the last 168 hours. CBG: Recent Labs  Lab 09/01/23 1350 09/01/23 2059 09/02/23 0719 09/02/23 1138  GLUCAP 138* 172* 206* 203*    Discharge time spent: greater than 30 minutes.  This record has been created using Conservation officer, historic buildings. Errors have been sought and corrected,but may not always be located. Such creation errors do not reflect on the standard of care.   Signed: Amaryllis Dare, MD [facility] 09/02/2023

## 2023-09-02 NOTE — Hospital Course (Addendum)
 Taken from H&P.   Claire Hansen is a 63 y.o. female with medical history significant of thyroid  nodule status post total thyroidectomy on chronic Synthroid  therapy, HTN, HLD, chronic HFpEF, IDDM, diabetic neuropathy, morbid obesity, presented with worsening of chest pains.   Patient was having intermittent chest pain since May of this year, had a pharmacologic stress test 2 weeks ago and echo was scheduled for this Friday as outpatient.  Presented to ED due to worsening chest pain lasting longer than her usual.  Current chest pain was radiating to left arm and also associated with nausea.  On presentation blood pressure elevated at 180/90, EKG was negative for any acute ST changes, troponin 150>>160  Patient was started on heparin  infusion and cardiology was consulted, she was taken to Cath Lab for concern of NSTEMI, which shows mild diffuse coronary artery disease, no intervention was done.  8/7: Vitals with mildly elevated blood pressure at 156/85 this morning, no chest pain or shortness of breath.  CBG elevated at 206, slight increase in creatinine to 1.44 and leukocytosis at 12.2-likely reactive after the cath yesterday.  Patient is receiving some IV fluid.  Holding HCTZ and spironolactone  today.  Patient remained chest pain-free and able to ambulate without any recurrence.  Having mild mid sternal reproducible discomfort. Echocardiogram with normal EF and no regional wall motion abnormalities.  Cardiology added Imdur , Plavix  and increase the dose of Crestor  from 10 mg to 40 mg daily.  Patient did showed some statin intolerance and if she was unable to tolerate higher dosage she can discuss with her cardiologist.  Patient will continue on current medications and need to have a close follow-up with her providers for further assistance.

## 2023-09-02 NOTE — Progress Notes (Signed)
 [facility] CLINIC CARDIOLOGY PROGRESS NOTE       Patient ID: Claire Hansen MRN: [id number] DOB/AGE: 61-25-1962 61 y.o.  Admit date: 09/01/2023 Referring Physician Dr. Viviann Primary Physician Orlean Alan [name], FNP Primary Cardiologist Dr. Deretha Reason for Consultation chest pain, elevated trops  HPI: Claire Hansen is a 63 y.o. female  with a past medical history of hypertension, hyperlipidemia, type 2 diabetes on insulin , obesity, s/p thyroidectomy (on Synthroid ), hx stroke (2023) who presented to the ED on 09/01/2023 for worsening intermittent chest pain/pressure for past few months, worsening over last few days especially with minimal exertion, radiates to left arm. Patient seen by primary cardiologist, Dr. Nelida, for chest pain and at that time outpatient echocardiogram and stress test were ordered for further evaluation.  Cardiology was consulted for further evaluation.   Interval History: -Patient seen and examined this AM and sitting in bedside chair with husband at bedside. Patient states she feels better this AM and denies chest pain, SOB, lightheadedness or palpitations. Patient states late last night she had chest pain that worsened with Nitropaste. -Patients BP improving and HR stable this AM. Overnight Tele showed no significant events.  -Patient remains on room air with stable SpO2.  -Left radial incision site is clean and dry with no evidence of significant swelling, bruising, or active bleeding.   LHC 08/06 with Dr. Katina   Mid Cx to Dist Cx lesion is 30% stenosed with 30% stenosed side branch in 2nd Mrg.   3rd Mrg lesion is 50% stenosed.   1.  Mild diffuse CAD 2.  Optimize medical therapy (Dr. Katina recommended Imdur  and Plavix )     Review of systems complete and found to be negative unless listed above    Past Medical History:  Diagnosis Date   Abnormal results of thyroid  function studies 03/04/2022   Abnormal results of thyroid  function studies 03/04/2022    Acute CVA (cerebrovascular accident) (HCC) 06/09/2021   Allergy    Anxiety    Asthma    pt denies having asthma   Chronic osteoarthritis    knees   Deviated septum    Diabetes mellitus type 2, uncomplicated (HCC) 03/04/2022   Diabetes mellitus without complication (HCC)    Fever postop 03/04/2022   History of asthma 08/13/2014   Hyperlipidemia    Hypertension    Irregular menstrual cycle    Leg pain, anterior, left 03/22/2020   Lymphadenopathy    Obesity    Peri-menopause    Pneumonia 2015   Post-operative state 01/10/2021   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    Past Surgical History:  Procedure Laterality Date   BACK SURGERY  07/2020   BREAST SURGERY Right    Cyst removed   CESAREAN SECTION     x2   CHOLECYSTECTOMY     CYSTOSCOPY  01/10/2021   Procedure: CYSTOSCOPY;  Surgeon: Schermerhorn, Debby PARAS, MD;  Location: [facility] OR[facility];  Service: Gynecology;;   DILATION AND CURETTAGE OF UTERUS     Treatment of Metorrhagia and Endometrial Ablation   JOINT REPLACEMENT     LEFT HEART CATH AND CORONARY ANGIOGRAPHY N/A 09/01/2023   Procedure: LEFT HEART CATH AND CORONARY ANGIOGRAPHY;  Surgeon: Katina Albright, MD;  Location: [facility];  Service: Cardiovascular;  Laterality: N/A;   PERONEAL NERVE DECOMPRESSION Left 04/29/2020   Procedure: PERONEAL NERVE DECOMPRESSION;  Surgeon: Bluford Standing, MD;  Location: [facility] OR[facility];  Service: Neurosurgery;  Laterality: Left;   SUPRACERVICAL ABDOMINAL HYSTERECTOMY Bilateral 01/10/2021   Procedure:  HYSTERECTOMY ABDOMINAL WITH  BILATERA SALPINGO OOPHORECTOMY;  Surgeon: Schermerhorn, Debby PARAS, MD;  Location: [facility];  Service: Gynecology;  Laterality: Bilateral;   Thyroid   04/05/2023   TONSILLECTOMY     TOTAL HIP ARTHROPLASTY Right 11/05/2016   Procedure: TOTAL HIP ARTHROPLASTY ANTERIOR APPROACH;  Surgeon: Kathlynn Sharper, MD;  Location: [facility];  Service: Orthopedics;  Laterality: Right;   TOTAL HIP ARTHROPLASTY Left 07/01/2017   Procedure: TOTAL HIP  ARTHROPLASTY ANTERIOR APPROACH;  Surgeon: Kathlynn Sharper, MD;  Location: [facility];  Service: Orthopedics;  Laterality: Left;   TOTAL HIP REVISION Left 05/03/2020   Procedure: LEFT FEMORAL COMPONENT REVISION;  Surgeon: Kathlynn Sharper, MD;  Location: [facility];  Service: Orthopedics;  Laterality: Left;    Medications Prior to Admission  Medication Sig Dispense Refill Last Dose/Taking   acetaminophen  (TYLENOL ) 500 MG tablet Take 1,000 mg by mouth every 8 (eight) hours as needed for mild pain or headache.   Unknown   aspirin  EC 81 MG EC tablet Take 1 tablet (81 mg total) by mouth daily. Swallow whole.   Past Month   cholecalciferol (VITAMIN D3) 25 MCG (1000 UNIT) tablet Take 1,000 Units by mouth daily.   08/31/2023   cyanocobalamin (VITAMIN B12) 100 MCG tablet Take 100 mcg by mouth daily.   08/31/2023   FARXIGA 10 MG TABS tablet Take 10 mg by mouth daily.   08/31/2023   fluticasone  (FLONASE ) 50 MCG/ACT nasal spray Place 1 spray into both nostrils 2 (two) times daily. 16 g 3 Unknown   gabapentin  (NEURONTIN ) 300 MG capsule TAKE 1 CAPSULE BY MOUTH IN THE  MORNING AND 2 CAPSULES BY MOUTH  AT BEDTIME 270 capsule 3 Past Week   hydrALAZINE  (APRESOLINE ) 25 MG tablet Take 1 tablet (25 mg total) by mouth 3 (three) times daily. 120 tablet 11 Past Week   hydrOXYzine  (ATARAX ) 25 MG tablet Take 1 tablet (25 mg total) by mouth 3 (three) times daily as needed. 30 tablet 0 Unknown   insulin  lispro (HUMALOG ) 100 UNIT/ML injection Inject 0.02 mLs (2 Units total) into the skin 3 (three) times daily before meals. As needed when blood sugar is elevated. 10 mL 2 Unknown   levothyroxine  (SYNTHROID ) 125 MCG tablet Take 125 mcg by mouth daily before breakfast.   08/31/2023   meloxicam  (MOBIC ) 15 MG tablet TAKE 1 TABLET BY MOUTH DAILY AS  NEEDED FOR PAIN 90 tablet 3 Unknown   metFORMIN  (GLUCOPHAGE -XR) 500 MG 24 hr tablet TAKE 1 TABLET BY MOUTH TWICE  DAILY 180 tablet 3 Past Week   metoprolol  succinate (TOPROL -XL) 50 MG 24 hr tablet TAKE 1  TABLET BY MOUTH DAILY  WITH OR IMMEDIATELY FOLLOWING A  MEAL 90 tablet 3 Past Week   montelukast  (SINGULAIR ) 10 MG tablet TAKE 1 TABLET BY MOUTH DAILY AS  NEEDED FOR ALLERGIES 90 tablet 3 Unknown   MOUNJARO 7.5 MG/0.5ML Pen Inject 7.5 mg into the skin once a week.   Past Week   Multiple Vitamin (MULTIVITAMIN) tablet Take 1 tablet by mouth daily.   08/31/2023   Olmesartan -amLODIPine -HCTZ 40-10-25 MG TABS TAKE 1 TABLET BY MOUTH ONCE  DAILY 90 tablet 3 08/31/2023   omeprazole (PRILOSEC) 20 MG capsule Take 20 mg by mouth every morning.   08/31/2023   rosuvastatin  (CRESTOR ) 10 MG tablet TAKE 1 TABLET BY MOUTH ONCE  DAILY FOR CHOLESTEROL 90 tablet 3 08/31/2023   spironolactone  (ALDACTONE ) 25 MG tablet Take 1 tablet (25 mg total) by mouth daily. 90 tablet 3 08/31/2023   traMADol  (ULTRAM )  50 MG tablet TAKE 1 TABLET BY MOUTH EVERY 12 HOURS AS NEEDED 60 tablet 2 08/31/2023   valACYclovir  (VALTREX ) 1000 MG tablet Take 1 tablet (1,000 mg total) by mouth 3 (three) times daily. 21 tablet 0 Unknown   busPIRone  (BUSPAR ) 5 MG tablet Take 1 tablet (5 mg total) by mouth 2 (two) times daily. (Patient not taking: Reported on 09/01/2023) 60 tablet 0 Not Taking   glucose blood (CONTOUR NEXT TEST) test strip USE AS INSTRUCTED TO CHECK BLOOD GLUCOSE UP TO TWICE DAILY 200 strip 3    Insulin  Pen Needle (BD PEN NEEDLE NANO U/F) 32G X 4 MM MISC USE IN THE MORNING , AT NOON, IN THE EVENING , AND AT BEDTIME 360 each 1    magic mouthwash (lidocaine , diphenhydrAMINE , alum & mag hydroxide) suspension Swish and spit 5 mLs 3 (three) times daily. (Patient not taking: No sig reported) 360 mL 0    Social History   Socioeconomic History   Marital status: Married    Spouse name: Not on file   Number of children: Not on file   Years of education: Not on file   Highest education level: Not on file  Occupational History   Not on file  Tobacco Use   Smoking status: Never   Smokeless tobacco: Never  Vaping Use   Vaping status: Never Used   Substance and Sexual Activity   Alcohol use: No    Alcohol/week: 0.0 standard drinks of alcohol   Drug use: No   Sexual activity: Yes    Partners: Male  Other Topics Concern   Not on file  Social History Narrative   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    1.5 yrs college    Caffeine-    Exercises 3 x weekly    Social Drivers of Health   Financial Resource Strain: Not on file  Food Insecurity: No Food Insecurity (09/01/2023)   Hunger Vital Sign    Worried About Running Out of Food in the Last Year: Never true    Ran Out of Food in the Last Year: Never true  Transportation Needs: No Transportation Needs (09/01/2023)   PRAPARE - Administrator, Civil Service (Medical): No    Lack of Transportation (Non-Medical): No  Physical Activity: Not on file  Stress: Not on file  Social Connections: Not on file  Intimate Partner Violence: Not At Risk (09/01/2023)   Humiliation, Afraid, Rape, and Kick questionnaire    Fear of Current or Ex-Partner: No    Emotionally Abused: No    Physically Abused: No    Sexually Abused: No    Family History  Problem Relation Age of Onset   Asthma Son      Vitals:   09/01/23 2125 09/01/23 2332 09/02/23 0337 09/02/23 0715  BP: (!) 186/107 139/87 (!) 141/75 (!) 156/85  Pulse: 79 89 71 92  Resp:  18 (!) 22 19  Temp: 97.7 F (36.5 C) 98 F (36.7 C) 97.8 F (36.6 C) 97.8 F (36.6 C)  TempSrc:      SpO2: 98% 92% 99% 96%  Weight:      Height:        PHYSICAL EXAM General: Chronically ill-appearing female, well nourished, in no acute distress. HEENT: Normocephalic and atraumatic. Neck: No JVD.   Lungs: Normal respiratory effort on room air. Clear bilaterally to auscultation. No wheezes, crackles, rhonchi.  Heart: HRRR. Normal S1 and S2 without gallops or murmurs.  Abdomen: Non-distended  appearing.  Msk: Normal strength and tone for age. Extremities: Warm and well perfused. No clubbing, cyanosis, edema.  Neuro: Alert and oriented X  3. Psych: Answers questions appropriately.   Labs: Basic Metabolic Panel: Recent Labs    09/01/23 0758 09/02/23 0443  NA 138 138  K 4.0 3.6  CL 102 102  CO2 25 26  GLUCOSE 165* 219*  BUN 21 20  CREATININE 1.37* 1.44*  CALCIUM  10.0 9.5   Liver Function Tests: No results for input(s): AST, ALT, ALKPHOS, BILITOT, PROT, ALBUMIN in the last 72 hours. No results for input(s): LIPASE, AMYLASE in the last 72 hours. CBC: Recent Labs    09/01/23 0758 09/02/23 0443  WBC 9.1 12.2*  HGB 15.4* 13.8  HCT 45.4 40.8  MCV 86.0 87.0  PLT 312 271   Cardiac Enzymes: Recent Labs    09/01/23 0758 09/01/23 1011  TROPONINIHS 152* 160*   BNP: No results for input(s): BNP in the last 72 hours. D-Dimer: No results for input(s): DDIMER in the last 72 hours. Hemoglobin A1C: No results for input(s): HGBA1C in the last 72 hours. Fasting Lipid Panel: No results for input(s): CHOL, HDL, LDLCALC, TRIG, CHOLHDL, LDLDIRECT in the last 72 hours. Thyroid  Function Tests: No results for input(s): TSH, T4TOTAL, T3FREE, THYROIDAB in the last 72 hours.  Invalid input(s): FREET3 Anemia Panel: No results for input(s): VITAMINB12, FOLATE, FERRITIN, TIBC, IRON , RETICCTPCT in the last 72 hours.   Radiology: CARDIAC CATHETERIZATION Result Date: 09/01/2023   Mid Cx to Dist Cx lesion is 30% stenosed with 30% stenosed side branch in 2nd Mrg.   3rd Mrg lesion is 50% stenosed. 1.  Mild diffuse CAD 2.  Optimize medical therapy   DG Chest 2 View Result Date: 09/01/2023 CLINICAL DATA:  Left-sided chest pain. EXAM: CHEST - 2 VIEW COMPARISON:  06/02/2023. FINDINGS: Linear area of atelectasis/scarring noted overlying the left lower lung zone. Bilateral lung fields are otherwise clear. Mildly elevated right hemidiaphragm noted. Bilateral costophrenic angles are clear. Normal cardio-mediastinal silhouette. No acute osseous abnormalities. Degenerative changes of  bilateral glenohumeral joints noted. The soft tissues are within normal limits. IMPRESSION: No active cardiopulmonary disease. Electronically Signed   By: Ree Molt M.D.   On: 09/01/2023 08:19    ECHO pending. (Preliminary read by Dr. Florencio - pEF, no RWMA)  TELEMETRY reviewed by me 09/02/2023: Sinus rhythm, rate 80s  EKG reviewed by me: sinus rhythm, rate 75 bpm without acute ischemic changes  Data reviewed by me 09/02/2023: last 24h vitals tele labs imaging I/O hospitalist progress notes. Principal Problem:   NSTEMI (non-ST elevated myocardial infarction) (HCC)    ASSESSMENT AND PLAN:  Claire Hansen is a 63 y.o. female  with a past medical history of hypertension, hyperlipidemia, type 2 diabetes on insulin , obesity, s/p thyroidectomy (on Synthroid ), hx stroke (2023) who presented to the ED on 09/01/2023 for worsening intermittent chest pain/pressure for past few months, worsening over last few days especially with minimal exertion, radiates to left arm. Patient seen by primary cardiologist, Dr. Nelida, for chest pain and at that time outpatient echocardiogram and stress test were ordered for further evaluation.  Cardiology was consulted for further evaluation.   # NSTEMI # Hypertension # Hyperlipidemia Patient presents with chest pressure a/w SOB, worsens with minimally exertion. Troponins elevated and trending 152 > 160. EKG with sinus rhythm, rate 75 bpm without acute ischemic changes.  Echo this admission revealed pEF, no RWMA . patient underwent diagnostic LHC with Dr. Katina (08/06) and revealed  mild diffuse CAD. -Continue aspirin  81 mg -Continue rosuvastatin  to 40 mg daily. -Continue home metoprolol  succinate 50 mg daily. -Continue Imdur  60 mg daily for antianginal optimization. -Continue home irbesartan -amlodipine -hydrochlorothiazide .  -Continue home spironolactone  25 mg daily. -Continue home hydralazine  25 mg 3 times daily. -Recommend patient see GI outpatient to further  evaluate noncardiac chest pain.  Did the patient have an acute coronary syndrome (MI, NSTEMI, STEMI, etc) this admission?:  Yes                               AHA/ACC ACS Clinical Performance & Quality Measures: Aspirin  prescribed? - Yes ADP Receptor Inhibitor (Plavix /Clopidogrel , Brilinta/Ticagrelor or Effient/Prasugrel) prescribed (includes medically managed patients)? - Yes Beta Blocker prescribed? - Yes High Intensity Statin (Lipitor 40-80mg  or Crestor  20-40mg ) prescribed? - Yes EF assessed during THIS hospitalization? - Yes For EF <40%, was ACEI/ARB prescribed? - Yes For EF <40%, Aldosterone Antagonist (Spironolactone  or Eplerenone) prescribed? - Yes Cardiac Rehab Phase II ordered (including medically managed patients)? - No - not indicated as patient LHC revealed mild CAD, no intervention required.    Ok for discharge today from a cardiac perspective. Follow up in clinic with Dr. Deretha in 1-2 weeks. Will sign off.   This patient's plan of care was discussed and created with Dr. Florencio and he is in agreement.  Signed: Dorene Comfort, PA-C  09/02/2023, 9:23 AM [facility]

## 2023-09-02 NOTE — TOC CM/SW Note (Signed)
 Transition of Care [facility]) - Inpatient Brief Assessment   Patient Details  Name: Claire Hansen MRN: [id number] Date of Birth: 10-31-1960  Transition of Care [facility]) CM/SW Contact:    Lauraine JAYSON Carpen, LCSW Phone Number: 09/02/2023, 1:20 PM   Clinical Narrative: Patient has orders to discharge home today. Chart reviewed. No TOC needs identified. CSW signing off.  Transition of Care Asessment: Insurance and Status: Insurance coverage has been reviewed Patient has primary care physician: Yes Home environment has been reviewed: Apartment Prior level of function:: Not documented Prior/Current Home Services: No current home services Social Drivers of Health Review: SDOH reviewed no interventions necessary Readmission risk has been reviewed: Yes Transition of care needs: no transition of care needs at this time

## 2023-09-03 ENCOUNTER — Other Ambulatory Visit

## 2023-09-03 LAB — ECHOCARDIOGRAM COMPLETE
Area-P 1/2: 3.62 cm2
Height: 65 in
S' Lateral: 2.4 cm
Weight: 4316.78 [oz_av]

## 2023-09-03 LAB — LIPOPROTEIN A (LPA): Lipoprotein (a): 28 nmol/L (ref <75.0)

## 2023-09-07 ENCOUNTER — Ambulatory Visit: Admitting: Cardiovascular Disease

## 2023-09-07 ENCOUNTER — Ambulatory Visit: Admitting: Cardiology

## 2023-09-09 ENCOUNTER — Encounter: Payer: Self-pay | Admitting: Cardiovascular Disease

## 2023-09-09 ENCOUNTER — Ambulatory Visit (INDEPENDENT_AMBULATORY_CARE_PROVIDER_SITE_OTHER): Admitting: Cardiovascular Disease

## 2023-09-09 VITALS — BP 124/78 | HR 71 | Ht 65.0 in | Wt 275.2 lb

## 2023-09-09 DIAGNOSIS — R0789 Other chest pain: Secondary | ICD-10-CM

## 2023-09-09 DIAGNOSIS — I10 Essential (primary) hypertension: Secondary | ICD-10-CM

## 2023-09-09 DIAGNOSIS — R7989 Other specified abnormal findings of blood chemistry: Secondary | ICD-10-CM

## 2023-09-09 DIAGNOSIS — I25118 Atherosclerotic heart disease of native coronary artery with other forms of angina pectoris: Secondary | ICD-10-CM

## 2023-09-09 DIAGNOSIS — E782 Mixed hyperlipidemia: Secondary | ICD-10-CM | POA: Diagnosis not present

## 2023-09-09 NOTE — Progress Notes (Signed)
 Cardiology Office Note   Date:  09/09/2023   ID:  Claire Hansen, DOB 07/08/1960, MRN [id number]  PCP:  Orlean Alan [name], FNP  Cardiologist:  Denyse Bathe, MD      History of Present Illness: Claire Hansen is a 62 y.o. female who presents for  Chief Complaint  Patient presents with   Follow-up    Nst results     Was in [facility] with chest pain, and better now on omeprazole.      Past Medical History:  Diagnosis Date   Abnormal results of thyroid  function studies 03/04/2022   Abnormal results of thyroid  function studies 03/04/2022   Acute CVA (cerebrovascular accident) (HCC) 06/09/2021   Allergy    Anxiety    Asthma    pt denies having asthma   Chronic osteoarthritis    knees   Deviated septum    Diabetes mellitus type 2, uncomplicated (HCC) 03/04/2022   Diabetes mellitus without complication (HCC)    Fever postop 03/04/2022   History of asthma 08/13/2014   Hyperlipidemia    Hypertension    Irregular menstrual cycle    Leg pain, anterior, left 03/22/2020   Lymphadenopathy    Obesity    Peri-menopause    Pneumonia 2015   Post-operative state 01/10/2021   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     Past Surgical History:  Procedure Laterality Date   BACK SURGERY  07/2020   BREAST SURGERY Right    Cyst removed   CESAREAN SECTION     x2   CHOLECYSTECTOMY     CYSTOSCOPY  01/10/2021   Procedure: CYSTOSCOPY;  Surgeon: Schermerhorn, Debby PARAS, MD;  Location: [facility];  Service: Gynecology;;   DILATION AND CURETTAGE OF UTERUS     Treatment of Metorrhagia and Endometrial Ablation   JOINT REPLACEMENT     LEFT HEART CATH AND CORONARY ANGIOGRAPHY N/A 09/01/2023   Procedure: LEFT HEART CATH AND CORONARY ANGIOGRAPHY;  Surgeon: Katina Albright, MD;  Location: [facility];  Service: Cardiovascular;  Laterality: N/A;   PERONEAL NERVE DECOMPRESSION Left 04/29/2020   Procedure: PERONEAL NERVE DECOMPRESSION;  Surgeon: Bluford Standing, MD;  Location: [facility];   Service: Neurosurgery;  Laterality: Left;   SUPRACERVICAL ABDOMINAL HYSTERECTOMY Bilateral 01/10/2021   Procedure: HYSTERECTOMY ABDOMINAL WITH  BILATERA SALPINGO OOPHORECTOMY;  Surgeon: Schermerhorn, Debby PARAS, MD;  Location: [facility];  Service: Gynecology;  Laterality: Bilateral;   Thyroid   04/05/2023   TONSILLECTOMY     TOTAL HIP ARTHROPLASTY Right 11/05/2016   Procedure: TOTAL HIP ARTHROPLASTY ANTERIOR APPROACH;  Surgeon: Kathlynn Sharper, MD;  Location: [facility];  Service: Orthopedics;  Laterality: Right;   TOTAL HIP ARTHROPLASTY Left 07/01/2017   Procedure: TOTAL HIP ARTHROPLASTY ANTERIOR APPROACH;  Surgeon: Kathlynn Sharper, MD;  Location: [facility];  Service: Orthopedics;  Laterality: Left;   TOTAL HIP REVISION Left 05/03/2020   Procedure: LEFT FEMORAL COMPONENT REVISION;  Surgeon: Kathlynn Sharper, MD;  Location: [facility];  Service: Orthopedics;  Laterality: Left;     Current Outpatient Medications  Medication Sig Dispense Refill   acetaminophen  (TYLENOL ) 500 MG tablet Take 1,000 mg by mouth every 8 (eight) hours as needed for mild pain or headache.     aspirin  EC 81 MG EC tablet Take 1 tablet (81 mg total) by mouth daily. Swallow whole.     cholecalciferol (VITAMIN D3) 25 MCG (1000 UNIT) tablet Take 1,000 Units by mouth daily.     cyanocobalamin (VITAMIN B12) 100 MCG tablet Take 100 mcg  by mouth daily.     fluticasone  (FLONASE ) 50 MCG/ACT nasal spray Place 1 spray into both nostrils 2 (two) times daily. 16 g 3   gabapentin  (NEURONTIN ) 300 MG capsule TAKE 1 CAPSULE BY MOUTH IN THE  MORNING AND 2 CAPSULES BY MOUTH  AT BEDTIME 270 capsule 3   glucose blood (CONTOUR NEXT TEST) test strip USE AS INSTRUCTED TO CHECK BLOOD GLUCOSE UP TO TWICE DAILY 200 strip 3   hydrALAZINE  (APRESOLINE ) 25 MG tablet Take 1 tablet (25 mg total) by mouth 3 (three) times daily. 120 tablet 11   hydrOXYzine  (ATARAX ) 25 MG tablet Take 1 tablet (25 mg total) by mouth 3 (three) times daily as needed. 30 tablet 0   insulin   lispro (HUMALOG ) 100 UNIT/ML injection Inject 0.02 mLs (2 Units total) into the skin 3 (three) times daily before meals. As needed when blood sugar is elevated. 10 mL 2   Insulin  Pen Needle (BD PEN NEEDLE NANO U/F) 32G X 4 MM MISC USE IN THE MORNING , AT NOON, IN THE EVENING , AND AT BEDTIME 360 each 1   levothyroxine  (SYNTHROID ) 125 MCG tablet Take 125 mcg by mouth daily before breakfast.     metFORMIN  (GLUCOPHAGE -XR) 500 MG 24 hr tablet TAKE 1 TABLET BY MOUTH TWICE  DAILY 180 tablet 3   metoprolol  succinate (TOPROL -XL) 50 MG 24 hr tablet TAKE 1 TABLET BY MOUTH DAILY  WITH OR IMMEDIATELY FOLLOWING A  MEAL 90 tablet 3   montelukast  (SINGULAIR ) 10 MG tablet TAKE 1 TABLET BY MOUTH DAILY AS  NEEDED FOR ALLERGIES 90 tablet 3   MOUNJARO 7.5 MG/0.5ML Pen Inject 7.5 mg into the skin once a week.     Multiple Vitamin (MULTIVITAMIN) tablet Take 1 tablet by mouth daily.     Olmesartan -amLODIPine -HCTZ 40-10-25 MG TABS TAKE 1 TABLET BY MOUTH ONCE  DAILY 90 tablet 3   omeprazole (PRILOSEC) 20 MG capsule Take 20 mg by mouth every morning.     rosuvastatin  (CRESTOR ) 40 MG tablet Take 1 tablet (40 mg total) by mouth daily. 90 tablet 1   spironolactone  (ALDACTONE ) 25 MG tablet Take 1 tablet (25 mg total) by mouth daily. 90 tablet 3   traMADol  (ULTRAM ) 50 MG tablet TAKE 1 TABLET BY MOUTH EVERY 12 HOURS AS NEEDED 60 tablet 2   valACYclovir  (VALTREX ) 1000 MG tablet Take 1 tablet (1,000 mg total) by mouth 3 (three) times daily. 21 tablet 0   clopidogrel  (PLAVIX ) 75 MG tablet Take 1 tablet (75 mg total) by mouth daily. (Patient not taking: Reported on 09/09/2023) 30 tablet 2   FARXIGA 10 MG TABS tablet Take 10 mg by mouth daily. (Patient not taking: Reported on 09/09/2023)     isosorbide  mononitrate (IMDUR ) 60 MG 24 hr tablet Take 1 tablet (60 mg total) by mouth daily. (Patient not taking: Reported on 09/09/2023) 30 tablet 2   No current facility-administered medications for this visit.    Allergies:   Shellfish allergy  and Statins    Social History:   reports that she has never smoked. She has never used smokeless tobacco. She reports that she does not drink alcohol and does not use drugs.   Family History:  family history includes Asthma in her son.    ROS:     Review of Systems  Constitutional: Negative.   HENT: Negative.    Eyes: Negative.   Respiratory: Negative.    Gastrointestinal: Negative.   Genitourinary: Negative.   Musculoskeletal: Negative.   Skin: Negative.  Neurological: Negative.   Endo/Heme/Allergies: Negative.   Psychiatric/Behavioral: Negative.    All other systems reviewed and are negative.     All other systems are reviewed and negative.    PHYSICAL EXAM: VS:  BP 124/78   Pulse 71   Ht 5' 5 (1.651 m)   Wt 275 lb 3.2 oz (124.8 kg)   LMP 06/04/2016   SpO2 98%   BMI 45.80 kg/m  , BMI Body mass index is 45.8 kg/m. Last weight:  Wt Readings from Last 3 Encounters:  09/09/23 275 lb 3.2 oz (124.8 kg)  09/01/23 269 lb 12.8 oz (122.4 kg)  08/24/23 269 lb 12.8 oz (122.4 kg)     Physical Exam Constitutional:      Appearance: Normal appearance.  Cardiovascular:     Rate and Rhythm: Normal rate and regular rhythm.     Heart sounds: Normal heart sounds.  Pulmonary:     Effort: Pulmonary effort is normal.     Breath sounds: Normal breath sounds.  Musculoskeletal:     Right lower leg: No edema.     Left lower leg: No edema.  Neurological:     Mental Status: She is alert.       EKG:   Recent Labs: 08/24/2023: ALT 17; TSH 7.780 09/02/2023: BUN 20; Creatinine, Ser 1.44; Hemoglobin 13.8; Platelets 271; Potassium 3.6; Sodium 138    Lipid Panel    Component Value Date/Time   CHOL 130 08/24/2023 1059   TRIG 63 08/24/2023 1059   HDL 32 (L) 08/24/2023 1059   CHOLHDL 4.1 08/24/2023 1059   CHOLHDL 6.4 06/10/2021 0445   VLDL 63 (H) 06/10/2021 0445   LDLCALC 85 08/24/2023 1059      Other studies Reviewed: Additional studies/ records that were reviewed today  include:  Review of the above records demonstrates:       No data to display            ASSESSMENT AND PLAN:    ICD-10-CM   1. Troponin level elevated  R79.89     2. Other chest pain  R07.89    Non cardiac chest pain, advise GI to see.    3. Essential hypertension  I10     4. Mixed hyperlipidemia  E78.2     5. Coronary artery disease of native artery of native heart with stable angina pectoris (HCC)  I25.118    Had 30% LCX and 50% OM3, on cath 09/01/23, chest pain better with treatment of GERD. Stress test was fine.       Problem List Items Addressed This Visit       Cardiovascular and Mediastinum   Essential hypertension     Other   Hyperlipidemia   Other Visit Diagnoses       Troponin level elevated    -  Primary     Other chest pain       Non cardiac chest pain, advise GI to see.     Coronary artery disease of native artery of native heart with stable angina pectoris (HCC)       Had 30% LCX and 50% OM3, on cath 09/01/23, chest pain better with treatment of GERD. Stress test was fine.          Disposition:   Return in about 2 months (around 11/09/2023).    Total time spent: 30 minutes  Signed,  Denyse Bathe, MD  09/09/2023 9:44 AM    [facility]

## 2023-09-10 ENCOUNTER — Other Ambulatory Visit: Payer: Self-pay | Admitting: *Deleted

## 2023-09-13 ENCOUNTER — Ambulatory Visit: Payer: Self-pay | Admitting: Family

## 2023-09-13 NOTE — Progress Notes (Signed)
 Patient notified

## 2023-09-19 ENCOUNTER — Other Ambulatory Visit: Payer: Self-pay | Admitting: Family

## 2023-09-21 ENCOUNTER — Ambulatory Visit: Admitting: Physician Assistant

## 2023-09-21 ENCOUNTER — Other Ambulatory Visit: Payer: Self-pay

## 2023-09-21 ENCOUNTER — Encounter: Payer: Self-pay | Admitting: Physician Assistant

## 2023-09-21 VITALS — BP 138/72 | HR 68 | Ht 65.0 in | Wt 278.0 lb

## 2023-09-21 DIAGNOSIS — Z9089 Acquired absence of other organs: Secondary | ICD-10-CM

## 2023-09-21 DIAGNOSIS — Z7985 Long-term (current) use of injectable non-insulin antidiabetic drugs: Secondary | ICD-10-CM

## 2023-09-21 DIAGNOSIS — Z1211 Encounter for screening for malignant neoplasm of colon: Secondary | ICD-10-CM

## 2023-09-21 DIAGNOSIS — R0789 Other chest pain: Secondary | ICD-10-CM

## 2023-09-21 MED ORDER — OMEPRAZOLE 40 MG PO CPDR: 40.0000 mg | DELAYED_RELEASE_CAPSULE | Freq: Two times a day (BID) | ORAL | 2 refills | Status: DC

## 2023-09-21 MED ORDER — OMEPRAZOLE 40 MG PO CPDR: 40.0000 mg | DELAYED_RELEASE_CAPSULE | Freq: Two times a day (BID) | ORAL | 2 refills | Status: AC

## 2023-09-21 NOTE — Progress Notes (Signed)
 ____________________________________________________________  Attending physician addendum:  Thank you for sending this case to me. I have reviewed the entire note and agree with the plan.  When she is ready to be scheduled for the EGD and colonoscopy after cardiology clearance that you mentioned, the EGD should also be done with placement of a Bravo pH device, with the patient off any acid suppression medicine for 5 days prior.  Otherwise, it would be difficult to determine if this chest pain seems to be reflux related.  Victory Brand, MD  ____________________________________________________________

## 2023-09-21 NOTE — Progress Notes (Signed)
 Chief Complaint: Atypical chest pain  HPI:    [name] is a 63 year old African-American female with a past medical history as listed below including CVA on Plavix  (09/02/2023 echo with LVEF 65-70% no regional wall motion abnormalities), thyroid  nodule status post total thyroidectomy on chronic Synthroid , chronic heart failure preserved ejection fraction, obesity, diabetes and multiple others, who was referred to me by Orlean Alan HERO, FNP for a complaint of atypical chest pain    08/13/2023 patient follows with endocrinology at [facility].  They were still working to get her thyroid  regulated after her thyroidectomy.    09/01/2023-09/02/2023 patient admitted for possible NSTEMI and unstable angina.  At time of admission patient described intermittent chest pain since May and had a pharmacological stress test 2 weeks ago, ended up in the ER due to worsening chest pain.  Troponin 150--> 160, BP 180/90.  She was started on a heparin  infusion and cardiology was consulted.  Taken to Cath Lab for concern of NSTEMI which only showed mild diffuse coronary artery disease.  No intervention done.    09/02/2023 CBC with a white count of 12.2, glucose 172, BMP with a creatinine of 1.37 (around her baseline).    09/09/2023 patient followed with cardiology and at that time discussed noncardiac chest pain and recommended to see us .  She has a history of CAD with stable angina pectoralis.  Chest pain got better with treatment of GERD.  Stress test was normal.    Today, patient presents to clinic accompanied by her husband.  She started noticing symptoms back in February/March just after she had a thyroidectomy and was placed on thyroid  medicine.  Tells me they are having trouble regulating her thyroid  even still and she is on various doses of thyroid  medicine every other day.  Describes symptoms of chest pain which she describes in the middle of her chest that feels like a tightening.  These occur randomly.  She has been to the  ER as above with troponins elevated at the time and had a full workup including stress test, catheterization and echo.  Cardiology does not feel like it is heart related at this point and wanted her to be seen by us  for further workup of possible GI symptoms.  Patient does tell me that she has been taking Omeprazole  20 mg as soon as she wakes up and does feel like this has helped a little bit with the frequency of the symptoms.  Denies any increase when she eats.  Interestingly she was also started on Mounjaro right around the same time this chest pain/discomfort started.    Patient does have Plavix  listed on her medications but tells me that she was told she could take this or not take it and so she is not taking it anymore.    Denies fever, chills, weight loss, nausea, vomiting or symptoms that awaken her from sleep.  Past Medical History:  Diagnosis Date   Abnormal results of thyroid  function studies 03/04/2022   Abnormal results of thyroid  function studies 03/04/2022   Acute CVA (cerebrovascular accident) (HCC) 06/09/2021   Allergy    Anxiety    Asthma    pt denies having asthma   Chronic osteoarthritis    knees   Deviated septum    Diabetes mellitus type 2, uncomplicated (HCC) 03/04/2022   Diabetes mellitus without complication (HCC)    Fever postop 03/04/2022   History of asthma 08/13/2014   Hyperlipidemia    Hypertension    Irregular menstrual cycle  Leg pain, anterior, left 03/22/2020   Lymphadenopathy    Obesity    Peri-menopause    Pneumonia 2015   Post-operative state 01/10/2021   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    Past Surgical History:  Procedure Laterality Date   BACK SURGERY  07/2020   BREAST SURGERY Right    Cyst removed   CESAREAN SECTION     x2   CHOLECYSTECTOMY     CYSTOSCOPY  01/10/2021   Procedure: CYSTOSCOPY;  Surgeon: Schermerhorn, Debby PARAS, MD;  Location: [facility] OR[facility];  Service: Gynecology;;   DILATION AND CURETTAGE OF UTERUS     Treatment of  Metorrhagia and Endometrial Ablation   JOINT REPLACEMENT     LEFT HEART CATH AND CORONARY ANGIOGRAPHY N/A 09/01/2023   Procedure: LEFT HEART CATH AND CORONARY ANGIOGRAPHY;  Surgeon: Katina Albright, MD;  Location: [facility];  Service: Cardiovascular;  Laterality: N/A;   PERONEAL NERVE DECOMPRESSION Left 04/29/2020   Procedure: PERONEAL NERVE DECOMPRESSION;  Surgeon: Bluford Standing, MD;  Location: [facility];  Service: Neurosurgery;  Laterality: Left;   SUPRACERVICAL ABDOMINAL HYSTERECTOMY Bilateral 01/10/2021   Procedure: HYSTERECTOMY ABDOMINAL WITH  BILATERA SALPINGO OOPHORECTOMY;  Surgeon: Schermerhorn, Debby PARAS, MD;  Location: [facility];  Service: Gynecology;  Laterality: Bilateral;   Thyroid   04/05/2023   TONSILLECTOMY     TOTAL HIP ARTHROPLASTY Right 11/05/2016   Procedure: TOTAL HIP ARTHROPLASTY ANTERIOR APPROACH;  Surgeon: Kathlynn Sharper, MD;  Location: [facility];  Service: Orthopedics;  Laterality: Right;   TOTAL HIP ARTHROPLASTY Left 07/01/2017   Procedure: TOTAL HIP ARTHROPLASTY ANTERIOR APPROACH;  Surgeon: Kathlynn Sharper, MD;  Location: [facility];  Service: Orthopedics;  Laterality: Left;   TOTAL HIP REVISION Left 05/03/2020   Procedure: LEFT FEMORAL COMPONENT REVISION;  Surgeon: Kathlynn Sharper, MD;  Location: [facility];  Service: Orthopedics;  Laterality: Left;    Current Outpatient Medications  Medication Sig Dispense Refill   acetaminophen  (TYLENOL ) 500 MG tablet Take 1,000 mg by mouth every 8 (eight) hours as needed for mild pain or headache.     aspirin  EC 81 MG EC tablet Take 1 tablet (81 mg total) by mouth daily. Swallow whole.     cholecalciferol (VITAMIN D3) 25 MCG (1000 UNIT) tablet Take 1,000 Units by mouth daily.     clopidogrel  (PLAVIX ) 75 MG tablet Take 1 tablet (75 mg total) by mouth daily. (Patient not taking: Reported on 09/09/2023) 30 tablet 2   cyanocobalamin (VITAMIN B12) 100 MCG tablet Take 100 mcg by mouth daily.     FARXIGA 10 MG TABS tablet Take 10 mg by mouth  daily. (Patient not taking: Reported on 09/09/2023)     fluticasone  (FLONASE ) 50 MCG/ACT nasal spray Place 1 spray into both nostrils 2 (two) times daily. 16 g 3   gabapentin  (NEURONTIN ) 300 MG capsule TAKE 1 CAPSULE BY MOUTH IN THE  MORNING AND 2 CAPSULES BY MOUTH  AT BEDTIME 270 capsule 3   glucose blood (CONTOUR NEXT TEST) test strip USE AS INSTRUCTED TO CHECK BLOOD GLUCOSE UP TO TWICE DAILY 200 strip 3   hydrALAZINE  (APRESOLINE ) 25 MG tablet Take 1 tablet (25 mg total) by mouth 3 (three) times daily. 120 tablet 11   hydrOXYzine  (ATARAX ) 25 MG tablet Take 1 tablet (25 mg total) by mouth 3 (three) times daily as needed. 30 tablet 0   insulin  lispro (HUMALOG ) 100 UNIT/ML injection Inject 0.02 mLs (2 Units total) into the skin 3 (three) times daily before meals. As needed when blood sugar is  elevated. 10 mL 2   Insulin  Pen Needle (BD PEN NEEDLE NANO U/F) 32G X 4 MM MISC USE IN THE MORNING , AT NOON, IN THE EVENING , AND AT BEDTIME 360 each 1   isosorbide  mononitrate (IMDUR ) 60 MG 24 hr tablet Take 1 tablet (60 mg total) by mouth daily. (Patient not taking: Reported on 09/09/2023) 30 tablet 2   levothyroxine  (SYNTHROID ) 125 MCG tablet Take 125 mcg by mouth daily before breakfast.     metFORMIN  (GLUCOPHAGE -XR) 500 MG 24 hr tablet TAKE 1 TABLET BY MOUTH TWICE  DAILY 180 tablet 3   metoprolol  succinate (TOPROL -XL) 50 MG 24 hr tablet TAKE 1 TABLET BY MOUTH DAILY  WITH OR IMMEDIATELY FOLLOWING A  MEAL 90 tablet 3   montelukast  (SINGULAIR ) 10 MG tablet TAKE 1 TABLET BY MOUTH DAILY AS  NEEDED FOR ALLERGIES 90 tablet 3   MOUNJARO 7.5 MG/0.5ML Pen Inject 7.5 mg into the skin once a week.     Multiple Vitamin (MULTIVITAMIN) tablet Take 1 tablet by mouth daily.     Olmesartan -amLODIPine -HCTZ 40-10-25 MG TABS TAKE 1 TABLET BY MOUTH ONCE  DAILY 90 tablet 3   omeprazole  (PRILOSEC) 20 MG capsule Take 20 mg by mouth every morning.     rosuvastatin  (CRESTOR ) 40 MG tablet Take 1 tablet (40 mg total) by mouth daily. 90  tablet 1   spironolactone  (ALDACTONE ) 25 MG tablet Take 1 tablet (25 mg total) by mouth daily. 90 tablet 3   traMADol  (ULTRAM ) 50 MG tablet TAKE 1 TABLET BY MOUTH EVERY 12 HOURS AS NEEDED 60 tablet 2   valACYclovir  (VALTREX ) 1000 MG tablet Take 1 tablet (1,000 mg total) by mouth 3 (three) times daily. 21 tablet 0   No current facility-administered medications for this visit.    Allergies as of 09/21/2023 - Review Complete 09/09/2023  Allergen Reaction Noted   Shellfish allergy Anaphylaxis and Nausea And Vomiting 05/15/2015   Statins Other (See Comments) 03/04/2022    Family History  Problem Relation Age of Onset   Asthma Son     Social History   Socioeconomic History   Marital status: Married    Spouse name: Not on file   Number of children: Not on file   Years of education: Not on file   Highest education level: Not on file  Occupational History   Not on file  Tobacco Use   Smoking status: Never   Smokeless tobacco: Never  Vaping Use   Vaping status: Never Used  Substance and Sexual Activity   Alcohol use: No    Alcohol/week: 0.0 standard drinks of alcohol   Drug use: No   Sexual activity: Yes    Partners: Male  Other Topics Concern   Not on file  Social History Narrative   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    1.5 yrs college    Caffeine-    Exercises 3 x weekly    Social Drivers of Health   Financial Resource Strain: Not on file  Food Insecurity: No Food Insecurity (09/01/2023)   Hunger Vital Sign    Worried About Running Out of Food in the Last Year: Never true    Ran Out of Food in the Last Year: Never true  Transportation Needs: No Transportation Needs (09/01/2023)   PRAPARE - Administrator, Civil Service (Medical): No    Lack of Transportation (Non-Medical): No  Physical Activity: Not on file  Stress: Not on file  Social Connections:  Not on file  Intimate Partner Violence: Not At Risk (09/01/2023)   Humiliation, Afraid, Rape, and Kick  questionnaire    Fear of Current or Ex-Partner: No    Emotionally Abused: No    Physically Abused: No    Sexually Abused: No    Review of Systems:    Constitutional: No weight loss, fever or chills Skin: No rash  Cardiovascular: See HPI   Respiratory: No SOB  Gastrointestinal: See HPI and otherwise negative Genitourinary: No dysuria Neurological: No headache, dizziness or syncope Musculoskeletal: No new muscle or joint pain Hematologic: No bleeding Psychiatric: No history of depression or anxiety   Physical Exam:  Vital signs: BP 138/72   Pulse 68   Ht 5' 5 (1.651 m)   Wt 278 lb (126.1 kg)   LMP 06/04/2016   BMI 46.26 kg/m    Constitutional:   Pleasant obese AA female appears to be in NAD, Well developed, Well nourished, alert and cooperative Head:  Normocephalic and atraumatic. Eyes:   PEERL, EOMI. No icterus. Conjunctiva pink. Ears:  Normal auditory acuity. Neck:  Supple Throat: Oral cavity and pharynx without inflammation, swelling or lesion.  Respiratory: Respirations even and unlabored. Lungs clear to auscultation bilaterally.   No wheezes, crackles, or rhonchi.  Cardiovascular: Normal S1, S2. No MRG. Regular rate and rhythm. No peripheral edema, cyanosis or pallor.  Gastrointestinal:  Soft, nondistended, nontender. No rebound or guarding. Normal bowel sounds. No appreciable masses or hepatomegaly. Rectal:  Not performed.  Msk:  Symmetrical without gross deformities. Without edema, no deformity or joint abnormality. +uses walker to ambulate Neurologic:  Alert and  oriented x4;  grossly normal neurologically.  Skin:   Dry and intact without significant lesions or rashes. Psychiatric: Demonstrates good judgement and reason without abnormal affect or behaviors.  MOST RECENT LABS AND IMAGING: CBC    Component Value Date/Time   WBC 12.2 (H) 09/02/2023 0443   RBC 4.69 09/02/2023 0443   HGB 13.8 09/02/2023 0443   HGB 14.9 08/24/2023 1059   HCT 40.8 09/02/2023 0443    HCT 44.3 08/24/2023 1059   PLT 271 09/02/2023 0443   PLT 302 08/24/2023 1059   MCV 87.0 09/02/2023 0443   MCV 89 08/24/2023 1059   MCH 29.4 09/02/2023 0443   MCHC 33.8 09/02/2023 0443   RDW 15.0 09/02/2023 0443   RDW 15.8 (H) 08/24/2023 1059   LYMPHSABS 2.9 08/24/2023 1059   MONOABS 0.5 06/09/2021 1210   EOSABS 0.4 08/24/2023 1059   BASOSABS 0.0 08/24/2023 1059    CMP     Component Value Date/Time   NA 138 09/02/2023 0443   NA 137 08/24/2023 1059   K 3.6 09/02/2023 0443   CL 102 09/02/2023 0443   CO2 26 09/02/2023 0443   GLUCOSE 219 (H) 09/02/2023 0443   BUN 20 09/02/2023 0443   BUN 21 08/24/2023 1059   CREATININE 1.44 (H) 09/02/2023 0443   CALCIUM  9.5 09/02/2023 0443   PROT 6.4 08/24/2023 1059   ALBUMIN 4.1 08/24/2023 1059   AST 9 08/24/2023 1059   ALT 17 08/24/2023 1059   ALKPHOS 31 (L) 08/24/2023 1059   BILITOT 0.7 08/24/2023 1059   GFRNONAA 41 (L) 09/02/2023 0443   GFRAA 50 (L) 07/02/2017 0422    Assessment: 1.  Atypical chest pain: Started around March of this year, admitted to the hospital recently with a full workup including cardiac cath, follow-up with cardiology after that, they discussed possible GI etiology which is a possibility given that symptoms are  some better with Omeprazole  20 mg daily, this does not explain her increase in troponins though; consider gastric etiology versus other 2.  Status post thyroidectomy: In March of this year, endocrinology is working on regulating her thyroid  3.  Mounjaro use: Discussed that this could be contributing to symptoms, symptoms all started after she was given this medication, but it was the same time as thyroidectomy as well which complicates the picture  Plan: 1.  Discussed with patient that it could be that her GI system is adding to her atypical chest pain, especially since her Omeprazole  seems to be helping.  From my review it looks like cardiology has done a full workup and do not feel like this is necessarily  cardiac in origin though her troponins have been elevated. 2.  Discussed also that Mounjaro could be playing a role as symptoms seem to start shortly after she started this medication.  Would recommend further discussion with endocrinology. 3.  Increase Omeprazole  to 40 mg twice daily, 30-60 minutes for breakfast and dinner.  Prescribe #60 with 5 refills.  Explained that if her symptoms are related to reflux this should help. 4.  Patient would benefit from a diagnostic EGD and a screening colonoscopy, but with recent cardiac issues and thyroidectomy will send clearance to cardiology prior to procedures. 5.  Patient also tells me she is no longer using Plavix .  Will get clearance for this as well.  Just in case. 6.  Patient assigned to Dr. Legrand today.  Patient can hopefully be scheduled for EGD and colonoscopy once we receive cardiac clearance.  Delon Failing, PA-C [facility] 09/21/2023, 10:05 AM  Cc: Orlean Alan HERO, FNP

## 2023-09-21 NOTE — Patient Instructions (Signed)
 Increase your Omeprazole  to 40 mg twice daily.   _______________________________________________________  If your blood pressure at your visit was 140/90 or greater, please contact your primary care physician to follow up on this.  _______________________________________________________  If you are age 61 or older, your body mass index should be between 23-30. Your Body mass index is 46.26 kg/m. If this is out of the aforementioned range listed, please consider follow up with your Primary Care Provider.  If you are age 61 or younger, your body mass index should be between 19-25. Your Body mass index is 46.26 kg/m. If this is out of the aformentioned range listed, please consider follow up with your Primary Care Provider.   ________________________________________________________  The [facility] providers would like to encourage you to use MYCHART to communicate with providers for non-urgent requests or questions.  Due to long hold times on the telephone, sending your provider a message by [facility] may be a faster and more efficient way to get a response.  Please allow 48 business hours for a response.  Please remember that this is for non-urgent requests.  _______________________________________________________  [facility] is using a team-based approach to care.  Your team is made up of your doctor and two to three APPS. Our APPS (Nurse Practitioners and Physician Assistants) work with your physician to ensure care continuity for you. They are fully qualified to address your health concerns and develop a treatment plan. They communicate directly with your gastroenterologist to care for you. Seeing the Advanced Practice Practitioners on your physician's team can help you by facilitating care more promptly, often allowing for earlier appointments, access to diagnostic testing, procedures, and other specialty referrals.   Due to recent changes in healthcare laws, you may see the  results of your imaging and laboratory studies on MyChart before your provider has had a chance to review them.  We understand that in some cases there may be results that are confusing or concerning to you. Not all laboratory results come back in the same time frame and the provider may be waiting for multiple results in order to interpret others.  Please give us  48 hours in order for your provider to thoroughly review all the results before contacting the office for clarification of your results.   Thank you for choosing me and [facility].  Dr. Wilhelmenia

## 2023-09-23 ENCOUNTER — Ambulatory Visit

## 2023-09-23 DIAGNOSIS — B029 Zoster without complications: Secondary | ICD-10-CM | POA: Diagnosis not present

## 2023-09-23 DIAGNOSIS — L821 Other seborrheic keratosis: Secondary | ICD-10-CM

## 2023-09-23 DIAGNOSIS — R21 Rash and other nonspecific skin eruption: Secondary | ICD-10-CM | POA: Diagnosis not present

## 2023-09-23 DIAGNOSIS — L918 Other hypertrophic disorders of the skin: Secondary | ICD-10-CM

## 2023-09-23 DIAGNOSIS — L910 Hypertrophic scar: Secondary | ICD-10-CM | POA: Diagnosis not present

## 2023-09-23 MED ORDER — TRIAMCINOLONE ACETONIDE 0.1 % EX OINT: TOPICAL_OINTMENT | CUTANEOUS | 2 refills | Status: AC

## 2023-09-23 NOTE — Patient Instructions (Addendum)
 Topical steroids (such as triamcinolone , fluocinolone , fluocinonide, mometasone, clobetasol , halobetasol, betamethasone, hydrocortisone) can cause thinning and lightening of the skin if they are used for too long in the same area. Your physician has selected the right strength medicine for your problem and area affected on the body. Please use your medication only as directed by your physician to prevent side effects.    Cryotherapy Aftercare  Wash gently with soap and water  everyday.   Apply Vaseline and Band-Aid daily until healed.     Due to recent changes in healthcare laws, you may see results of your pathology and/or laboratory studies on MyChart before the doctors have had a chance to review them. We understand that in some cases there may be results that are confusing or concerning to you. Please understand that not all results are received at the same time and often the doctors may need to interpret multiple results in order to provide you with the best plan of care or course of treatment. Therefore, we ask that you please give us  2 business days to thoroughly review all your results before contacting the office for clarification. Should we see a critical lab result, you will be contacted sooner.   If You Need Anything After Your Visit  If you have any questions or concerns for your doctor, please call our main line at [phone] and press option 4 to reach your doctor's medical assistant. If no one answers, please leave a voicemail as directed and we will return your call as soon as possible. Messages left after 4 pm will be answered the following business day.   You may also send us  a message via MyChart. We typically respond to MyChart messages within 1-2 business days.  For prescription refills, please ask your pharmacy to contact our office. Our fax number is [phone].  If you have an urgent issue when the clinic is closed that cannot wait until the next business day, you can  page your doctor at the number below.    Please note that while we do our best to be available for urgent issues outside of office hours, we are not available 24/7.   If you have an urgent issue and are unable to reach us , you may choose to seek medical care at your doctor's office, retail clinic, urgent care center, or emergency room.  If you have a medical emergency, please immediately call 911 or go to the emergency department.  Pager Numbers  - Dr. Hester: [phone]  - Dr. Jackquline: [phone]  - Dr. Claudene: [phone]   - Dr. Raymund: [phone]  In the event of inclement weather, please call our main line at [phone] for an update on the status of any delays or closures.  Dermatology Medication Tips: Please keep the boxes that topical medications come in in order to help keep track of the instructions about where and how to use these. Pharmacies typically print the medication instructions only on the boxes and not directly on the medication tubes.   If your medication is too expensive, please contact our office at [phone] option 4 or send us  a message through MyChart.   We are unable to tell what your co-pay for medications will be in advance as this is different depending on your insurance coverage. However, we may be able to find a substitute medication at lower cost or fill out paperwork to get insurance to cover a needed medication.   If a prior authorization is required to get your medication covered  by your insurance company, please allow us  1-2 business days to complete this process.  Drug prices often vary depending on where the prescription is filled and some pharmacies may offer cheaper prices.  The website www.goodrx.com contains coupons for medications through different pharmacies. The prices here do not account for what the cost may be with help from insurance (it may be cheaper with your insurance), but the website can give you the price if you did  not use any insurance.  - You can print the associated coupon and take it with your prescription to the pharmacy.  - You may also stop by our office during regular business hours and pick up a GoodRx coupon card.  - If you need your prescription sent electronically to a different pharmacy, notify our office through [facility] or by phone at [phone] option 4.     Si Usted Necesita Algo Despus de Su Visita  Tambin puede enviarnos un mensaje a travs de Clinical cytogeneticist. Por lo general respondemos a los mensajes de MyChart en el transcurso de 1 a 2 das hbiles.  Para renovar recetas, por favor pida a su farmacia que se ponga en contacto con nuestra oficina. Randi lakes de fax es [place] [phone].  Si tiene un asunto urgente cuando la clnica est cerrada y que no puede esperar hasta el siguiente da hbil, puede llamar/localizar a su doctor(a) al nmero que aparece a continuacin.   Por favor, tenga en cuenta que aunque hacemos todo lo posible para estar disponibles para asuntos urgentes fuera del horario de [place], no estamos disponibles las 24 horas del da, los 7 [address]  [address] de la [place].   Si tiene un problema urgente y no puede comunicarse con nosotros, puede optar por buscar atencin mdica  en el consultorio de su doctor(a), en una clnica privada, en un centro de atencin urgente o en una sala de emergencias.  Si tiene Engineer, drilling, por favor llame inmediatamente al 911 o vaya a la sala de emergencias.  Nmeros de bper  - Dr. Hester: [phone]  - Dra. Jackquline: [phone]  - Dr. Claudene: [phone]  - Dra. Kitts: [phone]  En caso de inclemencias del [place], por favor llame a nuestra lnea principal al [phone] para una actualizacin sobre el estado de cualquier retraso o cierre.  Consejos para la medicacin en dermatologa: Por favor, guarde las cajas en las que vienen los medicamentos de uso tpico para ayudarle a seguir las instrucciones sobre  dnde y cmo usarlos. Las farmacias generalmente imprimen las instrucciones del medicamento slo en las cajas y no directamente en los tubos del [place].   Si su medicamento es muy caro, por favor, pngase en contacto con landry rieger llamando al [phone] y presione la opcin 4 o envenos un mensaje a travs de Clinical cytogeneticist.   No podemos decirle cul ser su copago por los medicamentos por adelantado ya que esto es diferente dependiendo de la cobertura de su seguro. Sin embargo, es posible que podamos encontrar un medicamento sustituto a Audiological scientist un formulario para que el seguro cubra el medicamento que se considera necesario.   Si se requiere una autorizacin previa para que su compaa de seguros malta su medicamento, por favor permtanos de 1 a 2 das hbiles para completar este proceso.  Los precios de los medicamentos varan con frecuencia dependiendo del Environmental consultant de dnde se surte la receta y alguna farmacias pueden ofrecer precios ms baratos.  El sitio web www.goodrx.com tiene cupones para medicamentos de [employer] [facility]  farmacias. Los precios aqu no tienen en cuenta lo que podra costar con la ayuda del seguro (puede ser ms barato con su seguro), pero el sitio web puede darle el precio si no utiliz Tourist information centre manager.  - Puede imprimir el cupn correspondiente y llevarlo con su receta a la farmacia.  - Tambin puede pasar por nuestra oficina durante el horario de atencin regular y Education officer, museum una tarjeta de cupones de GoodRx.  - Si necesita que su receta se enve electrnicamente a una farmacia diferente, informe a nuestra oficina a travs de MyChart de [place] o por telfono llamando al [phone] y presione la opcin 4.

## 2023-09-23 NOTE — Progress Notes (Signed)
   New Patient Visit   Subjective  Claire Hansen is a 63 y.o. female who presents for the following: Patient has area of concern on her right anterior neck. No personal or known family hx of skin cancer. Patient also c/o rash all over her body for the past several months. Has been examined by PCP and they were unable to find a cause. Using otc hydrocortisone with some improvement.   The following portions of the chart were reviewed this encounter and updated as appropriate: medications, allergies, medical history  Review of Systems:  No other skin or systemic complaints except as noted in HPI or Assessment and Plan.  Objective  Well appearing patient in no apparent distress; mood and affect are within normal limits.    A focused examination was performed of the following areas: Neck, arms, trunk  Erythematous scaly plaque between breast, antecubital fossa  Keloidal plaque L mid back  Neck with several irritated skin tags   Relevant exam findings are noted in the Assessment and Plan.    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  - Offered biopsy, deferred for now  -start triamcinolone  ointment 0.1% twice daily to affected areas of skin Discussed side effect of potent topical steroids including atrophy, dyspigmentation, striae, telangectasia, folliculitis,  loss of skin pigment, hair growth, tachyphylaxis, risk of systemic absorption with missuse.   Topical steroids (such as triamcinolone , fluocinolone , fluocinonide, mometasone, clobetasol , halobetasol, betamethasone, hydrocortisone) can cause thinning and lightening of the skin if they are used for too long in the same area. Your physician has selected the right strength medicine for your problem and area affected on the body. Please use your medication only as directed by your physician to prevent side effects.     SKIN TAG R neck, L neck, mid neck Destruction of lesion - R neck, L neck, mid neck  Destruction method: cryotherapy   Informed consent: discussed and consent obtained   Lesion destroyed using liquid nitrogen: Yes   Region frozen until ice ball extended beyond lesion: Yes   Outcome: patient tolerated procedure well with no complications   Post-procedure details: wound care instructions given   Additional details:  Prior to procedure, discussed risks of blister formation, small wound, skin dyspigmentation, or rare scar following cryotherapy. Recommend Vaseline ointment to treated areas while healing.    Return in about 6 weeks (around 11/04/2023) for rash.  I, Emerick Ege, CMA am acting as scribe for Lauraine JAYSON Kanaris, MD.   Documentation: I have reviewed the above documentation for accuracy and completeness, and I agree with the above.  Lauraine JAYSON Kanaris, MD

## 2023-09-27 NOTE — Assessment & Plan Note (Signed)
 Blood pressure well controlled with current medications.  Continue current therapy.  Will reassess at follow up.   - CBC w/Diff - CMP w/eGFR

## 2023-09-27 NOTE — Assessment & Plan Note (Signed)
 Checking labs today.  Will continue supplements as needed.   - Vitamin D  - Vitamin B12

## 2023-09-27 NOTE — Assessment & Plan Note (Signed)
 Checking labs today. Will call pt. With results  Continue current diabetes POC, as patient has been well controlled on current regimen.  Will adjust meds if needed based on labs.   -CBC w/Diff -CMP w/eGFR -Hemoglobin A1C

## 2023-09-27 NOTE — Assessment & Plan Note (Signed)
 TSH still elevated.   She has been doing well with the alternated dosing.  Continue that until she sees her endocrinologist again.

## 2023-09-27 NOTE — Assessment & Plan Note (Signed)
 Checking labs today.  Continue current therapy for lipid control. Will modify as needed based on labwork results.   -CMP w/eGFR -Lipid Panel

## 2023-09-30 ENCOUNTER — Telehealth: Payer: Self-pay

## 2023-09-30 DIAGNOSIS — Z1211 Encounter for screening for malignant neoplasm of colon: Secondary | ICD-10-CM

## 2023-09-30 DIAGNOSIS — R0789 Other chest pain: Secondary | ICD-10-CM

## 2023-10-01 ENCOUNTER — Encounter: Payer: Self-pay | Admitting: Family

## 2023-10-04 ENCOUNTER — Other Ambulatory Visit: Payer: Self-pay

## 2023-10-04 MED ORDER — LEVOTHYROXINE SODIUM 150 MCG PO TABS: 150.0000 ug | ORAL_TABLET | Freq: Every day | ORAL | 1 refills | Status: DC

## 2023-10-04 NOTE — Telephone Encounter (Signed)
 Received clearance back from Dr.Shaukat Fernand stating that patient can hold plavix  x5 days and has been cleared from cardiac standpoint.

## 2023-10-08 ENCOUNTER — Encounter: Payer: Self-pay | Admitting: Family

## 2023-10-15 NOTE — Addendum Note (Signed)
 Addended byBETHA SUELLEN PEERS on: 10/15/2023 03:43 PM   Modules accepted: Orders

## 2023-10-15 NOTE — Telephone Encounter (Addendum)
 Patient has been scheduled for Colon/ EGD with [facility] on 11/26/23 with Dr.Danis. Left message for patient to return call.

## 2023-11-04 ENCOUNTER — Ambulatory Visit

## 2023-11-09 ENCOUNTER — Ambulatory Visit: Admitting: Cardiovascular Disease

## 2023-11-24 ENCOUNTER — Encounter: Payer: Self-pay | Admitting: Family

## 2023-11-24 ENCOUNTER — Ambulatory Visit: Admitting: Family

## 2023-11-24 VITALS — BP 156/86 | HR 76 | Ht 65.0 in | Wt 275.8 lb

## 2023-11-24 DIAGNOSIS — R5383 Other fatigue: Secondary | ICD-10-CM | POA: Insufficient documentation

## 2023-11-24 DIAGNOSIS — R0789 Other chest pain: Secondary | ICD-10-CM | POA: Insufficient documentation

## 2023-11-24 DIAGNOSIS — E538 Deficiency of other specified B group vitamins: Secondary | ICD-10-CM

## 2023-11-24 DIAGNOSIS — H60312 Diffuse otitis externa, left ear: Secondary | ICD-10-CM | POA: Insufficient documentation

## 2023-11-24 DIAGNOSIS — E559 Vitamin D deficiency, unspecified: Secondary | ICD-10-CM

## 2023-11-24 DIAGNOSIS — E1122 Type 2 diabetes mellitus with diabetic chronic kidney disease: Secondary | ICD-10-CM | POA: Diagnosis not present

## 2023-11-24 DIAGNOSIS — E782 Mixed hyperlipidemia: Secondary | ICD-10-CM

## 2023-11-24 DIAGNOSIS — R946 Abnormal results of thyroid function studies: Secondary | ICD-10-CM | POA: Insufficient documentation

## 2023-11-24 DIAGNOSIS — Z6841 Body Mass Index (BMI) 40.0 and over, adult: Secondary | ICD-10-CM

## 2023-11-24 DIAGNOSIS — I10 Essential (primary) hypertension: Secondary | ICD-10-CM | POA: Diagnosis not present

## 2023-11-24 LAB — POCT CBG (FASTING - GLUCOSE)-MANUAL ENTRY: Glucose Fasting, POC: 126 mg/dL — AB (ref 70–99)

## 2023-11-24 MED ORDER — OFLOXACIN 0.3 % OT SOLN: 5.0000 [drp] | Freq: Every day | OTIC | 0 refills | Status: AC

## 2023-11-24 MED ORDER — ACETIC ACID 2 % OT SOLN: 4.0000 [drp] | Freq: Two times a day (BID) | OTIC | 0 refills | Status: AC

## 2023-11-24 NOTE — Assessment & Plan Note (Signed)
-   EKG - check labs today

## 2023-11-24 NOTE — Assessment & Plan Note (Signed)
-   Check labs today - Supplementation recommended based off lab results and will notify patient at that time  - Second opinion Endocrinologist referral sent to [facility] taking levothyroxine  125 mcg every other day; take levothyroxine  150 mcg on alternating days -MRI ordered to evaluate for possible pituitary issues.

## 2023-11-24 NOTE — Assessment & Plan Note (Signed)
-   Continue healthy diet and exercise as tolerated. - Continue medications as prescribed. - Check labs today

## 2023-11-24 NOTE — Assessment & Plan Note (Signed)
-   Start Floxin  ear drops for 7 days.  - Start acetic acid drops after completing antibiotic ear drops

## 2023-11-24 NOTE — Progress Notes (Unsigned)
 Established Patient Office Visit  Subjective:  Patient ID: Claire Hansen, female    DOB: [birth year]  Age: 63 y.o. MRN: [id number]  Chief Complaint  Patient presents with   Follow-up    3 month follow up    Patient is here today for her 3 months follow up.  She has been feeling poorly since last appointment.   She does have additional concerns to discuss today. Thinks she has an left ear infection. Endorses fatigue and decreased endurance with ambulation.  Patient reports endocrinology wants her to increase levothyroxine  to 175 mcg daily. She had chest pain and palpitations with correcting her TSH too quickly. She reports she has been doing 150 mcg daily until she got her blood work checked today. Patient has hx of parathyroid  issues and large goiter that has been removed. Will do EKG today. Send referral to second opinion Endocrinologist. At this time due to patients chest pain we will reduce levothyroxine  dose to alternating 125 mcg with 150 mcg every other day. Will also order brain MRI to monitor pituitary gland due to patients continued abnormal TSH and hx of PTH abnormalities as well. Labs are due today.  She needs refills.   I have reviewed her active problem list, medication list, allergies, family history, social history, health maintenance, notes from last encounter, lab results for her appointment today.    Patient educated if she has changes to her current chest pain such as worsening symptoms, shortness of breath, or new symptoms arise then seek emergency treatment at local ED.     No other concerns at this time.   Past Medical History:  Diagnosis Date   Abnormal results of thyroid  function studies 03/04/2022   Abnormal results of thyroid  function studies 03/04/2022   Acute CVA (cerebrovascular accident) (HCC) 06/09/2021   Allergy    Anxiety    Asthma    pt denies having asthma   Chronic osteoarthritis    knees   Deviated septum    Diabetes mellitus type 2,  uncomplicated (HCC) 03/04/2022   Diabetes mellitus without complication (HCC)    Fever postop 03/04/2022   History of asthma 08/13/2014   Hyperlipidemia    Hypertension    Irregular menstrual cycle    Leg pain, anterior, left 03/22/2020   Lymphadenopathy    Obesity    Peri-menopause    Pneumonia 2015   Post-operative state 01/10/2021   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    Past Surgical History:  Procedure Laterality Date   BACK SURGERY  07/2020   BREAST SURGERY Right    Cyst removed   CESAREAN SECTION     x2   CHOLECYSTECTOMY     CYSTOSCOPY  01/10/2021   Procedure: CYSTOSCOPY;  Surgeon: Schermerhorn, Debby PARAS, MD;  Location: [facility];  Service: Gynecology;;   DILATION AND CURETTAGE OF UTERUS     Treatment of Metorrhagia and Endometrial Ablation   JOINT REPLACEMENT     LEFT HEART CATH AND CORONARY ANGIOGRAPHY N/A 09/01/2023   Procedure: LEFT HEART CATH AND CORONARY ANGIOGRAPHY;  Surgeon: Katina Albright, MD;  Location: [facility];  Service: Cardiovascular;  Laterality: N/A;   PERONEAL NERVE DECOMPRESSION Left 04/29/2020   Procedure: PERONEAL NERVE DECOMPRESSION;  Surgeon: Bluford Standing, MD;  Location: [facility];  Service: Neurosurgery;  Laterality: Left;   SUPRACERVICAL ABDOMINAL HYSTERECTOMY Bilateral 01/10/2021   Procedure: HYSTERECTOMY ABDOMINAL WITH  BILATERA SALPINGO OOPHORECTOMY;  Surgeon: Schermerhorn, Debby PARAS, MD;  Location: [facility];  Service: Gynecology;  Laterality: Bilateral;   Thyroid   04/05/2023   TONSILLECTOMY     TOTAL HIP ARTHROPLASTY Right 11/05/2016   Procedure: TOTAL HIP ARTHROPLASTY ANTERIOR APPROACH;  Surgeon: Kathlynn Sharper, MD;  Location: [facility];  Service: Orthopedics;  Laterality: Right;   TOTAL HIP ARTHROPLASTY Left 07/01/2017   Procedure: TOTAL HIP ARTHROPLASTY ANTERIOR APPROACH;  Surgeon: Kathlynn Sharper, MD;  Location: [facility];  Service: Orthopedics;  Laterality: Left;   TOTAL HIP REVISION Left 05/03/2020   Procedure: LEFT FEMORAL COMPONENT  REVISION;  Surgeon: Kathlynn Sharper, MD;  Location: [facility];  Service: Orthopedics;  Laterality: Left;    Social History   Socioeconomic History   Marital status: Married    Spouse name: Not on file   Number of children: 2   Years of education: Not on file   Highest education level: Not on file  Occupational History   Occupation: retired  Tobacco Use   Smoking status: Never   Smokeless tobacco: Never  Vaping Use   Vaping status: Never Used  Substance and Sexual Activity   Alcohol use: No    Alcohol/week: 0.0 standard drinks of alcohol   Drug use: No   Sexual activity: Yes    Partners: Male  Other Topics Concern   Not on file  Social History Narrative   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    1.5 yrs college    Caffeine-    Exercises 3 x weekly    Social Drivers of Health   Financial Resource Strain: Not on file  Food Insecurity: No Food Insecurity (09/01/2023)   Hunger Vital Sign    Worried About Running Out of Food in the Last Year: Never true    Ran Out of Food in the Last Year: Never true  Transportation Needs: No Transportation Needs (09/01/2023)   PRAPARE - Administrator, Civil Service (Medical): No    Lack of Transportation (Non-Medical): No  Physical Activity: Not on file  Stress: Not on file  Social Connections: Not on file  Intimate Partner Violence: Not At Risk (09/01/2023)   Humiliation, Afraid, Rape, and Kick questionnaire    Fear of Current or Ex-Partner: No    Emotionally Abused: No    Physically Abused: No    Sexually Abused: No    Family History  Problem Relation Age of Onset   Asthma Son     Allergies  Allergen Reactions   Shellfish Allergy Anaphylaxis and Nausea And Vomiting    Topical betadine  is OK to use.   Statins Other (See Comments)    Muscle pain/aches, can tolerate rosuvastatin     Review of Systems  Constitutional:  Positive for malaise/fatigue.  HENT:  Positive for ear pain (left ear) and tinnitus (left ear).    Eyes:  Negative for blurred vision and pain.  Respiratory:  Negative for cough and shortness of breath.   Cardiovascular:  Positive for chest pain (left side that travels down left arm). Negative for palpitations, claudication and leg swelling.  Gastrointestinal:  Negative for abdominal pain, blood in stool, constipation, diarrhea, nausea and vomiting.  Genitourinary:  Negative for dysuria, frequency and urgency.  Musculoskeletal: Negative.   Skin: Negative.   Neurological:  Negative for dizziness, tingling, sensory change and headaches.  Endo/Heme/Allergies: Negative.   Psychiatric/Behavioral: Negative.         Objective:   BP (!) 156/86   Pulse 76   Ht 5' 5 (1.651 m)   Wt 275 lb 12.8 oz (125.1  kg)   LMP 06/04/2016   SpO2 99%   BMI 45.90 kg/m   Vitals:   11/24/23 0916  BP: (!) 156/86  Pulse: 76  Height: 5' 5 (1.651 m)  Weight: 275 lb 12.8 oz (125.1 kg)  SpO2: 99%  BMI (Calculated): 45.9    Physical Exam Vitals and nursing note reviewed.  Constitutional:      Appearance: Normal appearance.  HENT:     Head: Normocephalic.     Right Ear: Tympanic membrane and ear canal normal.     Left Ear: Tympanic membrane normal. Decreased hearing noted. Swelling and tenderness present.  Eyes:     Extraocular Movements: Extraocular movements intact.     Pupils: Pupils are equal, round, and reactive to light.  Cardiovascular:     Rate and Rhythm: Normal rate and regular rhythm.     Pulses: Normal pulses.     Heart sounds: Normal heart sounds. No murmur heard. Pulmonary:     Effort: Pulmonary effort is normal. No respiratory distress.     Breath sounds: Normal breath sounds.  Abdominal:     General: There is no distension.     Tenderness: There is no abdominal tenderness.  Musculoskeletal:        General: No tenderness. Normal range of motion.     Cervical back: Normal range of motion and neck supple.     Right lower leg: No edema.     Left lower leg: No edema.  Skin:     General: Skin is warm and dry.     Coloration: Skin is not jaundiced.     Findings: No erythema.  Neurological:     General: No focal deficit present.     Mental Status: She is alert and oriented to person, place, and time.  Psychiatric:        Mood and Affect: Mood normal.        Speech: Speech normal.        Behavior: Behavior is cooperative.        Cognition and Memory: Memory is not impaired.      Results for orders placed or performed in visit on 11/24/23  POCT CBG (Fasting - Glucose)  Result Value Ref Range   Glucose Fasting, POC 126 (A) 70 - 99 mg/dL    Recent Results (from the past 2160 hours)  Basic metabolic panel     Status: Abnormal   Collection Time: 09/01/23  7:58 AM  Result Value Ref Range   Sodium 138 135 - 145 mmol/L   Potassium 4.0 3.5 - 5.1 mmol/L   Chloride 102 98 - 111 mmol/L   CO2 25 22 - 32 mmol/L   Glucose, Bld 165 (H) 70 - 99 mg/dL    Comment: Glucose reference range applies only to samples taken after fasting for at least 8 hours.   BUN 21 8 - 23 mg/dL   Creatinine, Ser 8.62 (H) 0.44 - 1.00 mg/dL   Calcium  10.0 8.9 - 10.3 mg/dL   GFR, Estimated 44 (L) >60 mL/min    Comment: (NOTE) Calculated using the CKD-EPI Creatinine Equation (2021)    Anion gap 11 5 - 15    Comment: Performed at [facility], [address] [place], [place] [address]  CBC     Status: Abnormal   Collection Time: 09/01/23  7:58 AM  Result Value Ref Range   WBC 9.1 4.0 - 10.5 K/uL   RBC 5.28 (H) 3.87 - 5.11 MIL/uL   Hemoglobin 15.4 (  H) 12.0 - 15.0 g/dL   HCT 54.5 63.9 - 53.9 %   MCV 86.0 80.0 - 100.0 fL   MCH 29.2 26.0 - 34.0 pg   MCHC 33.9 30.0 - 36.0 g/dL   RDW 85.2 88.4 - 84.4 %   Platelets 312 150 - 400 K/uL   nRBC 0.0 0.0 - 0.2 %    Comment: Performed at [facility], [address] [place], [place] [address]  Troponin I (High Sensitivity)     Status: Abnormal   Collection Time: 09/01/23  7:58 AM  Result Value Ref Range   Troponin I (High  Sensitivity) 152 (HH) <18 ng/L    Comment: CRITICAL RESULT CALLED TO, READ BACK BY AND VERIFIED WITH CHERON KNOWLES 09/01/23 0931 KLW (NOTE) Elevated high sensitivity troponin I (hsTnI) values and significant  changes across serial measurements may suggest ACS but many other  chronic and acute conditions are known to elevate hsTnI results.  Refer to the Links section for chest pain algorithms and additional  guidance. Performed at [facility], [address] [place], [place] [address]   Troponin I (High Sensitivity)     Status: Abnormal   Collection Time: 09/01/23 10:11 AM  Result Value Ref Range   Troponin I (High Sensitivity) 160 (HH) <18 ng/L    Comment: CRITICAL VALUE NOTED. VALUE IS CONSISTENT WITH PREVIOUSLY REPORTED/CALLED VALUE KLW (NOTE) Elevated high sensitivity troponin I (hsTnI) values and significant  changes across serial measurements may suggest ACS but many other  chronic and acute conditions are known to elevate hsTnI results.  Refer to the Links section for chest pain algorithms and additional  guidance. Performed at [facility], [address] [place], [place] [address]   APTT     Status: None   Collection Time: 09/01/23 10:11 AM  Result Value Ref Range   aPTT 31 24 - 36 seconds    Comment: Performed at [facility], [address] [place], [place] [address]  Protime-INR     Status: None   Collection Time: 09/01/23 10:11 AM  Result Value Ref Range   Prothrombin Time 13.9 11.4 - 15.2 seconds   INR 1.0 0.8 - 1.2    Comment: (NOTE) INR goal varies based on device and disease states. Performed at [facility], [address] [place], [place] [address]   Glucose, capillary     Status: Abnormal   Collection Time: 09/01/23  1:50 PM  Result Value Ref Range   Glucose-Capillary 138 (H) 70 - 99 mg/dL    Comment: Glucose reference range applies only to samples taken after fasting for at least 8 hours.  HIV Antibody  (routine testing w rflx)     Status: None   Collection Time: 09/01/23  7:02 PM  Result Value Ref Range   HIV Screen 4th Generation wRfx Non Reactive Non Reactive    Comment: Performed at [facility], [address], [place], [place] [address]  Glucose, capillary     Status: Abnormal   Collection Time: 09/01/23  8:59 PM  Result Value Ref Range   Glucose-Capillary 172 (H) 70 - 99 mg/dL    Comment: Glucose reference range applies only to samples taken after fasting for at least 8 hours.  ECHOCARDIOGRAM COMPLETE     Status: None   Collection Time: 09/01/23  9:24 PM  Result Value Ref Range   Weight 4,316.78 oz   Height 65 in   BP 194/94 mmHg   S' Lateral  2.40 cm   Area-P 1/2 3.62 cm2   Est EF 65 - 70%   CBC     Status: Abnormal   Collection Time: 09/02/23  4:43 AM  Result Value Ref Range   WBC 12.2 (H) 4.0 - 10.5 K/uL   RBC 4.69 3.87 - 5.11 MIL/uL   Hemoglobin 13.8 12.0 - 15.0 g/dL   HCT 59.1 63.9 - 53.9 %   MCV 87.0 80.0 - 100.0 fL   MCH 29.4 26.0 - 34.0 pg   MCHC 33.8 30.0 - 36.0 g/dL   RDW 84.9 88.4 - 84.4 %   Platelets 271 150 - 400 K/uL   nRBC 0.0 0.0 - 0.2 %    Comment: Performed at [facility], [address] [place], [place] [address]  Lipoprotein A (LPA)     Status: None   Collection Time: 09/02/23  4:43 AM  Result Value Ref Range   Lipoprotein (a) 28.0 <75.0 nmol/L    Comment: (NOTE) This test was developed and its performance characteristics determined by [facility]. It has not been cleared or approved by the Food and Drug Administration. Note:  Values greater than or equal to 75.0 nmol/L may       indicate an independent risk factor for CHD,       but must be evaluated with caution when applied       to non-Caucasian populations due to the       influence of genetic factors on Lp(a) across       ethnicities. Performed At: [facility] [address] [place], [place] [id number] Jennette Shorter MD Ey:[id number]   Basic metabolic panel  with GFR     Status: Abnormal   Collection Time: 09/02/23  4:43 AM  Result Value Ref Range   Sodium 138 135 - 145 mmol/L   Potassium 3.6 3.5 - 5.1 mmol/L   Chloride 102 98 - 111 mmol/L   CO2 26 22 - 32 mmol/L   Glucose, Bld 219 (H) 70 - 99 mg/dL    Comment: Glucose reference range applies only to samples taken after fasting for at least 8 hours.   BUN 20 8 - 23 mg/dL   Creatinine, Ser 8.55 (H) 0.44 - 1.00 mg/dL   Calcium  9.5 8.9 - 10.3 mg/dL   GFR, Estimated 41 (L) >60 mL/min    Comment: (NOTE) Calculated using the CKD-EPI Creatinine Equation (2021)    Anion gap 10 5 - 15    Comment: Performed at [facility], [address] [place], [place] [address]  Glucose, capillary     Status: Abnormal   Collection Time: 09/02/23  7:19 AM  Result Value Ref Range   Glucose-Capillary 206 (H) 70 - 99 mg/dL    Comment: Glucose reference range applies only to samples taken after fasting for at least 8 hours.  Glucose, capillary     Status: Abnormal   Collection Time: 09/02/23 11:38 AM  Result Value Ref Range   Glucose-Capillary 203 (H) 70 - 99 mg/dL    Comment: Glucose reference range applies only to samples taken after fasting for at least 8 hours.  POCT CBG (Fasting - Glucose)     Status: Abnormal   Collection Time: 11/24/23  9:23 AM  Result Value Ref Range   Glucose Fasting, POC 126 (A) 70 - 99 mg/dL       Assessment & Plan:   Assessment & Plan Type 2 diabetes mellitus with chronic kidney disease, without long-term current use of insulin ,  unspecified CKD stage (HCC) Essential hypertension BMI 45.0-49.9, adult (HCC) Mixed hyperlipidemia - Continue healthy diet and exercise as tolerated. - Continue medications as prescribed. - Check labs today  Vitamin D  deficiency, unspecified B12 deficiency due to diet Other fatigue Abnormal results of thyroid  function studies - Check labs today - Supplementation recommended based off lab results and will notify patient at that time   - Second opinion Endocrinologist referral sent to [facility] taking levothyroxine  125 mcg every other day; take levothyroxine  150 mcg on alternating days -MRI ordered to evaluate for possible pituitary issues. Other chest pain - EKG - check labs today Acute diffuse otitis externa of left ear - Start Floxin  ear drops for 7 days.  - Start acetic acid drops after completing antibiotic ear drops    Return in about 2 weeks (around 12/08/2023).   Total time spent: 35 minutes  Oddis DELENA Cain, FNP  11/24/2023   This document may have been prepared by [facility] Voice Recognition software and as such may include unintentional dictation errors.

## 2023-11-24 NOTE — Patient Instructions (Addendum)
 MRI ordered - they will call you to schedule these.   Labs today - will call you with results if they are grossly abnormal.  Otherwise, we'll review them in detail at your follow up appointment.  Sending referral - they will call you - contact info is below:   Dr. Maudry -  [facility]  Address: [address] [place] [address], [place], [place] [address] Phone: [phone]   Take Thyroid  meds as we discussed today.   Ear drops sent - take ofloxacin  first, then once done, can start acetic acid drops for itching.

## 2023-11-25 ENCOUNTER — Encounter: Payer: Self-pay | Admitting: Family

## 2023-11-25 LAB — VITAMIN B12: Vitamin B-12: 529 pg/mL (ref 232–1245)

## 2023-11-25 LAB — LIPID PANEL
Chol/HDL Ratio: 6.1 ratio — ABNORMAL HIGH (ref 0.0–4.4)
Cholesterol, Total: 273 mg/dL — ABNORMAL HIGH (ref 100–199)
HDL: 45 mg/dL (ref >39)
LDL Chol Calc (NIH): 175 mg/dL — ABNORMAL HIGH (ref 0–99)
Triglycerides: 278 mg/dL — ABNORMAL HIGH (ref 0–149)
VLDL Cholesterol Cal: 53 mg/dL — ABNORMAL HIGH (ref 5–40)

## 2023-11-25 LAB — CBC WITH DIFFERENTIAL/PLATELET
Basophils Absolute: 0.1 x10E3/uL (ref 0.0–0.2)
Basos: 1 %
EOS (ABSOLUTE): 0.1 x10E3/uL (ref 0.0–0.4)
Eos: 1 %
Hematocrit: 48.4 % — ABNORMAL HIGH (ref 34.0–46.6)
Hemoglobin: 16 g/dL — ABNORMAL HIGH (ref 11.1–15.9)
Immature Grans (Abs): 0 x10E3/uL (ref 0.0–0.1)
Immature Granulocytes: 0 %
Lymphocytes Absolute: 1.8 x10E3/uL (ref 0.7–3.1)
Lymphs: 26 %
MCH: 33.4 pg — ABNORMAL HIGH (ref 26.6–33.0)
MCHC: 33.1 g/dL (ref 31.5–35.7)
MCV: 101 fL — ABNORMAL HIGH (ref 79–97)
Monocytes Absolute: 0.5 x10E3/uL (ref 0.1–0.9)
Monocytes: 8 %
Neutrophils Absolute: 4.4 x10E3/uL (ref 1.4–7.0)
Neutrophils: 64 %
Platelets: 203 x10E3/uL (ref 150–450)
RBC: 4.79 x10E6/uL (ref 3.77–5.28)
RDW: 12.6 % (ref 11.7–15.4)
WBC: 6.8 x10E3/uL (ref 3.4–10.8)

## 2023-11-25 LAB — INSULIN-LIKE GROWTH FACTOR: Insulin-Like GF-1: 121 ng/mL (ref 57–202)

## 2023-11-25 LAB — CMP14+EGFR
ALT: 63 IU/L — ABNORMAL HIGH (ref 0–32)
AST: 50 IU/L — ABNORMAL HIGH (ref 0–40)
Albumin: 4.9 g/dL (ref 3.9–4.9)
Alkaline Phosphatase: 94 IU/L (ref 49–135)
BUN/Creatinine Ratio: 13 (ref 12–28)
BUN: 17 mg/dL (ref 8–27)
Bilirubin Total: 0.3 mg/dL (ref 0.0–1.2)
CO2: 23 mmol/L (ref 20–29)
Calcium: 10 mg/dL (ref 8.7–10.3)
Chloride: 100 mmol/L (ref 96–106)
Creatinine, Ser: 1.26 mg/dL — ABNORMAL HIGH (ref 0.57–1.00)
Globulin, Total: 3.2 g/dL (ref 1.5–4.5)
Glucose: 118 mg/dL — ABNORMAL HIGH (ref 70–99)
Potassium: 4.1 mmol/L (ref 3.5–5.2)
Sodium: 140 mmol/L (ref 134–144)
Total Protein: 8.1 g/dL (ref 6.0–8.5)
eGFR: 49 mL/min/1.73 — ABNORMAL LOW (ref >59)

## 2023-11-25 LAB — VITAMIN D 25 HYDROXY (VIT D DEFICIENCY, FRACTURES): Vit D, 25-Hydroxy: 21.5 ng/mL — ABNORMAL LOW (ref 30.0–100.0)

## 2023-11-25 LAB — TSH+T4F+T3FREE
Free T4: 1.12 ng/dL (ref 0.82–1.77)
T3, Free: 2.1 pg/mL (ref 2.0–4.4)
TSH: 12 u[IU]/mL — ABNORMAL HIGH (ref 0.450–4.500)

## 2023-11-25 LAB — ESTRADIOL: Estradiol: <5 pg/mL (ref 0.0–54.7)

## 2023-11-25 LAB — IRON,TIBC AND FERRITIN PANEL
Ferritin: 185 ng/mL — ABNORMAL HIGH (ref 15–150)
Iron Saturation: 18 % (ref 15–55)
Iron: 62 ug/dL (ref 27–139)
Total Iron Binding Capacity: 341 ug/dL (ref 250–450)
UIBC: 279 ug/dL (ref 118–369)

## 2023-11-25 LAB — FSH/LH
FSH: 42.3 m[IU]/mL (ref 25.8–134.8)
LH: 23.6 m[IU]/mL (ref 7.7–58.5)

## 2023-11-25 LAB — PTH, INTACT AND CALCIUM: PTH: 69 pg/mL — ABNORMAL HIGH (ref 15–65)

## 2023-11-25 LAB — T3: T3, Total: 88 ng/dL (ref 71–180)

## 2023-11-25 LAB — TROPONIN T: Troponin T (Highly Sensitive): 36 ng/L (ref 0–14)

## 2023-11-25 LAB — HEMOGLOBIN A1C
Est. average glucose Bld gHb Est-mCnc: 255 mg/dL
Hgb A1c MFr Bld: 10.5 % — ABNORMAL HIGH (ref 4.8–5.6)

## 2023-11-25 LAB — BRAIN NATRIURETIC PEPTIDE: BNP: 54 pg/mL (ref 0.0–100.0)

## 2023-11-25 LAB — PROLACTIN: Prolactin: 6.2 ng/mL (ref 3.6–25.2)

## 2023-11-26 ENCOUNTER — Encounter

## 2023-11-26 ENCOUNTER — Encounter: Admitting: Gastroenterology

## 2023-11-28 ENCOUNTER — Other Ambulatory Visit: Payer: Self-pay

## 2023-11-29 ENCOUNTER — Other Ambulatory Visit: Payer: Self-pay

## 2023-12-08 ENCOUNTER — Other Ambulatory Visit: Payer: Self-pay | Admitting: Family

## 2023-12-13 ENCOUNTER — Ambulatory Visit: Admitting: Family

## 2024-01-08 ENCOUNTER — Other Ambulatory Visit: Payer: Self-pay | Admitting: Family

## 2024-01-09 ENCOUNTER — Encounter: Payer: Self-pay | Admitting: Family

## 2024-01-11 ENCOUNTER — Encounter: Payer: Self-pay | Admitting: Family

## 2024-01-13 ENCOUNTER — Ambulatory Visit: Payer: Self-pay

## 2024-01-13 NOTE — Telephone Encounter (Signed)
 Noted,paper placed at the front

## 2024-01-25 ENCOUNTER — Other Ambulatory Visit: Payer: Self-pay | Admitting: Family

## 2024-01-25 DIAGNOSIS — E1122 Type 2 diabetes mellitus with diabetic chronic kidney disease: Secondary | ICD-10-CM

## 2024-01-27 ENCOUNTER — Other Ambulatory Visit: Payer: Self-pay | Admitting: Family

## 2024-02-04 ENCOUNTER — Ambulatory Visit: Admitting: Family

## 2024-02-04 VITALS — BP 130/74 | Ht 65.0 in | Wt 281.0 lb

## 2024-02-04 DIAGNOSIS — I10 Essential (primary) hypertension: Secondary | ICD-10-CM

## 2024-02-04 DIAGNOSIS — E782 Mixed hyperlipidemia: Secondary | ICD-10-CM

## 2024-02-04 DIAGNOSIS — R946 Abnormal results of thyroid function studies: Secondary | ICD-10-CM | POA: Diagnosis not present

## 2024-02-04 DIAGNOSIS — E1122 Type 2 diabetes mellitus with diabetic chronic kidney disease: Secondary | ICD-10-CM | POA: Diagnosis not present

## 2024-02-04 DIAGNOSIS — N644 Mastodynia: Secondary | ICD-10-CM

## 2024-02-04 DIAGNOSIS — E538 Deficiency of other specified B group vitamins: Secondary | ICD-10-CM

## 2024-02-04 LAB — POCT CBG (FASTING - GLUCOSE)-MANUAL ENTRY: Glucose Fasting, POC: 140 mg/dL — AB (ref 70–99)

## 2024-02-04 NOTE — Progress Notes (Unsigned)
 "  Established Patient Office Visit  Subjective:  Patient ID: Claire Hansen, female    DOB: [birth year]  Age: 64 y.o. MRN: [id number]  Chief Complaint  Patient presents with   Follow-up    3 month room 4    Patient is here today for her 3 months follow up.  She has been feeling fairly well since last appointment.   She does have additional concerns to discuss today.  {apptlabs:33170} She {Needs / does not need:13302} refills.   I have reviewed her {Reviewed:14835} for her appointment today.      No other concerns at this time.   Past Medical History:  Diagnosis Date   Abnormal results of thyroid  function studies 03/04/2022   Abnormal results of thyroid  function studies 03/04/2022   Acute CVA (cerebrovascular accident) (HCC) 06/09/2021   Allergy    Anxiety    Asthma    pt denies having asthma   Chronic osteoarthritis    knees   Deviated septum    Diabetes mellitus type 2, uncomplicated (HCC) 03/04/2022   Diabetes mellitus without complication (HCC)    Fever postop 03/04/2022   History of asthma 08/13/2014   Hyperlipidemia    Hypertension    Irregular menstrual cycle    Leg pain, anterior, left 03/22/2020   Lymphadenopathy    Obesity    Peri-menopause    Pneumonia 2015   Post-operative state 01/10/2021   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    Past Surgical History:  Procedure Laterality Date   BACK SURGERY  07/2020   BREAST SURGERY Right    Cyst removed   CESAREAN SECTION     x2   CHOLECYSTECTOMY     CYSTOSCOPY  01/10/2021   Procedure: CYSTOSCOPY;  Surgeon: Schermerhorn, Debby PARAS, MD;  Location: [facility];  Service: Gynecology;;   DILATION AND CURETTAGE OF UTERUS     Treatment of Metorrhagia and Endometrial Ablation   JOINT REPLACEMENT     LEFT HEART CATH AND CORONARY ANGIOGRAPHY N/A 09/01/2023   Procedure: LEFT HEART CATH AND CORONARY ANGIOGRAPHY;  Surgeon: Katina Albright, MD;  Location: [facility];  Service:  Cardiovascular;  Laterality: N/A;   PERONEAL NERVE DECOMPRESSION Left 04/29/2020   Procedure: PERONEAL NERVE DECOMPRESSION;  Surgeon: Bluford Standing, MD;  Location: [facility];  Service: Neurosurgery;  Laterality: Left;   SUPRACERVICAL ABDOMINAL HYSTERECTOMY Bilateral 01/10/2021   Procedure: HYSTERECTOMY ABDOMINAL WITH  BILATERA SALPINGO OOPHORECTOMY;  Surgeon: Schermerhorn, Debby PARAS, MD;  Location: [facility];  Service: Gynecology;  Laterality: Bilateral;   Thyroid   04/05/2023   TONSILLECTOMY     TOTAL HIP ARTHROPLASTY Right 11/05/2016   Procedure: TOTAL HIP ARTHROPLASTY ANTERIOR APPROACH;  Surgeon: Kathlynn Sharper, MD;  Location: [facility];  Service: Orthopedics;  Laterality: Right;   TOTAL HIP ARTHROPLASTY Left 07/01/2017   Procedure: TOTAL HIP ARTHROPLASTY ANTERIOR APPROACH;  Surgeon: Kathlynn Sharper, MD;  Location: [facility];  Service: Orthopedics;  Laterality: Left;   TOTAL HIP REVISION Left 05/03/2020   Procedure: LEFT FEMORAL COMPONENT REVISION;  Surgeon: Kathlynn Sharper, MD;  Location: [facility];  Service: Orthopedics;  Laterality: Left;    Social History   Socioeconomic History   Marital status: Married    Spouse name: Not on file   Number of children: 2   Years of education: Not on file   Highest education level: Not on file  Occupational History   Occupation: retired  Tobacco Use   Smoking status: Never   Smokeless tobacco: Never  Vaping Use  Vaping status: Never Used  Substance and Sexual Activity   Alcohol use: No    Alcohol/week: 0.0 standard drinks of alcohol   Drug use: No   Sexual activity: Yes    Partners: Male  Other Topics Concern   Not on file  Social History Narrative   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    1.5 yrs college    Caffeine-    Exercises 3 x weekly    Social Drivers of Health   Tobacco Use: Low Risk (11/25/2023)   Patient History    Smoking Tobacco Use: Never    Smokeless Tobacco Use: Never    Passive Exposure: Not on  file  Financial Resource Strain: Not on file  Food Insecurity: No Food Insecurity (09/01/2023)   Epic    Worried About Programme Researcher, Broadcasting/film/video in the Last Year: Never true    Ran Out of Food in the Last Year: Never true  Transportation Needs: No Transportation Needs (09/01/2023)   Epic    Lack of Transportation (Medical): No    Lack of Transportation (Non-Medical): No  Physical Activity: Not on file  Stress: Not on file  Social Connections: Not on file  Intimate Partner Violence: Not At Risk (09/01/2023)   Epic    Fear of Current or Ex-Partner: No    Emotionally Abused: No    Physically Abused: No    Sexually Abused: No  Depression (PHQ2-9): Low Risk (05/25/2023)   Depression (PHQ2-9)    PHQ-2 Score: 0  Alcohol Screen: Not on file  Housing: Low Risk (09/01/2023)   Epic    Unable to Pay for Housing in the Last Year: No    Number of Times Moved in the Last Year: 0    Homeless in the Last Year: No  Utilities: Not At Risk (09/01/2023)   Epic    Threatened with loss of utilities: No  Health Literacy: Not on file    Family History  Problem Relation Age of Onset   Asthma Son     Allergies[1]  Review of Systems  All other systems reviewed and are negative.      Objective:   Wt 281 lb (127.5 kg)   LMP 06/04/2016   BMI 46.76 kg/m   Vitals:   02/04/24 1004  Weight: 281 lb (127.5 kg)    Physical Exam Vitals and nursing note reviewed.  Constitutional:      Appearance: Normal appearance. She is normal weight.  HENT:     Head: Normocephalic.  Eyes:     Extraocular Movements: Extraocular movements intact.     Conjunctiva/sclera: Conjunctivae normal.     Pupils: Pupils are equal, round, and reactive to light.  Cardiovascular:     Rate and Rhythm: Normal rate.  Pulmonary:     Effort: Pulmonary effort is normal.  Neurological:     General: No focal deficit present.     Mental Status: She is alert and oriented to person, place, and time. Mental status is at  baseline.  Psychiatric:        Mood and Affect: Mood normal.        Behavior: Behavior normal.        Thought Content: Thought content normal.      No results found for any visits on 02/04/24.  Recent Results (from the past 2160 hours)  POCT CBG (Fasting - Glucose)     Status: Abnormal   Collection Time: 11/24/23  9:23 AM  Result Value  Ref Range   Glucose Fasting, POC 126 (A) 70 - 99 mg/dL  [id number]     Status: Abnormal   Collection Time: 11/24/23 10:39 AM  Result Value Ref Range   Glucose 118 (H) 70 - 99 mg/dL   BUN 17 8 - 27 mg/dL   Creatinine, Ser 8.73 (H) 0.57 - 1.00 mg/dL   eGFR 49 (L) >40 fO/fpw/8.26   BUN/Creatinine Ratio 13 12 - 28   Sodium 140 134 - 144 mmol/L   Potassium 4.1 3.5 - 5.2 mmol/L   Chloride 100 96 - 106 mmol/L   CO2 23 20 - 29 mmol/L   Calcium  10.0 8.7 - 10.3 mg/dL   Total Protein 8.1 6.0 - 8.5 g/dL   Albumin 4.9 3.9 - 4.9 g/dL   Globulin, Total 3.2 1.5 - 4.5 g/dL   Bilirubin Total 0.3 0.0 - 1.2 mg/dL   Alkaline Phosphatase 94 49 - 135 IU/L   AST 50 (H) 0 - 40 IU/L   ALT 63 (H) 0 - 32 IU/L  Lipid panel     Status: Abnormal   Collection Time: 11/24/23 10:39 AM  Result Value Ref Range   Cholesterol, Total 273 (H) 100 - 199 mg/dL   Triglycerides 721 (H) 0 - 149 mg/dL   HDL 45 >60 mg/dL   VLDL Cholesterol Cal 53 (H) 5 - 40 mg/dL   LDL Chol Calc (NIH) 824 (H) 0 - 99 mg/dL   Chol/HDL Ratio 6.1 (H) 0.0 - 4.4 ratio    Comment:                                   T. Chol/HDL Ratio                                             Men  Women                               1/2 Avg.Risk  3.4    3.3                                   Avg.Risk  5.0    4.4                                2X Avg.Risk  9.6    7.1                                3X Avg.Risk 23.4   11.0   VITAMIN D  25 Hydroxy (Vit-D Deficiency, Fractures)     Status: Abnormal   Collection Time: 11/24/23 10:39 AM  Result Value Ref Range   Vit D, 25-Hydroxy 21.5 (L) 30.0 - 100.0 ng/mL    Comment:  Vitamin D  deficiency has been defined by the Institute of Medicine and an Endocrine Society practice guideline as a level of serum 25-OH vitamin D  less than 20 ng/mL (1,2). The Endocrine Society went on to further define vitamin D  insufficiency as a level between 21 and 29 ng/mL (2). 1. IOM (Institute of Medicine). 2010. Dietary reference  intakes for calcium  and D. [place]: The    [employer]. 2. Holick MF, Binkley [place], Bischoff-Ferrari HA, et al.    Evaluation, treatment, and prevention of vitamin D     deficiency: an Endocrine Society clinical practice    guideline. JCEM. 2011 Jul; 96(7):1911-30.   Vitamin B12     Status: None   Collection Time: 11/24/23 10:39 AM  Result Value Ref Range   Vitamin B-12 529 232 - 1,245 pg/mL  CBC with Diff     Status: Abnormal   Collection Time: 11/24/23 10:39 AM  Result Value Ref Range   WBC 6.8 3.4 - 10.8 x10E3/uL   RBC 4.79 3.77 - 5.28 x10E6/uL   Hemoglobin 16.0 (H) 11.1 - 15.9 g/dL   Hematocrit 51.5 (H) 65.9 - 46.6 %   MCV 101 (H) 79 - 97 fL   MCH 33.4 (H) 26.6 - 33.0 pg   MCHC 33.1 31.5 - 35.7 g/dL   RDW 87.3 88.2 - 84.5 %   Platelets 203 150 - 450 x10E3/uL   Neutrophils 64 Not Estab. %   Lymphs 26 Not Estab. %   Monocytes 8 Not Estab. %   Eos 1 Not Estab. %   Basos 1 Not Estab. %   Neutrophils Absolute 4.4 1.4 - 7.0 x10E3/uL   Lymphocytes Absolute 1.8 0.7 - 3.1 x10E3/uL   Monocytes Absolute 0.5 0.1 - 0.9 x10E3/uL   EOS (ABSOLUTE) 0.1 0.0 - 0.4 x10E3/uL   Basophils Absolute 0.1 0.0 - 0.2 x10E3/uL   Immature Granulocytes 0 Not Estab. %   Immature Grans (Abs) 0.0 0.0 - 0.1 x10E3/uL  Hemoglobin A1c     Status: Abnormal   Collection Time: 11/24/23 10:39 AM  Result Value Ref Range   Hgb A1c MFr Bld 10.5 (H) 4.8 - 5.6 %    Comment:          Prediabetes: 5.7 - 6.4          Diabetes: >6.4          Glycemic control for adults with diabetes: <7.0    Est. average glucose Bld gHb Est-mCnc 255 mg/dL  Iron , TIBC and  Ferritin Panel     Status: Abnormal   Collection Time: 11/24/23 10:39 AM  Result Value Ref Range   Total Iron  Binding Capacity 341 250 - 450 ug/dL   UIBC 720 881 - 630 ug/dL   Iron  62 27 - 139 ug/dL   Iron  Saturation 18 15 - 55 %   Ferritin 185 (H) 15 - 150 ng/mL  Parathyroid  Hormone, Intact w/Ca     Status: Abnormal   Collection Time: 11/24/23 10:39 AM  Result Value Ref Range   PTH 69 (H) 15 - 65 pg/mL   PTH Interp Comment     Comment: Interpretation                 Intact PTH    Calcium                                  (pg/mL)      (mg/dL) Normal                          15 - 65     8.6 - 10.2 Primary Hyperparathyroidism         >65          >10.2 Secondary Hyperparathyroidism       >  65          <10.2 Non-Parathyroid  Hypercalcemia       <65          >10.2 Hypoparathyroidism                  <15          < 8.6 Non-Parathyroid  Hypocalcemia    15 - 65          < 8.6   TSH+T4F+T3Free     Status: Abnormal   Collection Time: 11/24/23 10:39 AM  Result Value Ref Range   TSH 12.000 (H) 0.450 - 4.500 uIU/mL   T3, Free 2.1 2.0 - 4.4 pg/mL   Free T4 1.12 0.82 - 1.77 ng/dL  Prolactin     Status: None   Collection Time: 11/24/23 10:39 AM  Result Value Ref Range   Prolactin 6.2 3.6 - 25.2 ng/mL  Insulin -like growth factor     Status: None   Collection Time: 11/24/23 10:39 AM  Result Value Ref Range   Insulin -Like GF-1 121 57 - 202 ng/mL  FSH/LH     Status: None   Collection Time: 11/24/23 10:39 AM  Result Value Ref Range   LH 23.6 7.7 - 58.5 mIU/mL    Comment:                      Adult Female              Range                       Follicular phase      2.4 -  12.6                       Ovulation phase      14.0 -  95.6                       Luteal phase          1.0 -  11.4                       Postmenopausal        7.7 -  58.5    FSH 42.3 25.8 - 134.8 mIU/mL    Comment:                      Adult Female             Range                       Follicular phase      3.5 -  12.5                        Ovulation phase       4.7 -  21.5                       Luteal phase          1.7 -   7.7                       Postmenopausal       25.8 - 134.8   Estradiol      Status: None   Collection Time: 11/24/23 10:39 AM  Result Value Ref Range  Estradiol  <5.0 0.0 - 54.7 pg/mL    Comment:                      Adult Female             Range                       Follicular phase     12.5 - 166.0                       Ovulation phase      85.8 - 498.0                       Luteal phase         43.8 - 211.0                       Postmenopausal       <6.0 -  54.7                      Pregnancy                       1st trimester     215.0 - >4300.0 Roche ECLIA methodology   T3     Status: None   Collection Time: 11/24/23 10:39 AM  Result Value Ref Range   T3, Total 88 71 - 180 ng/dL  Troponin T     Status: Abnormal   Collection Time: 11/24/23 10:54 AM  Result Value Ref Range   Troponin T (Highly Sensitive) 36 (HH) 0 - 14 ng/L    Comment: In order to distinguish acute elevations of high sensitive Troponin from other clinical conditions, the Universal Definition of myocardial infarction stresses clinical assessment and the need for serial measurements to observe a rise and/or fall above the upper limit of the reference interval.   Brain natriuretic peptide     Status: None   Collection Time: 11/24/23 11:01 AM  Result Value Ref Range   BNP 54.0 0.0 - 100.0 pg/mL    Comment: Siemens ADVIA Centaur XP methodology       Assessment & Plan:   Assessment & Plan B12 deficiency due to diet  Mixed hyperlipidemia  Type 2 diabetes mellitus with chronic kidney disease, without long-term current use of insulin , unspecified CKD stage (HCC)  Breast tenderness  Abnormal results of thyroid  function studies     No follow-ups on file.   Total time spent: {AMA time spent:29001} minutes  ALAN [name] ARRANT, FNP  02/04/2024   This document may have been prepared by [facility]  Voice Recognition software and as such may include unintentional dictation errors.       [1] Allergies Allergen Reactions   Shellfish Allergy Anaphylaxis and Nausea And Vomiting    Topical betadine  is OK to use.   Statins Other (See Comments)    Muscle pain/aches, can tolerate rosuvastatin   "

## 2024-02-05 LAB — COMPREHENSIVE METABOLIC PANEL WITH GFR
ALT: 39 IU/L — ABNORMAL HIGH (ref 0–32)
AST: 26 IU/L (ref 0–40)
Albumin: 4.8 g/dL (ref 3.9–4.9)
Alkaline Phosphatase: 109 IU/L (ref 49–135)
BUN/Creatinine Ratio: 9 — ABNORMAL LOW (ref 12–28)
BUN: 13 mg/dL (ref 8–27)
Bilirubin Total: 0.4 mg/dL (ref 0.0–1.2)
CO2: 25 mmol/L (ref 20–29)
Calcium: 9.9 mg/dL (ref 8.7–10.3)
Chloride: 98 mmol/L (ref 96–106)
Creatinine, Ser: 1.44 mg/dL — ABNORMAL HIGH (ref 0.57–1.00)
Globulin, Total: 3.1 g/dL (ref 1.5–4.5)
Glucose: 123 mg/dL — ABNORMAL HIGH (ref 70–99)
Potassium: 4.3 mmol/L (ref 3.5–5.2)
Sodium: 140 mmol/L (ref 134–144)
Total Protein: 7.9 g/dL (ref 6.0–8.5)
eGFR: 41 mL/min/1.73 — ABNORMAL LOW

## 2024-02-05 LAB — TSH: TSH: 44.1 u[IU]/mL — ABNORMAL HIGH (ref 0.450–4.500)

## 2024-02-06 ENCOUNTER — Encounter: Payer: Self-pay | Admitting: Family

## 2024-02-06 NOTE — Assessment & Plan Note (Signed)
 Continue current therapy for lipid control. Will modify as needed based on labwork results.

## 2024-02-06 NOTE — Assessment & Plan Note (Signed)
 Checking labs today. Will call pt. With results  Continue current diabetes POC, as patient has been well controlled on current regimen.  Will adjust meds if needed based on labs.

## 2024-02-06 NOTE — Assessment & Plan Note (Signed)
 Rechecking TSH today.  Will call with results when available.

## 2024-02-07 ENCOUNTER — Ambulatory Visit: Payer: Self-pay

## 2024-02-07 ENCOUNTER — Other Ambulatory Visit: Payer: Self-pay

## 2024-02-10 ENCOUNTER — Other Ambulatory Visit: Payer: Self-pay | Admitting: Family

## 2024-02-11 ENCOUNTER — Other Ambulatory Visit: Payer: Self-pay | Admitting: Family

## 2024-03-01 ENCOUNTER — Other Ambulatory Visit: Payer: Self-pay | Admitting: Family

## 2024-03-01 DIAGNOSIS — J3089 Other allergic rhinitis: Secondary | ICD-10-CM

## 2024-03-14 ENCOUNTER — Ambulatory Visit: Admitting: Family
